# Patient Record
Sex: Male | Born: 1935 | Race: White | Hispanic: No | Marital: Married | Smoking: Never smoker
Health system: Southern US, Community
[De-identification: ages and names within clinical notes are randomized; demographics above are authoritative.]

## PROBLEM LIST (undated history)

## (undated) DIAGNOSIS — I4891 Unspecified atrial fibrillation: Secondary | ICD-10-CM

## (undated) DIAGNOSIS — I639 Cerebral infarction, unspecified: Secondary | ICD-10-CM

## (undated) DIAGNOSIS — I499 Cardiac arrhythmia, unspecified: Secondary | ICD-10-CM

## (undated) DIAGNOSIS — E119 Type 2 diabetes mellitus without complications: Secondary | ICD-10-CM

## (undated) DIAGNOSIS — R413 Other amnesia: Secondary | ICD-10-CM

## (undated) DIAGNOSIS — I472 Ventricular tachycardia, unspecified: Secondary | ICD-10-CM

## (undated) DIAGNOSIS — H9190 Unspecified hearing loss, unspecified ear: Secondary | ICD-10-CM

## (undated) DIAGNOSIS — S065X9A Traumatic subdural hemorrhage with loss of consciousness of unspecified duration, initial encounter: Secondary | ICD-10-CM

## (undated) DIAGNOSIS — E785 Hyperlipidemia, unspecified: Secondary | ICD-10-CM

## (undated) DIAGNOSIS — I509 Heart failure, unspecified: Secondary | ICD-10-CM

## (undated) DIAGNOSIS — R51 Headache: Secondary | ICD-10-CM

## (undated) DIAGNOSIS — N400 Enlarged prostate without lower urinary tract symptoms: Secondary | ICD-10-CM

## (undated) DIAGNOSIS — F0390 Unspecified dementia without behavioral disturbance: Secondary | ICD-10-CM

## (undated) DIAGNOSIS — Z95810 Presence of automatic (implantable) cardiac defibrillator: Secondary | ICD-10-CM

## (undated) DIAGNOSIS — I2510 Atherosclerotic heart disease of native coronary artery without angina pectoris: Secondary | ICD-10-CM

## (undated) DIAGNOSIS — I6529 Occlusion and stenosis of unspecified carotid artery: Secondary | ICD-10-CM

## (undated) DIAGNOSIS — I10 Essential (primary) hypertension: Secondary | ICD-10-CM

## (undated) DIAGNOSIS — M1990 Unspecified osteoarthritis, unspecified site: Secondary | ICD-10-CM

## (undated) DIAGNOSIS — R519 Headache, unspecified: Secondary | ICD-10-CM

## (undated) DIAGNOSIS — Z950 Presence of cardiac pacemaker: Secondary | ICD-10-CM

## (undated) HISTORY — DX: Heart failure, unspecified: I50.9

## (undated) HISTORY — DX: Ventricular tachycardia, unspecified: I47.20

## (undated) HISTORY — DX: Benign prostatic hyperplasia without lower urinary tract symptoms: N40.0

## (undated) HISTORY — DX: Atherosclerotic heart disease of native coronary artery without angina pectoris: I25.10

## (undated) HISTORY — DX: Occlusion and stenosis of unspecified carotid artery: I65.29

## (undated) HISTORY — DX: Cerebral infarction, unspecified: I63.9

## (undated) HISTORY — DX: Unspecified dementia, unspecified severity, without behavioral disturbance, psychotic disturbance, mood disturbance, and anxiety: F03.90

## (undated) HISTORY — DX: Traumatic subdural hemorrhage with loss of consciousness of unspecified duration, initial encounter: S06.5X9A

## (undated) HISTORY — DX: Type 2 diabetes mellitus without complications: E11.9

## (undated) HISTORY — PX: HEMORRHOID SURGERY: SHX153

## (undated) HISTORY — DX: Unspecified hearing loss, unspecified ear: H91.90

## (undated) HISTORY — DX: Other amnesia: R41.3

## (undated) HISTORY — DX: Unspecified atrial fibrillation: I48.91

## (undated) HISTORY — DX: Essential (primary) hypertension: I10

## (undated) HISTORY — DX: Hyperlipidemia, unspecified: E78.5

## (undated) HISTORY — PX: PACEMAKER INSERTION: SHX728

## (undated) HISTORY — DX: Gilbert syndrome: E80.4

## (undated) HISTORY — DX: Ventricular tachycardia: I47.2

---

## 1996-01-10 HISTORY — PX: CORONARY ARTERY BYPASS GRAFT: SHX141

## 1997-06-22 ENCOUNTER — Other Ambulatory Visit: Admission: RE | Admit: 1997-06-22 | Discharge: 1997-06-22 | Payer: Self-pay | Admitting: Cardiology

## 2003-01-10 HISTORY — PX: BRAIN SURGERY: SHX531

## 2003-07-23 ENCOUNTER — Encounter (INDEPENDENT_AMBULATORY_CARE_PROVIDER_SITE_OTHER): Payer: Self-pay | Admitting: Cardiology

## 2003-07-23 ENCOUNTER — Ambulatory Visit (HOSPITAL_COMMUNITY): Admission: RE | Admit: 2003-07-23 | Discharge: 2003-07-23 | Payer: Self-pay | Admitting: Cardiology

## 2003-11-10 DIAGNOSIS — S065XAA Traumatic subdural hemorrhage with loss of consciousness status unknown, initial encounter: Secondary | ICD-10-CM

## 2003-11-10 DIAGNOSIS — S065X9A Traumatic subdural hemorrhage with loss of consciousness of unspecified duration, initial encounter: Secondary | ICD-10-CM

## 2003-11-10 HISTORY — DX: Traumatic subdural hemorrhage with loss of consciousness of unspecified duration, initial encounter: S06.5X9A

## 2003-11-10 HISTORY — DX: Traumatic subdural hemorrhage with loss of consciousness status unknown, initial encounter: S06.5XAA

## 2003-11-13 ENCOUNTER — Inpatient Hospital Stay (HOSPITAL_COMMUNITY): Admission: EM | Admit: 2003-11-13 | Discharge: 2003-11-18 | Payer: Self-pay | Admitting: Emergency Medicine

## 2003-11-25 ENCOUNTER — Inpatient Hospital Stay (HOSPITAL_COMMUNITY): Admission: EM | Admit: 2003-11-25 | Discharge: 2003-11-27 | Payer: Self-pay | Admitting: Emergency Medicine

## 2003-11-25 ENCOUNTER — Ambulatory Visit (HOSPITAL_COMMUNITY): Admission: RE | Admit: 2003-11-25 | Discharge: 2003-11-25 | Payer: Self-pay | Admitting: Neurosurgery

## 2003-11-26 ENCOUNTER — Encounter (INDEPENDENT_AMBULATORY_CARE_PROVIDER_SITE_OTHER): Payer: Self-pay | Admitting: Cardiology

## 2003-12-04 ENCOUNTER — Inpatient Hospital Stay (HOSPITAL_COMMUNITY): Admission: EM | Admit: 2003-12-04 | Discharge: 2003-12-15 | Payer: Self-pay | Admitting: Emergency Medicine

## 2003-12-04 ENCOUNTER — Encounter (INDEPENDENT_AMBULATORY_CARE_PROVIDER_SITE_OTHER): Payer: Self-pay | Admitting: *Deleted

## 2003-12-15 ENCOUNTER — Ambulatory Visit: Payer: Self-pay | Admitting: Physical Medicine & Rehabilitation

## 2003-12-15 ENCOUNTER — Inpatient Hospital Stay (HOSPITAL_COMMUNITY)
Admission: RE | Admit: 2003-12-15 | Discharge: 2003-12-25 | Payer: Self-pay | Admitting: Physical Medicine & Rehabilitation

## 2004-01-16 ENCOUNTER — Emergency Department (HOSPITAL_COMMUNITY): Admission: EM | Admit: 2004-01-16 | Discharge: 2004-01-17 | Payer: Self-pay | Admitting: Emergency Medicine

## 2004-01-27 ENCOUNTER — Ambulatory Visit (HOSPITAL_COMMUNITY): Admission: RE | Admit: 2004-01-27 | Discharge: 2004-01-27 | Payer: Self-pay | Admitting: Neurological Surgery

## 2004-02-01 ENCOUNTER — Encounter
Admission: RE | Admit: 2004-02-01 | Discharge: 2004-04-28 | Payer: Self-pay | Admitting: Physical Medicine & Rehabilitation

## 2004-02-03 ENCOUNTER — Ambulatory Visit: Payer: Self-pay | Admitting: Physical Medicine & Rehabilitation

## 2004-02-17 ENCOUNTER — Encounter
Admission: RE | Admit: 2004-02-17 | Discharge: 2004-03-09 | Payer: Self-pay | Admitting: Physical Medicine & Rehabilitation

## 2004-04-28 ENCOUNTER — Encounter
Admission: RE | Admit: 2004-04-28 | Discharge: 2004-07-27 | Payer: Self-pay | Admitting: Physical Medicine & Rehabilitation

## 2004-05-02 ENCOUNTER — Ambulatory Visit: Payer: Self-pay | Admitting: Physical Medicine & Rehabilitation

## 2004-07-29 ENCOUNTER — Ambulatory Visit: Payer: Self-pay | Admitting: Physical Medicine & Rehabilitation

## 2004-07-29 ENCOUNTER — Encounter
Admission: RE | Admit: 2004-07-29 | Discharge: 2004-10-27 | Payer: Self-pay | Admitting: Physical Medicine & Rehabilitation

## 2006-04-23 IMAGING — CT CT HEAD W/O CM
1 of 2 series · 13 of 30 positions shown, 17 images · IV contrast (agent unspecified)
Comparison: none

CLINICAL DATA: Subdural hemorrhage; nontraumatic
 HEAD CT WITHOUT CONTRAST:
 Comparison none. 
 A right extraaxial surgical drain is noted in place.  There are bilateral extraaxial fluid collections, left more so than right having features consistent with acute on chronic subdural hematomas.  Approximately 2 mm of left to right midline shift is apparent on the current study.  Cerebellar atrophy noted.  There is no evidence for hydrocephalus.  No definite CT evidence of acute ischemia is identified.

[Series 2: brain · axial · 0.47mm/px · z∈[+145,+279]mm · 13 of 48 slices shown, 17 images]
[im 4/48  brain]
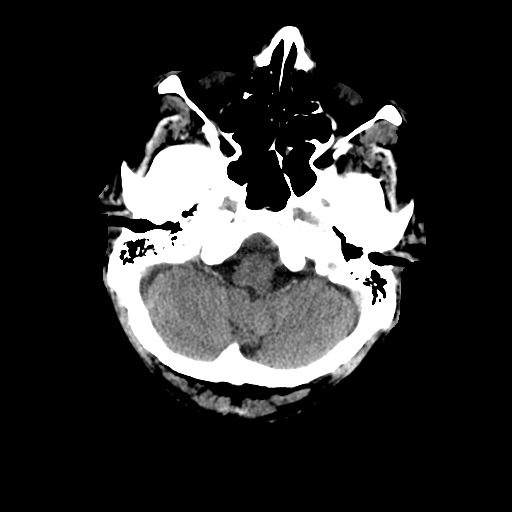
[im 4/48  bone]
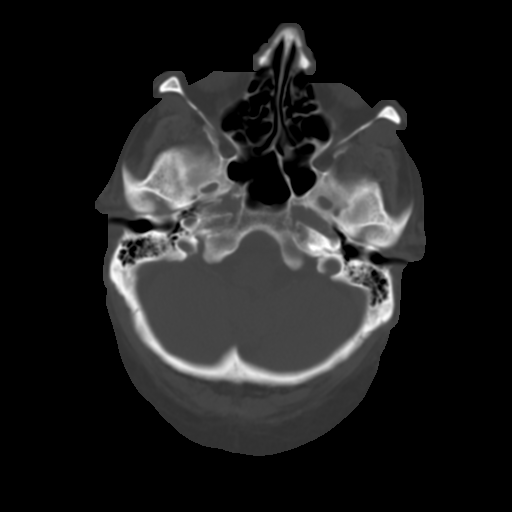
[im 7/48  brain]
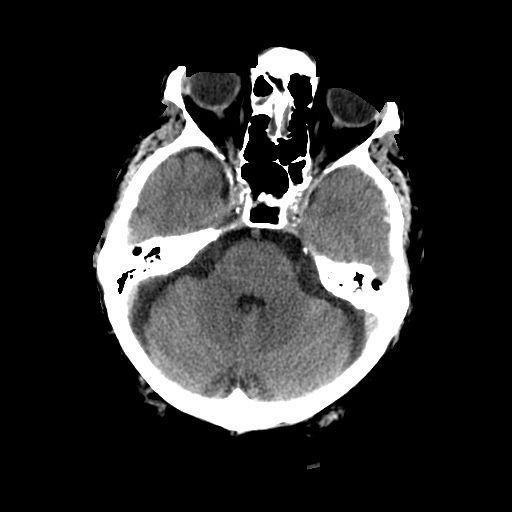
[im 11/48  brain]
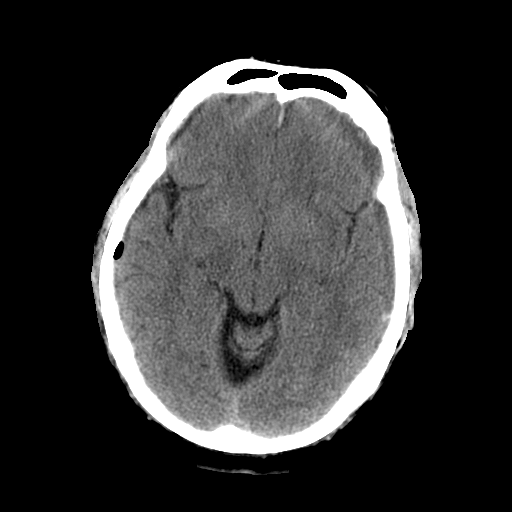
[im 14/48  brain]
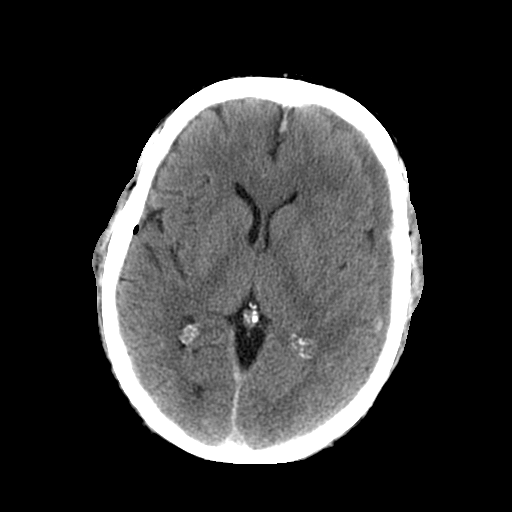
[im 17/48  brain]
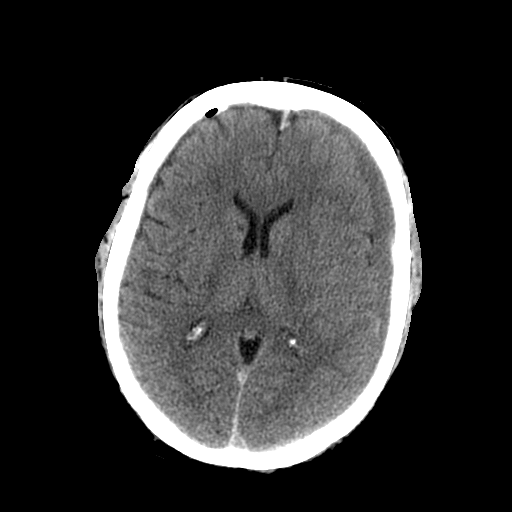
[im 17/48  bone]
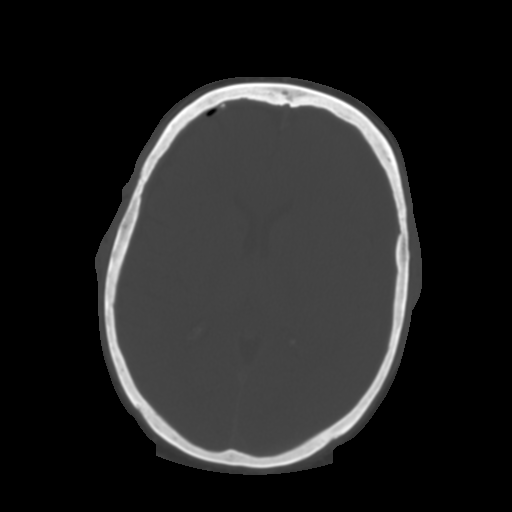
[im 21/48  brain]
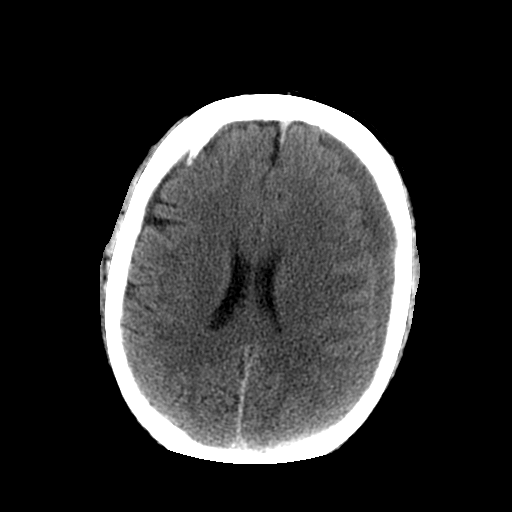
[im 24/48  brain]
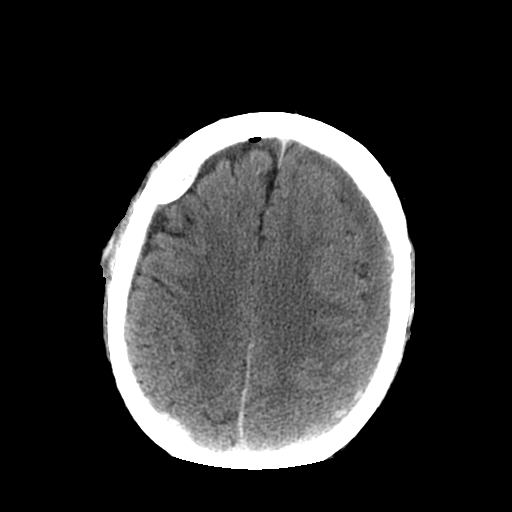
[im 27/48  brain]
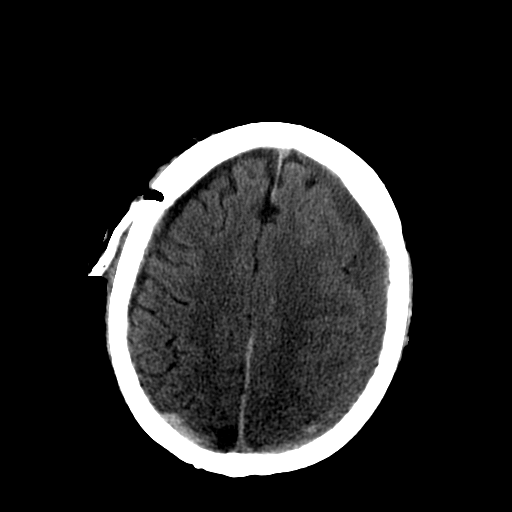
[im 31/48  brain]
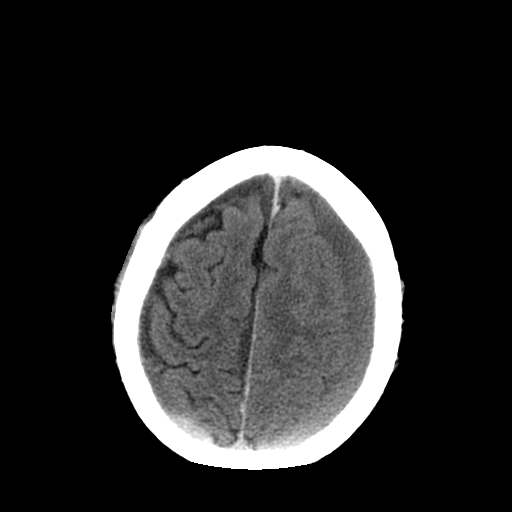
[im 31/48  bone]
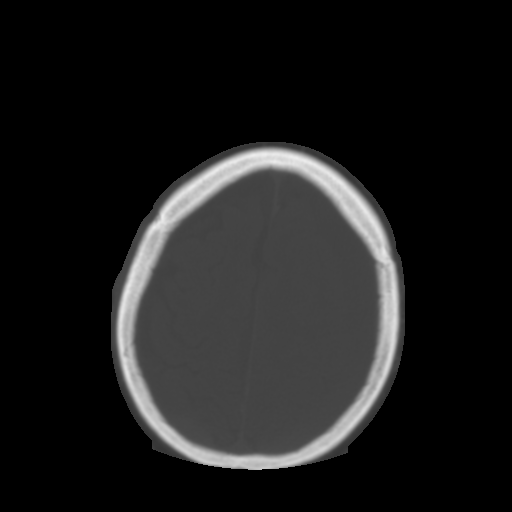
[im 34/48  brain]
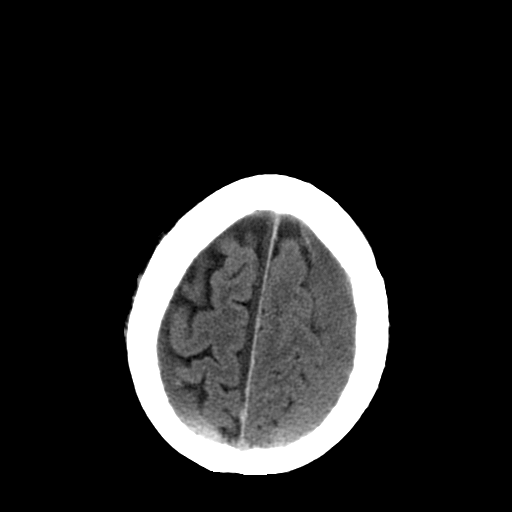
[im 37/48  brain]
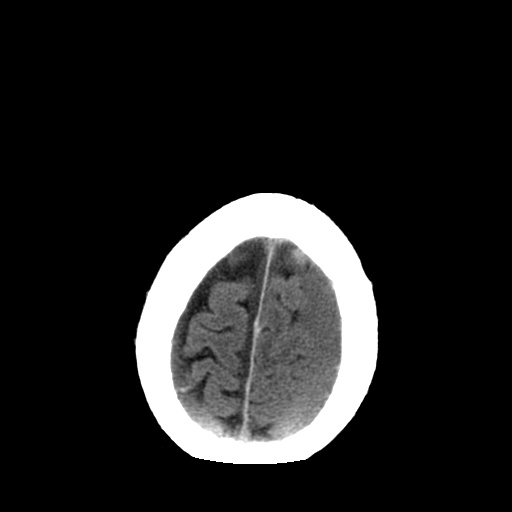
[im 41/48  brain]
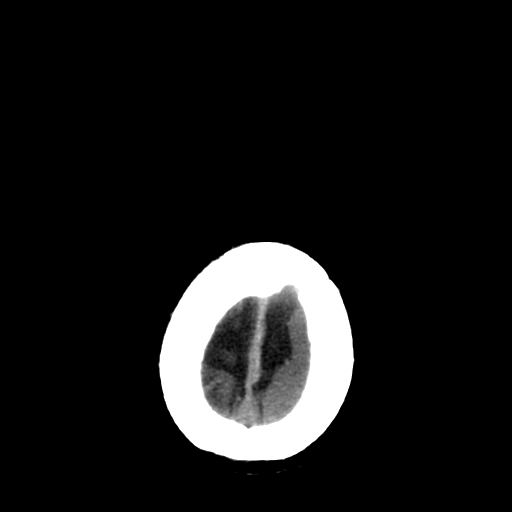
[im 44/48  brain]
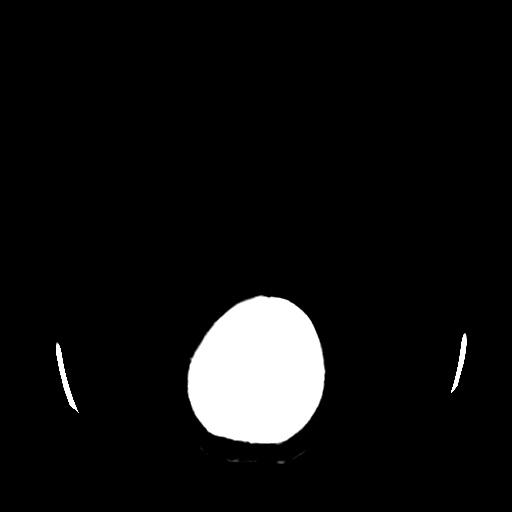
[im 44/48  bone]
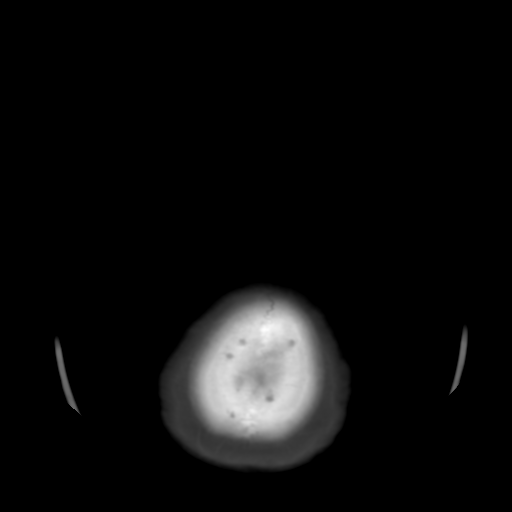

[13 of 30 positions shown; findings below may reference images not displayed]

IMPRESSION: 1.  Bilateral subdural hematomas having acute on chronic features.  There has been surgical drain placement in the right hematoma.  
 2.  Approximately 2 mm of left to right midline shift.

## 2006-04-24 IMAGING — CT CT HEAD W/O CM
1 of 2 series · 13 of 30 positions shown, 17 images · non-contrast
Comparison: 11/17/03.

CLINICAL DATA: Subdural hematoma.

[Series 2: brain · axial · 0.47mm/px · z∈[+169,+309]mm · 13 of 32 slices shown, 17 images]
[im 3/32  brain]
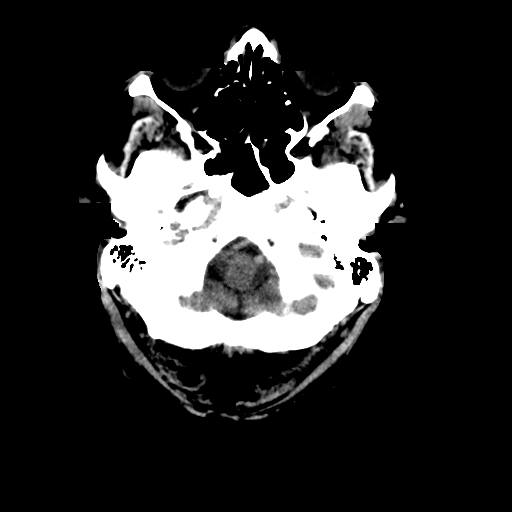
[im 3/32  bone]
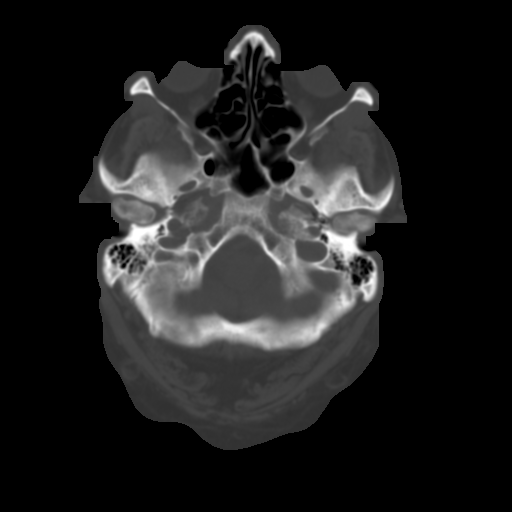
[im 5/32  brain]
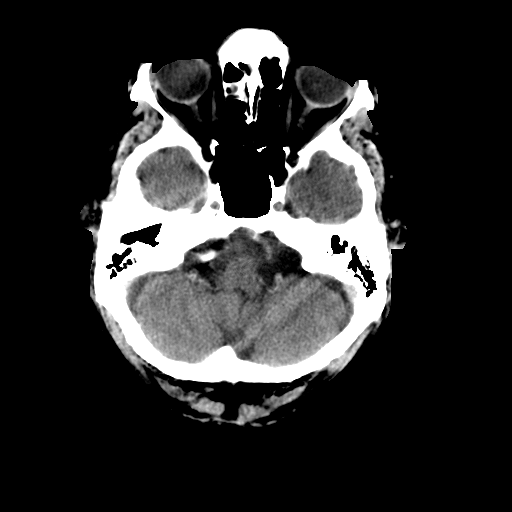
[im 7/32  brain]
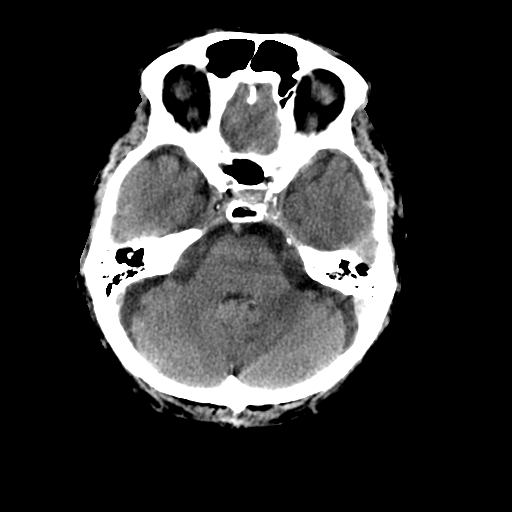
[im 9/32  brain]
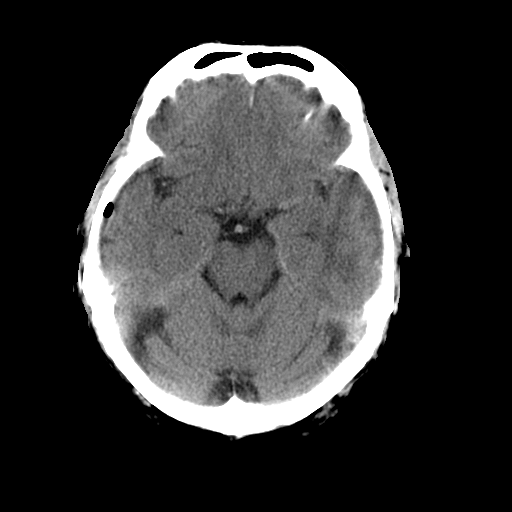
[im 12/32  brain]
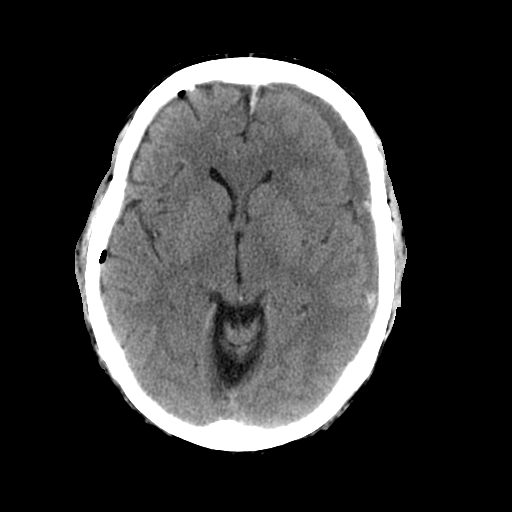
[im 12/32  bone]
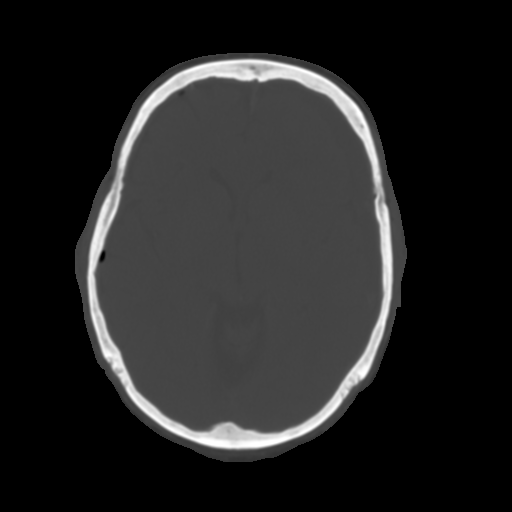
[im 14/32  brain]
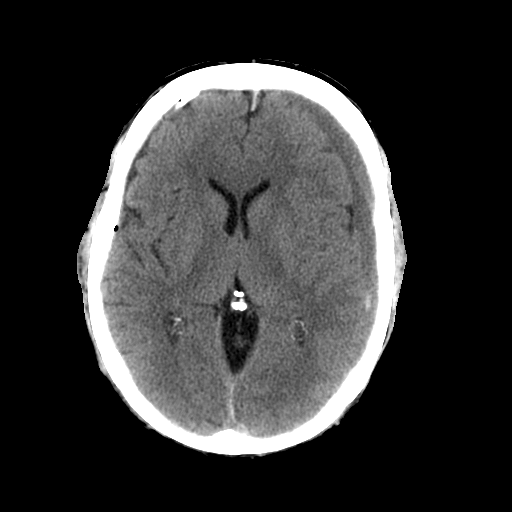
[im 16/32  brain]
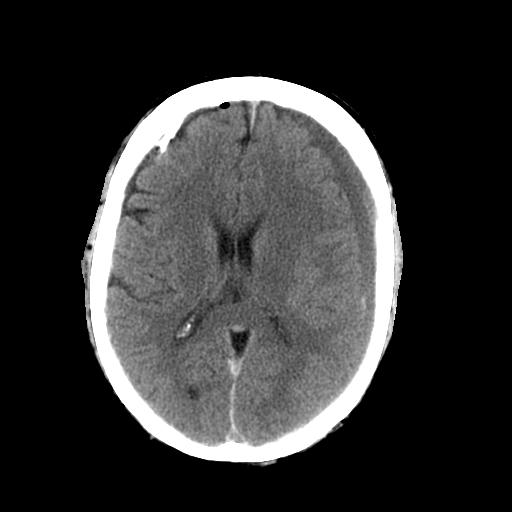
[im 18/32  brain]
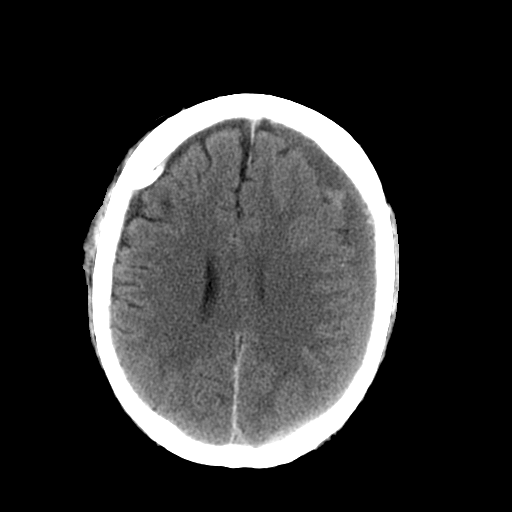
[im 20/32  brain]
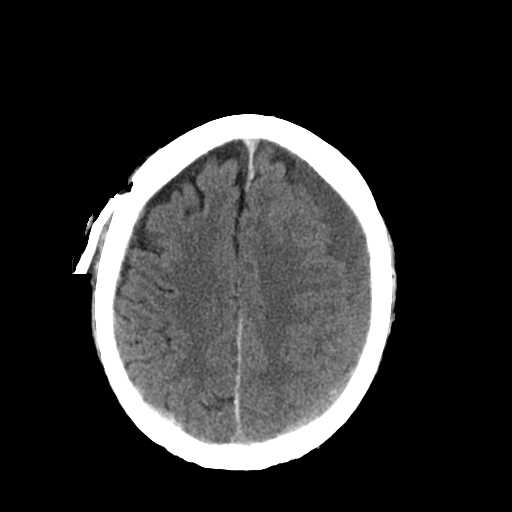
[im 20/32  bone]
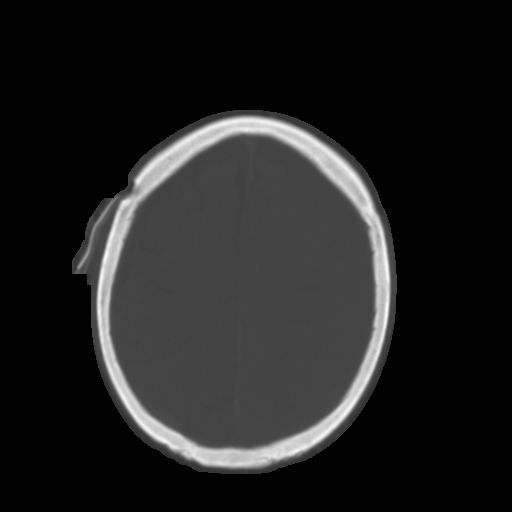
[im 23/32  brain]
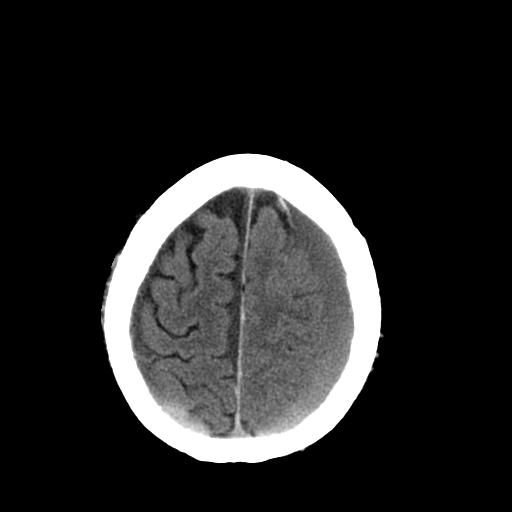
[im 25/32  brain]
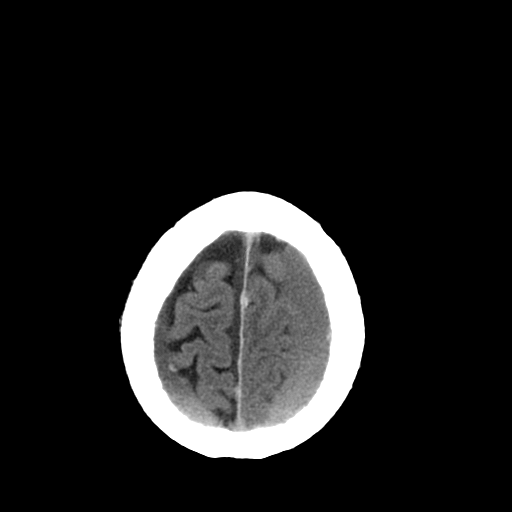
[im 27/32  brain]
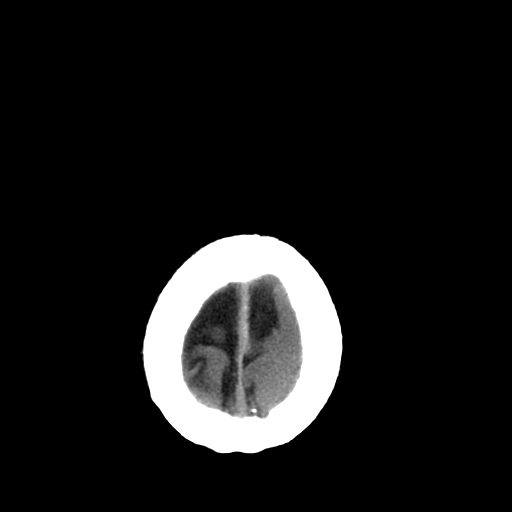
[im 29/32  brain]
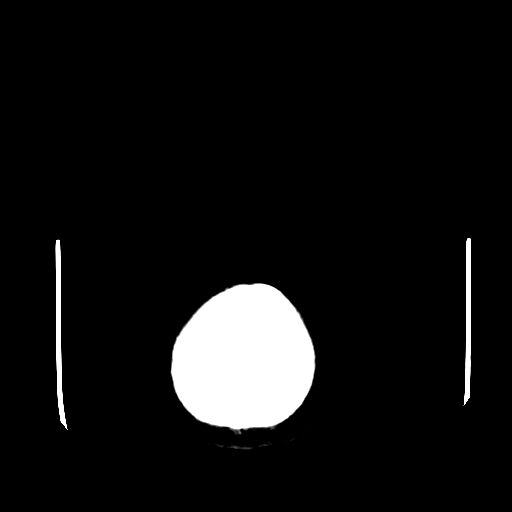
[im 29/32  bone]
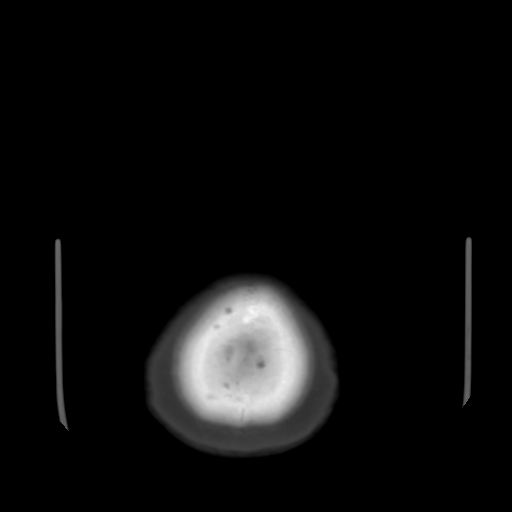

[13 of 30 positions shown; findings below may reference images not displayed]

CT HEAD W/O CONTRAST:
 Routine uninfused CT scanning of the brain shows the right subdural drain still in place.  A tiny amount of subdural fluid on the right is stable. Acute on chronic left-sided subdural hematoma extends over the convexity but demonstrates no substantial change in the interval.  The mild left to right midline shift is stable.
 No evidence for new  hemorrhage or CT evidence for acute ischemia.
IMPRESSION: No substantial change in exam.

## 2008-01-29 ENCOUNTER — Encounter: Admission: RE | Admit: 2008-01-29 | Discharge: 2008-01-29 | Payer: Self-pay | Admitting: Internal Medicine

## 2008-09-25 ENCOUNTER — Ambulatory Visit: Payer: Self-pay | Admitting: Internal Medicine

## 2008-09-25 ENCOUNTER — Inpatient Hospital Stay (HOSPITAL_COMMUNITY): Admission: EM | Admit: 2008-09-25 | Discharge: 2008-10-03 | Payer: Self-pay | Admitting: Emergency Medicine

## 2008-09-26 ENCOUNTER — Encounter (INDEPENDENT_AMBULATORY_CARE_PROVIDER_SITE_OTHER): Payer: Self-pay | Admitting: Cardiology

## 2008-10-01 ENCOUNTER — Encounter: Payer: Self-pay | Admitting: Internal Medicine

## 2008-10-08 ENCOUNTER — Encounter: Payer: Self-pay | Admitting: Internal Medicine

## 2008-10-14 ENCOUNTER — Ambulatory Visit: Payer: Self-pay

## 2008-10-14 ENCOUNTER — Encounter: Payer: Self-pay | Admitting: Internal Medicine

## 2008-10-22 ENCOUNTER — Ambulatory Visit (HOSPITAL_BASED_OUTPATIENT_CLINIC_OR_DEPARTMENT_OTHER): Admission: RE | Admit: 2008-10-22 | Discharge: 2008-10-22 | Payer: Self-pay | Admitting: Internal Medicine

## 2008-10-24 ENCOUNTER — Ambulatory Visit: Payer: Self-pay | Admitting: Internal Medicine

## 2008-11-09 ENCOUNTER — Encounter: Payer: Self-pay | Admitting: Internal Medicine

## 2008-11-14 ENCOUNTER — Emergency Department (HOSPITAL_COMMUNITY): Admission: EM | Admit: 2008-11-14 | Discharge: 2008-11-14 | Payer: Self-pay | Admitting: Emergency Medicine

## 2009-01-12 ENCOUNTER — Ambulatory Visit: Payer: Self-pay | Admitting: Internal Medicine

## 2009-01-12 DIAGNOSIS — I10 Essential (primary) hypertension: Secondary | ICD-10-CM | POA: Insufficient documentation

## 2009-01-12 DIAGNOSIS — I472 Ventricular tachycardia: Secondary | ICD-10-CM

## 2009-01-12 DIAGNOSIS — I495 Sick sinus syndrome: Secondary | ICD-10-CM | POA: Insufficient documentation

## 2009-01-12 DIAGNOSIS — I2589 Other forms of chronic ischemic heart disease: Secondary | ICD-10-CM

## 2009-01-12 DIAGNOSIS — E119 Type 2 diabetes mellitus without complications: Secondary | ICD-10-CM | POA: Insufficient documentation

## 2009-01-13 ENCOUNTER — Telehealth: Payer: Self-pay | Admitting: Internal Medicine

## 2009-01-14 ENCOUNTER — Telehealth (INDEPENDENT_AMBULATORY_CARE_PROVIDER_SITE_OTHER): Payer: Self-pay | Admitting: *Deleted

## 2009-01-14 ENCOUNTER — Telehealth: Payer: Self-pay | Admitting: Internal Medicine

## 2009-01-14 LAB — CONVERTED CEMR LAB
AST: 29 units/L (ref 0–37)
Albumin: 3.5 g/dL (ref 3.5–5.2)
TSH: 1.76 microintl units/mL (ref 0.35–5.50)
Total Bilirubin: 1.5 mg/dL — ABNORMAL HIGH (ref 0.3–1.2)
Total Protein: 7 g/dL (ref 6.0–8.3)

## 2009-02-09 ENCOUNTER — Encounter: Payer: Self-pay | Admitting: Internal Medicine

## 2009-02-21 ENCOUNTER — Inpatient Hospital Stay (HOSPITAL_COMMUNITY): Admission: EM | Admit: 2009-02-21 | Discharge: 2009-02-27 | Payer: Self-pay | Admitting: Emergency Medicine

## 2009-02-22 ENCOUNTER — Encounter (INDEPENDENT_AMBULATORY_CARE_PROVIDER_SITE_OTHER): Payer: Self-pay | Admitting: Neurology

## 2009-02-25 ENCOUNTER — Ambulatory Visit: Payer: Self-pay | Admitting: Physical Medicine & Rehabilitation

## 2009-02-27 ENCOUNTER — Inpatient Hospital Stay (HOSPITAL_COMMUNITY)
Admission: EM | Admit: 2009-02-27 | Discharge: 2009-03-06 | Payer: Self-pay | Admitting: Physical Medicine & Rehabilitation

## 2009-02-27 ENCOUNTER — Ambulatory Visit: Payer: Self-pay | Admitting: Physical Medicine & Rehabilitation

## 2009-04-07 ENCOUNTER — Telehealth: Payer: Self-pay | Admitting: Internal Medicine

## 2009-06-11 ENCOUNTER — Encounter: Admission: RE | Admit: 2009-06-11 | Discharge: 2009-06-11 | Payer: Self-pay | Admitting: Neurology

## 2009-09-14 ENCOUNTER — Ambulatory Visit: Payer: Self-pay | Admitting: Internal Medicine

## 2010-02-08 NOTE — Cardiovascular Report (Signed)
Summary: Office Visit   Office Visit   Imported By: Roderic Ovens 09/21/2009 11:16:18  _____________________________________________________________________  External Attachment:    Type:   Image     Comment:   External Document

## 2010-02-08 NOTE — Progress Notes (Signed)
Summary: returning call  Phone Note Call from Patient Call back at Home Phone [phone]   Caller: Spouse Reason for Call: Talk to Nurse Summary of Call: returning call Initial call taken by: Migdalia Dk,  January 14, 2009 3:14 PM  Follow-up for Phone Call        Pt aware of lab results

## 2010-02-08 NOTE — Cardiovascular Report (Signed)
Summary: Office Visit   Office Visit   Imported By: Roderic Ovens 01/21/2009 10:25:55  _____________________________________________________________________  External Attachment:    Type:   Image     Comment:   External Document

## 2010-02-08 NOTE — Consult Note (Signed)
Summary: Dr. Leeroy [facility]ock [facility]  Dr[facility]er[facility]   Imported By: Marylou Mccoy 06/08/2009 16:17:44  _____________________________________________________________________  External Attachment:    Type:   Image     Comment:   External Document

## 2010-02-08 NOTE — Progress Notes (Signed)
Summary: MED  Phone Note Call from Patient Call back at Home Phone [phone]   Caller: Spouse Reason for Call: Talk to Nurse Summary of Call: PT IS ON COREG 3.125 MG, SPOKE WITH NURSE YESTERDAY AND SHE NEEDED TO KNOW STRENGTH  Initial call taken by: Migdalia Dk,  January 13, 2009 10:07 AM  Follow-up for Phone Call        I called the pt's wife and let her know we had recieved the message. I will forward to [facility]- RN for Dr. Graciela Husbands in case she needed this info.  Follow-up by: Sherri Rad, RN, BSN,  January 13, 2009 10:22 AM

## 2010-02-08 NOTE — Progress Notes (Signed)
Summary: refill==dosage change  Phone Note Refill Request Call back at Home Phone [phone] Message from:  Patient  Refills Requested: Medication #1:  COREG 6.25 MG TABS two times a day   Supply Requested: 1 month [facility] [address] [place]  Patient states there was a recent change in dosage which needs to be addressed   Method Requested: Telephone to Pharmacy Initial call taken by: Burnard Leigh,  April 07, 2009 11:21 AM  Follow-up for Phone Call        per pt OV 01/12/2009 pt titrated up from 3.25mg  two times a day to 6.25 two times a day.  Change already updated.  Rx refilled Follow-up by: Judithe Modest CMA,  April 08, 2009 8:45 AM    Prescriptions: COREG 6.25 MG TABS (CARVEDILOL) two times a day  #60 x 6   Entered by:   Judithe Modest CMA   Authorized by:   Nathen May, MD, [facility]   Signed by:   Judithe Modest CMA on 04/08/2009   Method used:   Electronically to        [facility] [address] [id number] (retail)       [address] [place], [place]  [address]       Ph: [phone]       Fax: [phone]   RxID:   [id number]

## 2010-02-08 NOTE — Progress Notes (Signed)
  Phone Note Outgoing Call   Call placed by: Duncan Dull, RN, BSN,  January 14, 2009 11:14 AM Call placed to: Patient Summary of Call: Called patient and left message on machine  To discuss labs Duncan Dull, RN, BSN  January 14, 2009 11:14 AM   Follow-up for Phone Call        Pt aware of results

## 2010-02-08 NOTE — Assessment & Plan Note (Signed)
Summary: pc2 guidant/sl   CC:  pacer check.  History of Present Illness:     [name]  is seen in followup for ventricular tachycardia and bradycardia in the setting of permanent atrial fibrillation. He is status post CRT-P. implantation. He has ischemic heart disease with prior bypass grafting with patent LIMA and a patent vein graft.  he was recently hospitalized following a syncopal episode. Upon EMS arrival he was noted to have wide complex tachycardia. Require some resuscitative effort he was brought to hospital. Ultimately amiodarone and beta blockers were added following insertion of a biventricular pacemaker.  he has tolerated these medications since he had been home. He has had a couple stumbles and slipped on the ice but no recurrent syncope. He has had no nausea his breathing is stable and has been no peripheral edema or chest pain. He does not recall having seen Dr. telemetry following discharge (which I'm sure he has).  Current Medications (verified): 1)  Triamterene-Hctz 37.5-25 Mg Tabs (Triamterene-Hctz) .... One Half Tuesday and Saturday Only 2)  Metformin Hcl 500 Mg Tabs (Metformin Hcl) .... One By Mouth Daily 3)  Folic Acid 1 Mg Tabs (Folic Acid) .... One By Mouth Two Times A Day 4)  Vytorin 10-40 Mg Tabs (Ezetimibe-Simvastatin) .... One Half By Mouth Daily 5)  Amiodarone Hcl 200 Mg Tabs (Amiodarone Hcl) .... Once Daily 6)  Coreg 3.125 Mg Tabs (Carvedilol) .... One By Mouth Two Times A Day 7)  Aspir-Low 81 Mg Tbec (Aspirin) .... One By Mouth Daily 8)  Fosamax 70 Mg Tabs (Alendronate Sodium) .... One By Mouth Weekly 9)  Eye Vitamins  Caps (Multiple Vitamins-Minerals) .... One By Mouth Two Times A Day 10)  Vitamin B-12 1000 Mcg Tabs (Cyanocobalamin) .... One By Mouth Daily 11)  Exelon 4.6 Mg/24hr Pt24 (Rivastigmine) .... One Patch Every 24 Hours  Allergies (verified): No Known Drug Allergies  Past History:  Past Medical History: Last updated: 01/12/2009  1. Type 2  diabetes.   2. November 2005 he had a spontaneous subdural hematoma while on       Coumadin then a few days later he did have a small stroke and then       a few days later he had a recurrent subdural hematoma so he had two       craniotomies over the course of this time.  He has not been on       anticoagulation over the last 4 years because of this.  He has been       maintained on aspirin only.   3. He does have permanent atrial fibrillation.   4. Hypertension.   5. Coronary artery disease status post coronary artery bypass grafting       in 1998.   6. Hyperlipidemia.   7. Benign prostatic hypertrophy.   8. History of hemorrhoidectomy.   9. Decreased hearing.   10.Gilbert syndrome.   11.History of a carotid occlusion.   12.Dementia diagnosed in the last year.      Past Surgical History: Last updated: 01/12/2009  Hemorrhoidectomy   Family History: Last updated: 01/12/2009  Mother had coronary artery disease.  Father died of   cancer.   Social History: Last updated: 01/12/2009   He is married.  His wife, Cordelia Pen, and granddaughter are   at the bedside.  He is a full code status and this is confirmed today   with the patient and family members.  No tobacco, no alcohol, no drug  use history.  He is a retired principal.      Vital Signs:  Patient profile:   77 year old male Height:      70 inches Weight:      244 pounds Pulse rate:   71 / minute Pulse rhythm:   regular BP sitting:   139 / 70  (left arm) Cuff size:   large  Vitals Entered By: Judithe Modest CMA (January 12, 2009 3:04 PM)   PPM Specifications Following MD:  Sherryl Manges, MD     [facility] Vendor:  [employer]     PPM Model Number:  H120     PPM Serial Number:  [id number] PPM DOI:  09/30/2008     PPM Implanting MD:  Sherryl Manges, MD  Lead 1    Location: RV     DOI: 09/30/2008     Model #: [id number]     Serial #: [id number]     Status: active Lead 2    Location: LV     DOI: 09/30/2008     Model #: [id number]      Serial #: [id number]     Status: active  Magnet Response Rate:  BOL 100 ERI  85  Indications:  Huston Foley with VT   PPM Follow Up Remote Check?  No Battery Voltage:  good V     Battery Est. Longevity:  8.5 years     Pacer Dependent:  No     Right Ventricle  Amplitude: 9.5 mV, Impedance: 620 ohms, Threshold: 0.6 V at 0.5 msec Left Ventricle  Amplitude: 9.5 mV, Impedance: 794 ohms, Threshold: 0.6 V at 0.5 msec  Episodes Coumadin:  No Ventricular High Rate:  1     Ventricular Pacing:  98%  Parameters Mode:  VVIR     Lower Rate Limit:  65     Upper Rate Limit:  120 Next Cardiology Appt Due:  09/09/2009 Tech Comments:  Device function normal.  Rate response adequate for the patient's level of activity.  1 VHR episode  lasting 6 seconds.  ROV 9/11 Dr. Graciela Husbands. Altha Harm, LPN  January 12, 2009 3:21 PM   Impression & Recommendations:  Problem # 1:  PACEMAKER, CRT-BS (ICD-V45.01) device function is normal it was reprogrammed to maximize longevity . Heart rate excursion is reasonable  Problem # 2:  VENTRICULAR TACHYCARDIA (ICD-427.1)  No intercurrent ventricular tachycardia is identified. We will plan to decrease his amiodarone from 400-200 mg a day.  We will check his amiodarone surveillence labs today His updated medication list for this problem includes:    Amiodarone Hcl 200 Mg Tabs (Amiodarone hcl) ..... Once daily    Coreg 3.125 Mg Tabs (Carvedilol) ..... One by mouth two times a day    Aspir-low 81 Mg Tbec (Aspirin) ..... One by mouth daily  Problem # 3:  CARDIOMYOPATHY, ISCHEMIC (ICD-414.8)  continue him on his current medications. I will take the liberty of increasing his carvedilol from 3-6.25 mg twice daily. H  Orders: TLB-TSH (Thyroid Stimulating Hormone) (84443-TSH) TLB-Hepatic/Liver Function Pnl (80076-HEPATIC)  Problem # 4:  ATRIAL FIBRILLATION-PERMANENT (ICD-427.31)  He is not on Coumadin secondary to previous subdural hematoma    Patient Instructions: 1)  Your  physician has recommended you make the following change in your medication: TAKE AMIODARONE 200MG  DAILY. TAKE COREG (CARVEDILOL) 6.25MG  TWICE DAILY.  2)  Your physician recommends that you HAVE LABS TODAY: TSH, LFT

## 2010-02-08 NOTE — Assessment & Plan Note (Signed)
Summary: PC2   History of Present Illness:     [name]  is seen in followup for ventricular tachycardia for which he is treated with amiodarone and bradycardia in the setting of permanent atrial fibrillation. He is status post CRT-P. implantation   . He has ischemic heart disease with prior bypass grafting with patent LIMA and a patent vein graft.  Functional status is stable;  he has chronic edema  He has significant daytime somnolence but has not been able to tolerate sleep apnea therapies         Current Medications (verified): 1)  Triamterene-Hctz 37.5-25 Mg Tabs (Triamterene-Hctz) .... Take 1/2 Tablet Every Day 2)  Metformin Hcl 500 Mg Tabs (Metformin Hcl) .... One By Mouth Daily 3)  Folic Acid 1 Mg Tabs (Folic Acid) .... One By Mouth Two Times A Day 4)  Amiodarone Hcl 200 Mg Tabs (Amiodarone Hcl) .... Once Daily 5)  Coreg 6.25 Mg Tabs (Carvedilol) .... Two Times A Day 6)  Aspir-Low 81 Mg Tbec (Aspirin) .... One By Mouth Daily 7)  Fosamax 70 Mg Tabs (Alendronate Sodium) .... One By Mouth Weekly 8)  Eye Vitamins  Caps (Multiple Vitamins-Minerals) .... One By Mouth Two Times A Day 9)  Vitamin B-12 1000 Mcg Tabs (Cyanocobalamin) .... One By Mouth Daily 10)  Exelon 4.6 Mg/24hr Pt24 (Rivastigmine) .... One Patch Every 24 Hours 11)  Namenda 10 Mg Tabs (Memantine Hcl) .... Take One Tablet Once Daily 12)  Zetia 10 Mg Tabs (Ezetimibe) .... Take One Tablet Once Daily 13)  Pravastatin Sodium 40 Mg Tabs (Pravastatin Sodium) .... Take One Tablet Once Daily  Allergies (verified): No Known Drug Allergies  Past History:  Past Medical History: Last updated: 01/12/2009  1. Type 2 diabetes.   2. November 2005 he had a spontaneous subdural hematoma while on       Coumadin then a few days later he did have a small stroke and then       a few days later he had a recurrent subdural hematoma so he had two       craniotomies over the course of this time.  He has not been on   anticoagulation over the last 4 years because of this.  He has been       maintained on aspirin only.   3. He does have permanent atrial fibrillation.   4. Hypertension.   5. Coronary artery disease status post coronary artery bypass grafting       in 1998.   6. Hyperlipidemia.   7. Benign prostatic hypertrophy.   8. History of hemorrhoidectomy.   9. Decreased hearing.   10.Gilbert syndrome.   11.History of a carotid occlusion.   12.Dementia diagnosed in the last year.      Past Surgical History: Last updated: 01/12/2009  Hemorrhoidectomy   Family History: Last updated: 01/12/2009  Mother had coronary artery disease.  Father died of   cancer.   Social History: Last updated: 01/12/2009   He is married.  His wife, Cordelia Pen, and granddaughter are   at the bedside.  He is a full code status and this is confirmed today   with the patient and family members.  No tobacco, no alcohol, no drug   use history.  He is a retired principal.      Vital Signs:  Patient profile:   77 year old male Height:      70 inches Weight:      241 pounds BMI:  34.70 Pulse rate:   70 / minute Pulse rhythm:   regular BP sitting:   114 / 64  (left arm) Cuff size:   large  Vitals Entered By: Judithe Modest CMA (September 14, 2009 12:01 PM)  Physical Exam  General:  morbidly obese in NAD Lungs:  clear Heart:  regular rate and rhtyyhm Abdomen:  soft actrive bs Extremities:  2+ edema Neurologic:  alert aond oriented   PPM Specifications Following MD:  Sherryl Manges, MD     PPM Vendor:  [facility] [employer]     PPM Model Number:  H120     PPM Serial Number:  [id number] PPM DOI:  09/30/2008     PPM Implanting MD:  Sherryl Manges, MD  Lead 1    Location: RV     DOI: 09/30/2008     Model #: 2841     Serial #: [id number]     Status: active Lead 2    Location: LV     DOI: 09/30/2008     Model #: [id number]     Serial #: [id number]     Status: active  Magnet Response Rate:  BOL 100 ERI  85  Indications:   Huston Foley with VT   PPM Follow Up Battery Voltage:  GOOD V     Battery Est. Longevity:  8 YRS     Pacer Dependent:  No     Right Ventricle  Amplitude: 9.5 mV, Impedance: 592 ohms, Threshold: 0.4 V at 0.5 msec Left Ventricle  Amplitude: 9.5 mV, Impedance: 820 ohms, Threshold: 1.2 V at 0.5 msec  Episodes MS Episodes:  0     Coumadin:  No Ventricular High Rate:  0     Ventricular Pacing:  99%  Parameters Mode:  VVIR     Lower Rate Limit:  65     Upper Rate Limit:  120 Next Cardiology Appt Due:  03/10/2010 Tech Comments:  NORMAL DEVICE FUNCTION.  NO EPISODES SINCE LAST CHECK.  NO CHANGES MADE. ROV IN 6 MTHS W/DEVICE CLINIC. Vella Kohler  September 14, 2009 12:07 PM  Impression & Recommendations:  Problem # 1:  CARDIOMYOPATHY, ISCHEMIC (ICD-414.8)  stable His updated medication list for this problem includes:    Triamterene-hctz 37.5-25 Mg Tabs (Triamterene-hctz) .Marland Kitchen... Take 1/2 tablet every day    Amiodarone Hcl 200 Mg Tabs (Amiodarone hcl) ..... Once daily    Coreg 6.25 Mg Tabs (Carvedilol) .Marland Kitchen..Marland Kitchen Two times a day    Aspir-low 81 Mg Tbec (Aspirin) ..... One by mouth daily  His updated medication list for this problem includes:    Triamterene-hctz 37.5-25 Mg Tabs (Triamterene-hctz) .Marland Kitchen... Take 1/2 tablet every day    Amiodarone Hcl 200 Mg Tabs (Amiodarone hcl) ..... Once daily    Coreg 6.25 Mg Tabs (Carvedilol) .Marland Kitchen..Marland Kitchen Two times a day    Aspir-low 81 Mg Tbec (Aspirin) ..... One by mouth daily  Problem # 2:  VENTRICULAR TACHYCARDIA (ICD-427.1) no intercurrent ventricular tachycardia; we will decrease his amiodarone from 7-5 days per week  His updated medication list for this problem includes:    Amiodarone Hcl 200 Mg Tabs (Amiodarone hcl) ..... Once daily    Coreg 6.25 Mg Tabs (Carvedilol) .Marland Kitchen..Marland Kitchen Two times a day    Aspir-low 81 Mg Tbec (Aspirin) ..... One by mouth daily  Problem # 3:  ATRIAL FIBRILLATION-PERMANENT (ICD-427.31) atrial fibrillation is permanent ;he is not on  anticoagulation per Dr. Donnie Aho His updated medication list for this problem includes:    Amiodarone Hcl 200  Mg Tabs (Amiodarone hcl) ..... Once daily    Coreg 6.25 Mg Tabs (Carvedilol) .Marland Kitchen..Marland Kitchen Two times a day    Aspir-low 81 Mg Tbec (Aspirin) ..... One by mouth daily  Patient Instructions: 1)  Your physician has recommended you make the following change in your medication:  TAKE AMIODARONE HCL 200 MG FOR 5 DAYS A WEEK.  2)  Your physician wants you to follow-up in: 6 MONTHS   You will receive a reminder letter in the mail two months in advance. If you don't receive a letter, please call our office to schedule the follow-up appointment.

## 2010-03-22 ENCOUNTER — Encounter: Payer: Self-pay | Admitting: Internal Medicine

## 2010-03-30 LAB — GLUCOSE, CAPILLARY
Glucose-Capillary: 100 mg/dL — ABNORMAL HIGH (ref 70–99)
Glucose-Capillary: 107 mg/dL — ABNORMAL HIGH (ref 70–99)
Glucose-Capillary: 108 mg/dL — ABNORMAL HIGH (ref 70–99)
Glucose-Capillary: 110 mg/dL — ABNORMAL HIGH (ref 70–99)
Glucose-Capillary: 111 mg/dL — ABNORMAL HIGH (ref 70–99)
Glucose-Capillary: 112 mg/dL — ABNORMAL HIGH (ref 70–99)
Glucose-Capillary: 112 mg/dL — ABNORMAL HIGH (ref 70–99)
Glucose-Capillary: 114 mg/dL — ABNORMAL HIGH (ref 70–99)
Glucose-Capillary: 114 mg/dL — ABNORMAL HIGH (ref 70–99)
Glucose-Capillary: 117 mg/dL — ABNORMAL HIGH (ref 70–99)
Glucose-Capillary: 120 mg/dL — ABNORMAL HIGH (ref 70–99)
Glucose-Capillary: 130 mg/dL — ABNORMAL HIGH (ref 70–99)
Glucose-Capillary: 133 mg/dL — ABNORMAL HIGH (ref 70–99)
Glucose-Capillary: 133 mg/dL — ABNORMAL HIGH (ref 70–99)
Glucose-Capillary: 133 mg/dL — ABNORMAL HIGH (ref 70–99)
Glucose-Capillary: 134 mg/dL — ABNORMAL HIGH (ref 70–99)
Glucose-Capillary: 140 mg/dL — ABNORMAL HIGH (ref 70–99)
Glucose-Capillary: 143 mg/dL — ABNORMAL HIGH (ref 70–99)
Glucose-Capillary: 144 mg/dL — ABNORMAL HIGH (ref 70–99)
Glucose-Capillary: 151 mg/dL — ABNORMAL HIGH (ref 70–99)
Glucose-Capillary: 162 mg/dL — ABNORMAL HIGH (ref 70–99)
Glucose-Capillary: 165 mg/dL — ABNORMAL HIGH (ref 70–99)
Glucose-Capillary: 169 mg/dL — ABNORMAL HIGH (ref 70–99)
Glucose-Capillary: 193 mg/dL — ABNORMAL HIGH (ref 70–99)
Glucose-Capillary: 196 mg/dL — ABNORMAL HIGH (ref 70–99)
Glucose-Capillary: 220 mg/dL — ABNORMAL HIGH (ref 70–99)
Glucose-Capillary: 94 mg/dL (ref 70–99)
Glucose-Capillary: 95 mg/dL (ref 70–99)

## 2010-03-30 LAB — COMPREHENSIVE METABOLIC PANEL
ALT: 32 U/L (ref 0–53)
AST: 27 U/L (ref 0–37)
AST: 30 U/L (ref 0–37)
Albumin: 3.1 g/dL — ABNORMAL LOW (ref 3.5–5.2)
CO2: 27 mEq/L (ref 19–32)
Calcium: 8.5 mg/dL (ref 8.4–10.5)
Calcium: 8.6 mg/dL (ref 8.4–10.5)
Chloride: 101 mEq/L (ref 96–112)
Creatinine, Ser: 1.14 mg/dL (ref 0.4–1.5)
Creatinine, Ser: 1.15 mg/dL (ref 0.4–1.5)
GFR calc Af Amer: >60 mL/min (ref >60)
GFR calc Af Amer: >60 mL/min (ref >60)
GFR calc non Af Amer: >60 mL/min (ref >60)
Sodium: 133 mEq/L — ABNORMAL LOW (ref 135–145)
Sodium: 135 mEq/L (ref 135–145)
Total Protein: 6.7 g/dL (ref 6.0–8.3)

## 2010-03-30 LAB — POCT CARDIAC MARKERS
CKMB, poc: <1 ng/mL — ABNORMAL LOW (ref 1.0–8.0)
Myoglobin, poc: 88.1 ng/mL (ref 12–200)
Troponin i, poc: <0.05 ng/mL (ref 0.00–0.09)

## 2010-03-30 LAB — DIFFERENTIAL
Basophils Absolute: 0 10*3/uL (ref 0.0–0.1)
Basophils Relative: 1 % (ref 0–1)
Eosinophils Absolute: 0.4 10*3/uL (ref 0.0–0.7)
Eosinophils Relative: 6 % — ABNORMAL HIGH (ref 0–5)
Eosinophils Relative: 7 % — ABNORMAL HIGH (ref 0–5)
Lymphocytes Relative: 21 % (ref 12–46)
Lymphocytes Relative: 24 % (ref 12–46)
Lymphs Abs: 1 10*3/uL (ref 0.7–4.0)
Lymphs Abs: 1.1 10*3/uL (ref 0.7–4.0)
Monocytes Absolute: 0.5 10*3/uL (ref 0.1–1.0)
Monocytes Absolute: 0.5 10*3/uL (ref 0.1–1.0)
Monocytes Relative: 10 % (ref 3–12)
Monocytes Relative: 11 % (ref 3–12)
Neutro Abs: 2.5 10*3/uL (ref 1.7–7.7)

## 2010-03-30 LAB — PROTIME-INR
INR: 0.96 (ref 0.00–1.49)
Prothrombin Time: 12.7 seconds (ref 11.6–15.2)

## 2010-03-30 LAB — BASIC METABOLIC PANEL
BUN: 12 mg/dL (ref 6–23)
Calcium: 9.1 mg/dL (ref 8.4–10.5)
Chloride: 99 mEq/L (ref 96–112)
GFR calc Af Amer: >60 mL/min (ref >60)
GFR calc non Af Amer: 55 mL/min — ABNORMAL LOW (ref >60)
Potassium: 4 mEq/L (ref 3.5–5.1)
Potassium: 4.2 mEq/L (ref 3.5–5.1)
Sodium: 132 mEq/L — ABNORMAL LOW (ref 135–145)
Sodium: 133 mEq/L — ABNORMAL LOW (ref 135–145)

## 2010-03-30 LAB — LIPID PANEL
Cholesterol: 111 mg/dL (ref 0–200)
HDL: 34 mg/dL — ABNORMAL LOW (ref >39)
LDL Cholesterol: 63 mg/dL (ref 0–99)
Total CHOL/HDL Ratio: 3.3 RATIO
Triglycerides: 69 mg/dL (ref <150)

## 2010-03-30 LAB — CULTURE, BLOOD (ROUTINE X 2): Culture: NO GROWTH

## 2010-03-30 LAB — CBC
HCT: 36.3 % — ABNORMAL LOW (ref 39.0–52.0)
HCT: 38.4 % — ABNORMAL LOW (ref 39.0–52.0)
Hemoglobin: 12.6 g/dL — ABNORMAL LOW (ref 13.0–17.0)
Hemoglobin: 13.3 g/dL (ref 13.0–17.0)
MCHC: 34.5 g/dL (ref 30.0–36.0)
MCHC: 34.8 g/dL (ref 30.0–36.0)
MCV: 98.1 fL (ref 78.0–100.0)
MCV: 98.3 fL (ref 78.0–100.0)
Platelets: 156 10*3/uL (ref 150–400)
Platelets: 184 10*3/uL (ref 150–400)
RBC: 3.8 MIL/uL — ABNORMAL LOW (ref 4.22–5.81)
RDW: 13.3 % (ref 11.5–15.5)
WBC: 5.1 10*3/uL (ref 4.0–10.5)
WBC: 6.6 10*3/uL (ref 4.0–10.5)

## 2010-03-30 LAB — URINALYSIS, MICROSCOPIC ONLY
Bilirubin Urine: NEGATIVE
Glucose, UA: NEGATIVE mg/dL
Ketones, ur: NEGATIVE mg/dL
Nitrite: POSITIVE — AB
pH: 6 (ref 5.0–8.0)

## 2010-03-30 LAB — URINALYSIS, ROUTINE W REFLEX MICROSCOPIC
Bilirubin Urine: NEGATIVE
Glucose, UA: NEGATIVE mg/dL
Hgb urine dipstick: NEGATIVE
Urobilinogen, UA: 1 mg/dL (ref 0.0–1.0)

## 2010-04-05 ENCOUNTER — Ambulatory Visit (INDEPENDENT_AMBULATORY_CARE_PROVIDER_SITE_OTHER): Payer: Medicare Other | Admitting: Internal Medicine

## 2010-04-05 ENCOUNTER — Encounter: Payer: Self-pay | Admitting: Internal Medicine

## 2010-04-05 DIAGNOSIS — I2510 Atherosclerotic heart disease of native coronary artery without angina pectoris: Secondary | ICD-10-CM

## 2010-04-05 DIAGNOSIS — I472 Ventricular tachycardia: Secondary | ICD-10-CM | POA: Insufficient documentation

## 2010-04-05 DIAGNOSIS — I4891 Unspecified atrial fibrillation: Secondary | ICD-10-CM | POA: Insufficient documentation

## 2010-04-05 DIAGNOSIS — I2589 Other forms of chronic ischemic heart disease: Secondary | ICD-10-CM

## 2010-04-05 DIAGNOSIS — Z950 Presence of cardiac pacemaker: Secondary | ICD-10-CM | POA: Insufficient documentation

## 2010-04-05 DIAGNOSIS — R609 Edema, unspecified: Secondary | ICD-10-CM | POA: Insufficient documentation

## 2010-04-05 DIAGNOSIS — I495 Sick sinus syndrome: Secondary | ICD-10-CM

## 2010-04-05 NOTE — Assessment & Plan Note (Signed)
The patient's device was interrogated.  The information was reviewed. No changes were made in the programming.    

## 2010-04-05 NOTE — Assessment & Plan Note (Signed)
Stable on current medications 

## 2010-04-05 NOTE — Assessment & Plan Note (Addendum)
Atrial fibrillation is Permanent. He is not a candidate for oral anticoagulation secondary to subdural hematoma. He is on aspirin. This raises the possibility of whether he should be considered for equiis Based on theAVERROES Trial which showed that this was a safer than aspirin and much more effective.

## 2010-04-05 NOTE — Patient Instructions (Signed)
Your physician recommends that you schedule a follow-up appointment in: 6 months with kristin and paula

## 2010-04-05 NOTE — Assessment & Plan Note (Signed)
No evidence of ventricular tachycardia is noted. Amiodarone surveillance laboratories are being followed by Dr. Sherrye Payor

## 2010-04-05 NOTE — Assessment & Plan Note (Signed)
The patient is paced 100% of the time Via CRT

## 2010-04-05 NOTE — Progress Notes (Signed)
HPI  John [name] Is seen in followup for ventricular tachycardia for which he is treated with amiodarone. He also has bradycardia in the setting of permanent atrial fibrillation. He is status post CRT-P implantation. This occurred in 2010.  At that time he was also found to have significant progression of his coronary disease. Dr. Excell Seltzer attempted PCI of his LAD; this was unsuccessful. He is status post CABG.  The patient denies chest pain Or nocturnal dyspnea.There have been no palpitations, lightheadedness or syncope. He has chronic peripheral edema and shortness of breath. He has significant sleep apnea but has been intolerant of mask therapy; he does use nocturnal oxygen with some relief and has been much improved with the loss of 50 pounds.   Past Medical History  Diagnosis Date  . DM2 (diabetes mellitus, type 2)   . Subdural hematoma 11/05  . Atrial fibrillation   . HTN (hypertension)   . CAD (coronary artery disease)     s/p CABG  . HLD (hyperlipidemia)   . BPH (benign prostatic hyperplasia)   . Decreased hearing   . Gilbert syndrome   . Carotid artery occlusion   . Dementia     Past Surgical History  Procedure Date  . Coronary artery bypass graft 1998  . Hemorrhoid surgery   . Pacemaker insertion     [employer]    Current Outpatient Prescriptions  Medication Sig Dispense Refill  . alendronate (FOSAMAX) 70 MG tablet Take 70 mg by mouth every 7 (seven) days. Take with a full glass of water on an empty stomach.      Marland Kitchen amiodarone (PACERONE) 200 MG tablet Take 200 mg by mouth daily. Take 5 days a week      . aspirin 81 MG tablet Take 81 mg by mouth daily.        . carvedilol (COREG) 6.25 MG tablet Take 6.25 mg by mouth 2 (two) times daily with a meal.        . Dietary Management Product (AXONA) packet Take 40 g by mouth daily.        Marland Kitchen docusate sodium (COLACE) 100 MG capsule Take 100 mg by mouth 3 (three) times daily.        . ergocalciferol (VITAMIN D2) 50000 UNITS  capsule Take 50,000 Units by mouth once a week.        . ezetimibe (ZETIA) 10 MG tablet Take 10 mg by mouth daily.        . folic acid (FOLVITE) 1 MG tablet Take 1 mg by mouth 2 (two) times daily.        . memantine (NAMENDA) 10 MG tablet Take 10 mg by mouth 2 (two) times daily.       . metFORMIN (GLUCOPHAGE) 500 MG tablet Take 500 mg by mouth daily with breakfast.        . Multiple Vitamins-Minerals (EYE VITAMINS) CAPS Take by mouth 2 (two) times daily.        . pravastatin (PRAVACHOL) 40 MG tablet Take 40 mg by mouth daily.        . rivastigmine (EXELON) 4.6 mg/24hr Place 1 patch onto the skin daily.        Marland Kitchen triamterene-hydrochlorothiazide (MAXZIDE-25) 37.5-25 MG per tablet Take 0.5 tablets by mouth daily.        . vitamin B-12 (CYANOCOBALAMIN) 1000 MCG tablet Take 1,000 mcg by mouth daily.          No Known Allergies  Review of Systems negative except from HPI  and PMH  Physical Exam Well developed and well nourished Morbidly obese in no acute distress HENT normal E scleral and icterus clear Neck Supple JVP flat; carotids brisk and full Clear to ausculation Regular rate and rhythm, no murmurs gallops or rub Soft with active bowel sounds No clubbing cyanosis; 2 peripheral edema Alert and oriented, grossly normal motor and sensory function Skin Warm and Dry  ECG  Assessment and  Plan

## 2010-04-05 NOTE — Assessment & Plan Note (Signed)
The patient has bilateral peripheral edema. I have been in contact with Dr. Sherrye Payor to ask whether a change in his diuretic is appropriate

## 2010-04-15 LAB — COMPREHENSIVE METABOLIC PANEL
ALT: 18 U/L (ref 0–53)
ALT: 18 U/L (ref 0–53)
ALT: 36 U/L (ref 0–53)
ALT: 78 U/L — ABNORMAL HIGH (ref 0–53)
AST: 16 U/L (ref 0–37)
AST: 19 U/L (ref 0–37)
AST: 19 U/L (ref 0–37)
AST: 20 U/L (ref 0–37)
Albumin: 2.9 g/dL — ABNORMAL LOW (ref 3.5–5.2)
Albumin: 3.4 g/dL — ABNORMAL LOW (ref 3.5–5.2)
Alkaline Phosphatase: 38 U/L — ABNORMAL LOW (ref 39–117)
Alkaline Phosphatase: 43 U/L (ref 39–117)
Alkaline Phosphatase: 43 U/L (ref 39–117)
Alkaline Phosphatase: 46 U/L (ref 39–117)
BUN: 11 mg/dL (ref 6–23)
BUN: 12 mg/dL (ref 6–23)
BUN: 12 mg/dL (ref 6–23)
BUN: 14 mg/dL (ref 6–23)
BUN: 14 mg/dL (ref 6–23)
CO2: 26 mEq/L (ref 19–32)
CO2: 26 mEq/L (ref 19–32)
CO2: 27 mEq/L (ref 19–32)
CO2: 28 mEq/L (ref 19–32)
CO2: 33 mEq/L — ABNORMAL HIGH (ref 19–32)
Calcium: 8.4 mg/dL (ref 8.4–10.5)
Calcium: 8.6 mg/dL (ref 8.4–10.5)
Calcium: 8.7 mg/dL (ref 8.4–10.5)
Calcium: 8.8 mg/dL (ref 8.4–10.5)
Calcium: 9 mg/dL (ref 8.4–10.5)
Chloride: 101 mEq/L (ref 96–112)
Chloride: 102 mEq/L (ref 96–112)
Chloride: 103 mEq/L (ref 96–112)
Chloride: 104 mEq/L (ref 96–112)
Creatinine, Ser: 0.83 mg/dL (ref 0.4–1.5)
Creatinine, Ser: 0.87 mg/dL (ref 0.4–1.5)
Creatinine, Ser: 0.95 mg/dL (ref 0.4–1.5)
Creatinine, Ser: 0.98 mg/dL (ref 0.4–1.5)
Creatinine, Ser: 1.01 mg/dL (ref 0.4–1.5)
Creatinine, Ser: 1.02 mg/dL (ref 0.4–1.5)
GFR calc Af Amer: >60 mL/min (ref >60)
GFR calc Af Amer: >60 mL/min (ref >60)
GFR calc Af Amer: >60 mL/min (ref >60)
GFR calc Af Amer: >60 mL/min (ref >60)
GFR calc non Af Amer: >60 mL/min (ref >60)
GFR calc non Af Amer: >60 mL/min (ref >60)
GFR calc non Af Amer: >60 mL/min (ref >60)
GFR calc non Af Amer: >60 mL/min (ref >60)
GFR calc non Af Amer: >60 mL/min (ref >60)
Glucose, Bld: 113 mg/dL — ABNORMAL HIGH (ref 70–99)
Glucose, Bld: 122 mg/dL — ABNORMAL HIGH (ref 70–99)
Glucose, Bld: 138 mg/dL — ABNORMAL HIGH (ref 70–99)
Glucose, Bld: 138 mg/dL — ABNORMAL HIGH (ref 70–99)
Glucose, Bld: 140 mg/dL — ABNORMAL HIGH (ref 70–99)
Glucose, Bld: 142 mg/dL — ABNORMAL HIGH (ref 70–99)
Potassium: 3.7 mEq/L (ref 3.5–5.1)
Potassium: 3.9 mEq/L (ref 3.5–5.1)
Potassium: 4 mEq/L (ref 3.5–5.1)
Potassium: 4.1 mEq/L (ref 3.5–5.1)
Sodium: 135 mEq/L (ref 135–145)
Sodium: 140 mEq/L (ref 135–145)
Total Bilirubin: 1.1 mg/dL (ref 0.3–1.2)
Total Bilirubin: 1.2 mg/dL (ref 0.3–1.2)
Total Bilirubin: 1.2 mg/dL (ref 0.3–1.2)
Total Bilirubin: 1.3 mg/dL — ABNORMAL HIGH (ref 0.3–1.2)
Total Protein: 5.7 g/dL — ABNORMAL LOW (ref 6.0–8.3)
Total Protein: 6.3 g/dL (ref 6.0–8.3)
Total Protein: 6.3 g/dL (ref 6.0–8.3)
Total Protein: 6.4 g/dL (ref 6.0–8.3)

## 2010-04-15 LAB — GLUCOSE, CAPILLARY
Glucose-Capillary: 111 mg/dL — ABNORMAL HIGH (ref 70–99)
Glucose-Capillary: 113 mg/dL — ABNORMAL HIGH (ref 70–99)
Glucose-Capillary: 115 mg/dL — ABNORMAL HIGH (ref 70–99)
Glucose-Capillary: 121 mg/dL — ABNORMAL HIGH (ref 70–99)
Glucose-Capillary: 128 mg/dL — ABNORMAL HIGH (ref 70–99)
Glucose-Capillary: 138 mg/dL — ABNORMAL HIGH (ref 70–99)
Glucose-Capillary: 139 mg/dL — ABNORMAL HIGH (ref 70–99)
Glucose-Capillary: 147 mg/dL — ABNORMAL HIGH (ref 70–99)
Glucose-Capillary: 159 mg/dL — ABNORMAL HIGH (ref 70–99)
Glucose-Capillary: 161 mg/dL — ABNORMAL HIGH (ref 70–99)
Glucose-Capillary: 161 mg/dL — ABNORMAL HIGH (ref 70–99)
Glucose-Capillary: 166 mg/dL — ABNORMAL HIGH (ref 70–99)
Glucose-Capillary: 174 mg/dL — ABNORMAL HIGH (ref 70–99)
Glucose-Capillary: 187 mg/dL — ABNORMAL HIGH (ref 70–99)
Glucose-Capillary: 79 mg/dL (ref 70–99)
Glucose-Capillary: 95 mg/dL (ref 70–99)

## 2010-04-15 LAB — DIFFERENTIAL
Basophils Absolute: 0 10*3/uL (ref 0.0–0.1)
Basophils Absolute: 0 10*3/uL (ref 0.0–0.1)
Basophils Absolute: 0 10*3/uL (ref 0.0–0.1)
Basophils Absolute: 0 10*3/uL (ref 0.0–0.1)
Basophils Relative: 0 % (ref 0–1)
Basophils Relative: 0 % (ref 0–1)
Basophils Relative: 1 % (ref 0–1)
Basophils Relative: 1 % (ref 0–1)
Eosinophils Absolute: 0.1 10*3/uL (ref 0.0–0.7)
Eosinophils Absolute: 0.2 10*3/uL (ref 0.0–0.7)
Eosinophils Absolute: 0.2 10*3/uL (ref 0.0–0.7)
Eosinophils Relative: 3 % (ref 0–5)
Eosinophils Relative: 4 % (ref 0–5)
Eosinophils Relative: 4 % (ref 0–5)
Eosinophils Relative: 4 % (ref 0–5)
Lymphocytes Relative: 16 % (ref 12–46)
Lymphocytes Relative: 17 % (ref 12–46)
Lymphocytes Relative: 19 % (ref 12–46)
Lymphocytes Relative: 20 % (ref 12–46)
Lymphocytes Relative: 21 % (ref 12–46)
Lymphocytes Relative: 24 % (ref 12–46)
Lymphs Abs: 0.9 10*3/uL (ref 0.7–4.0)
Lymphs Abs: 1 10*3/uL (ref 0.7–4.0)
Lymphs Abs: 1.5 10*3/uL (ref 0.7–4.0)
Monocytes Absolute: 0.5 10*3/uL (ref 0.1–1.0)
Monocytes Absolute: 0.6 10*3/uL (ref 0.1–1.0)
Monocytes Relative: 10 % (ref 3–12)
Monocytes Relative: 11 % (ref 3–12)
Monocytes Relative: 7 % (ref 3–12)
Monocytes Relative: 9 % (ref 3–12)
Neutro Abs: 3.1 10*3/uL (ref 1.7–7.7)
Neutro Abs: 3.6 10*3/uL (ref 1.7–7.7)
Neutro Abs: 3.8 10*3/uL (ref 1.7–7.7)
Neutro Abs: 4.6 10*3/uL (ref 1.7–7.7)
Neutro Abs: 5 10*3/uL (ref 1.7–7.7)
Neutrophils Relative %: 66 % (ref 43–77)
Neutrophils Relative %: 67 % (ref 43–77)
Neutrophils Relative %: 68 % (ref 43–77)
Neutrophils Relative %: 69 % (ref 43–77)
Neutrophils Relative %: 70 % (ref 43–77)
Neutrophils Relative %: 71 % (ref 43–77)

## 2010-04-15 LAB — URINALYSIS, ROUTINE W REFLEX MICROSCOPIC
Bilirubin Urine: NEGATIVE
Glucose, UA: NEGATIVE mg/dL
Ketones, ur: NEGATIVE mg/dL
Leukocytes, UA: NEGATIVE
pH: 5.5 (ref 5.0–8.0)

## 2010-04-15 LAB — CARDIAC PANEL(CRET KIN+CKTOT+MB+TROPI)
CK, MB: 4.8 ng/mL — ABNORMAL HIGH (ref 0.3–4.0)
Total CK: 95 U/L (ref 7–232)

## 2010-04-15 LAB — CBC
HCT: 35.8 % — ABNORMAL LOW (ref 39.0–52.0)
HCT: 38.2 % — ABNORMAL LOW (ref 39.0–52.0)
HCT: 38.9 % — ABNORMAL LOW (ref 39.0–52.0)
HCT: 39.9 % (ref 39.0–52.0)
HCT: 39.9 % (ref 39.0–52.0)
HCT: 41.4 % (ref 39.0–52.0)
Hemoglobin: 11.9 g/dL — ABNORMAL LOW (ref 13.0–17.0)
Hemoglobin: 13.1 g/dL (ref 13.0–17.0)
Hemoglobin: 13.3 g/dL (ref 13.0–17.0)
Hemoglobin: 13.5 g/dL (ref 13.0–17.0)
Hemoglobin: 13.5 g/dL (ref 13.0–17.0)
Hemoglobin: 13.6 g/dL (ref 13.0–17.0)
MCHC: 33.8 g/dL (ref 30.0–36.0)
MCHC: 33.9 g/dL (ref 30.0–36.0)
MCHC: 33.9 g/dL (ref 30.0–36.0)
MCHC: 34.2 g/dL (ref 30.0–36.0)
MCHC: 34.2 g/dL (ref 30.0–36.0)
MCHC: 34.4 g/dL (ref 30.0–36.0)
MCHC: 34.5 g/dL (ref 30.0–36.0)
MCV: 98.3 fL (ref 78.0–100.0)
MCV: 98.5 fL (ref 78.0–100.0)
MCV: 99.2 fL (ref 78.0–100.0)
MCV: 99.3 fL (ref 78.0–100.0)
MCV: 99.3 fL (ref 78.0–100.0)
Platelets: 119 10*3/uL — ABNORMAL LOW (ref 150–400)
Platelets: 140 10*3/uL — ABNORMAL LOW (ref 150–400)
RBC: 3.57 MIL/uL — ABNORMAL LOW (ref 4.22–5.81)
RBC: 3.64 MIL/uL — ABNORMAL LOW (ref 4.22–5.81)
RBC: 3.86 MIL/uL — ABNORMAL LOW (ref 4.22–5.81)
RBC: 4.17 MIL/uL — ABNORMAL LOW (ref 4.22–5.81)
RDW: 13 % (ref 11.5–15.5)
RDW: 13.1 % (ref 11.5–15.5)
RDW: 13.1 % (ref 11.5–15.5)
RDW: 13.5 % (ref 11.5–15.5)
WBC: 5.2 10*3/uL (ref 4.0–10.5)
WBC: 5.5 10*3/uL (ref 4.0–10.5)
WBC: 5.8 10*3/uL (ref 4.0–10.5)
WBC: 5.9 10*3/uL (ref 4.0–10.5)

## 2010-04-15 LAB — CK TOTAL AND CKMB (NOT AT ARMC)
CK, MB: 1.8 ng/mL (ref 0.3–4.0)
Relative Index: INVALID (ref 0.0–2.5)
Relative Index: INVALID (ref 0.0–2.5)
Total CK: 99 U/L (ref 7–232)

## 2010-04-15 LAB — URINE CULTURE: Colony Count: NO GROWTH

## 2010-04-15 LAB — BASIC METABOLIC PANEL
Chloride: 106 mEq/L (ref 96–112)
GFR calc Af Amer: >60 mL/min (ref >60)
Potassium: 3.4 mEq/L — ABNORMAL LOW (ref 3.5–5.1)

## 2010-04-15 LAB — APTT: aPTT: 25 seconds (ref 24–37)

## 2010-04-15 LAB — POCT I-STAT 3, ART BLOOD GAS (G3+)
Acid-base deficit: 5 mmol/L — ABNORMAL HIGH (ref 0.0–2.0)
Bicarbonate: 19.6 mEq/L — ABNORMAL LOW (ref 20.0–24.0)
O2 Saturation: 96 %
Patient temperature: 98.6
pO2, Arterial: 86 mmHg (ref 80.0–100.0)

## 2010-04-15 LAB — URINE MICROSCOPIC-ADD ON

## 2010-04-15 LAB — BRAIN NATRIURETIC PEPTIDE: Pro B Natriuretic peptide (BNP): 84 pg/mL (ref 0.0–100.0)

## 2010-04-15 LAB — TROPONIN I: Troponin I: 0.19 ng/mL — ABNORMAL HIGH (ref 0.00–0.06)

## 2010-04-15 LAB — HEMOGLOBIN A1C: Mean Plasma Glucose: 134 mg/dL

## 2010-05-27 NOTE — H&P (Signed)
[name], [name]                ACCOUNT NO.:  [id number]   MEDICAL RECORD NO.:  [id number]          PATIENT TYPE:  INP   LOCATION:  3106                         FACILITY:  [facility]   PHYSICIAN:  Danae Orleans. Venetia Maxon, M.D.  DATE OF BIRTH:  [birth year]   DATE OF ADMISSION:  11/13/2003  DATE OF DISCHARGE:                                HISTORY & PHYSICAL   John [name] is a 77 year old man who presented to the emergency room today,  after he complained of a severe headache which was unrelenting for four  days.  He had a head CT which was obtained at [facility] which  demonstrates a right subacute on chronic subdural hematoma with significant  mass effect.  He is otherwise without significant complaints.  He denies any  weakness, numbness, other than numbness in his feet related to his diabetes.  He has not had trouble with his balance, nausea or vomiting.  He says the  headache has been unresolved with Tylenol.   PAST MEDICAL HISTORY:  1.  Status post CABG x 4 vessels seven years ago.  2.  Hypertension.  3.  Diabetes.   No known drug allergies.   MEDICATIONS:  1.  Actos 15 mg daily.  2.  Altace 10 mg daily.  3.  Maxzide 25 mg daily.  4.  Vytorin 10/40, one half tablet daily.  5.  Aspirin 81 mg daily.  6.  Multivitamin.  7.  Coumadin 7.5 mg daily.  His most recent INR was greater than 3.  He has      cut back on his Coumadin, and his INR today was 2.6 at Dr. Laurey Morale      office.   PHYSICAL EXAMINATION:  VITAL SIGNS:  His pulse is 68, blood pressure 140/98,  respiratory rate of 20.  GENERAL:  He is a morbidly obese white male.  On examination he is awake,  alert, and oriented.  He is laying in bed.  HEENT:  Pupils are equal, round and reactive to light.  Extraocular  movements are intact.  NEUROLOGIC:  His cranial nerves are intact on detailed testing.  He moves  all extremities without apparent drift.  He has full strength in bilateral  upper and lower extremities.   He has decreased sensation to light touch in  both of his feet consistent with diabetic peripheral neuropathy.  His  cerebellar examination is normal.  Deep tendon reflexes are 2 in the biceps,  triceps, and brachial radialis, 2 at the knees, 1 at the ankles.  Toes are  downgoing to plantar stimulation.  CHEST:  Clear to auscultation.  HEART:  Regular rate and rhythm without murmur.  ABDOMEN:  Soft, nontender.  Active bowel sounds.  No hepatosplenomegaly  appreciated.  EXTREMITIES:  No edema, clubbing, or cyanosis in the lower extremities.   IMPRESSION:  John [name] is a 77 year old man with a history of coronary  artery disease with atrial fibrillation on Coumadin with diabetes and  subdural hematoma.   He will be admitted.  He needs anticoagulation reversed.  He will need bur  hole drainage of subdural  hematoma.  He will be admitted to the neurologic  intensive care unit.  He will be given fresh frozen plasma, and he is no  longer going to be able to remain on Coumadin.      [name]   JDS/MEDQ  D:  11/13/2003  T:  11/13/2003  Job:  [id number]   cc:   Loraine Leriche A. Waynard Edwards, M.D.  [address]  [place]  [place] [address]  Fax: [phone]

## 2010-05-27 NOTE — Op Note (Signed)
NAMELUKE, FALERO                ACCOUNT NO.:  [id number]   MEDICAL RECORD NO.:  [id number]          PATIENT TYPE:  INP   LOCATION:  3108                         FACILITY:  [facility]   PHYSICIAN:  Stefani Dama, M.D.  DATE OF BIRTH:  08-10-1922   DATE OF PROCEDURE:  12/04/2003  DATE OF DISCHARGE:                                 OPERATIVE REPORT   PREOPERATIVE DIAGNOSIS:  Left frontotemporal chronic subdural hematoma.   POSTOPERATIVE DIAGNOSIS:  Left frontotemporal chronic subdural hematoma.   PROCEDURES:  Left frontotemporal craniotomy and evacuation of subdural  hematoma.   SURGEON:  Stefani Dama, M.D.   FIRST ASSISTANT:  Hilda Lias, M.D.   ANESTHESIA:  General endotracheal.   INDICATIONS:  Mr. Messer is a 77 year old individual who has had a  significant subdural hematoma with a chronic subdural being drained a few  weeks ago.  He had been on Coumadin anticoagulation for severe  atherosclerotic coronary disease and there was concern of possibility of a  stroke.  The patient did in fact have an embolic stroke, but he had the  presence of a large right-sided subdural hematoma that was drained via bur  hole.  The patient had some small left-sided subdural hematomas which after  drainage of the right-sided hematoma seemed to enlarge.  Nevertheless, he  was clinically stable and it was decided to watch him clinically.  The  patient has been doing poorly for the past several days.  His wife contacted  [place] today and a CT scan demonstrated that there has been substantial increase  in the left-sided subdural hematoma with both chronic and subacute  components.  He was advised regarding surgery.   DESCRIPTION OF PROCEDURE:  The patient was brought to the operating room  supine on the stretcher.  After smooth induction of general endotracheal  anesthesia, the left side of his head was shaved and prepped with Betadine  scrub and then Duraprep and then draped in a sterile fashion.   A vertical  incision was made from the anterior portion of the ear through the vertex of  the scalp.  This was carried down to the cranium and a self-retaining  retractor was placed in the wound.  The temporalis muscle and fascia were  opened on either side towards the lateral aspects and after placing self-  retaining retractors in the wound a craniotomy flap was raised based on the  frontotemporal region.  This was a circular flap approximately 2 inches in  diameter.  The bone was elevated and underlying dura was noted to be tense  and deep blue in discoloration.  It was opened in a cruciate fashion.  Underneath was noted to be several membranes.  The outer membrane allowed  for some drainage of chronic old blood fluid and then a second membrane was  opened with some less chronic fluid and subacute fluid and blood clot was  noted.  A third membrane was opened and this yielded a small amount of some  chronic fluid.  Ultimately the parietal membrane overlying the brain was  opened.  This was a very  thin membrane.  The membranes were cut back to the  extent that the opening would allow and irrigation in between the membrane  layers was undertaken to release any chronic bloody accumulation.  Once this  was accomplished and hemostasis was achieved, the dura was closed in the  center only.  Then a subdural drain was left and brought through a separate  small incision.  This was a small Jackson-Pratt drain.  The bone flap was  then replaced and secured with a single central tack up suture and three  medium sized  __________ plates to secure the bone in place with Osteomed set up.  The  galea was then closed with 2-0 Vicryl in interrupted fashion and surgical  staples were used on the skin.  The head was then dressed in the usual  fashion with a wrap and the patient was returned to the recovery room in  stable condition.      Henr   HJE/MEDQ  D:  12/04/2003  T:  12/04/2003  Job:  [id number]

## 2010-05-27 NOTE — Op Note (Signed)
NAMEJAYEN, BROMWELL                ACCOUNT NO.:  [id number]   MEDICAL RECORD NO.:  [id number]          PATIENT TYPE:  INP   LOCATION:  3114                         FACILITY:  [facility]   PHYSICIAN:  Danae Orleans. Venetia Maxon, M.D.  DATE OF BIRTH:  [birth year]   DATE OF PROCEDURE:  11/14/2003  DATE OF DISCHARGE:                                 OPERATIVE REPORT   PREOPERATIVE DIAGNOSIS:  Right subdural hematoma.   POSTOPERATIVE DIAGNOSIS:  Right subdural hematoma.   PROCEDURE:  Bur hole drainage of right-sided subdural hematoma.   SURGEON:  Danae Orleans. Venetia Maxon, M.D.   ANESTHESIA:  General endotracheal.   ESTIMATED BLOOD LOSS:  Minimal.   COMPLICATIONS:  None.   DISPOSITION:  Recovery.   INDICATIONS FOR PROCEDURE:  John [name] is a 77 year old male with atrial  fibrillation on chronic Coumadin therapy, who developed a pan-hemispheric,  right subdural hematoma, subacute on chronic with right to left shift.  It  was elected take him to surgery for bur hole drainage of subdural hematoma.   DESCRIPTION OF PROCEDURE:  John [name] was brought to the operating room.  Following a satisfactory and uncomplicated general endotracheal anesthesia,  and placement of intravenous lines and Foley catheter, he was placed in the  supine position on the operating table.  His head was placed on a doughnut  head holder, right side of his scalp was shaved, then prepped and draped in  the usual sterile fashion.  The area of planned incision was infiltrated  with 0.25% Marcaine and 1% lidocaine with 1:200,000 epinephrine.   Incision was made in the posterior frontal region, just above the  superotemporal line, carried to the calvarium and a bur hole was created  using an acorn bit.  The dura was then incised with a #15 blade in a  cruciate fashion, revealing subdural membrane. Upon incision of subdural  membrane, there was brisk return of chronic subdural fluid under pressure.  This was then irrigated extensively with  saline, after washout of the  subdural hematoma. Subsequently, a #7 JP-drain was inserted through a  separate stab incision and hooked to grenade suction on thumb suction.  A  nylon stitch was placed at the drain exit site and Vicryl sutures were used  to reapproximate the galea and a running 3-0  nylon stitch was used to reapproximate the skin edges. The wound was dressed  with sterile, occlusive dressing.  The patient was taken to the recovery  room in stable and satisfactory condition, having tolerated his operation  well.  Counts were correct at the end of the case.      [name]   JDS/MEDQ  D:  11/14/2003  T:  11/15/2003  Job:  [id number]

## 2010-05-27 NOTE — Op Note (Signed)
NAMEMEREL, SANTOLI                ACCOUNT NO.:  [id number]   MEDICAL RECORD NO.:  [id number]          PATIENT TYPE:  OUT   LOCATION:  CATS                         FACILITY:  [facility]   PHYSICIAN:  Stefani Dama, M.D.  DATE OF BIRTH:  [birth year]   DATE OF PROCEDURE:  DATE OF DISCHARGE:  11/25/2003                                 OPERATIVE REPORT   PRE-OPERATIVE DIAGNOSIS:  Difficult venous access.   POST-OPERATIVE DIAGNOSIS:  Difficult venous access.   PROCEDURE:  Placement of left subclavian central venous catheter.   SURGEON:  Stefani Dama, M.D.   ANESTHETIC:  Lidocaine 1% without epinephrine 5 cc.   INDICATIONS:  Mr. Eryck Negron is a 77 year old individual who underwent  evaluation of subdural hematoma.  He has been modestly hypotensive.  He has  been drinking plenty but venous access has been a problem.  A central  catheter is now being placed.   PROCEDURE:  The patient was placed in a Trendelenburg position, and the left  subclavian region was prepped with Betadine solution.  Then after  infiltrating the skin and the deep tissues to the region, under the medial  portion of the clavicle with 5 cc of lidocaine without epinephrine, an 18-  gauge open trocar needle was placed into the subclavian vein.  A guide wire  was placed, and then using a Seldinger technique, a #7 French triple-lumen  catheter was placed into the left subclavian region without difficulty.  The  catheter position was checked by imaging.  The catheter was placed to the 25  cm region from the surface of the skin.  The catheter was sewn into place.      Henr   HJE/MEDQ  D:  12/06/2003  T:  12/06/2003  Job:  [id number]

## 2010-05-27 NOTE — Assessment & Plan Note (Signed)
DATE OF VISIT:  May 02, 2004.   MEDICAL RECORD NUMBER:  [id number].   John [name] is back regarding his left frontotemporal craniotomy and  evacuation.  He has continued to improve.  He completed outpatient speech  therapy.  He is independent at the household level.  He has increasing  balance.  He has only had one or two episodes where he has lost his balance.  He is able to pick himself up off of the ground at this point.  Mood has  been stable.  He is sleeping well.  He denies any seizure activity.  The  patient denies pain.  The patient can walk about 15-20 minutes at a time,  although he still seeks better endurance.   REVIEW OF SYSTEMS:  The patient some reports occasional numbness and limb  swelling.  Otherwise pertinent positives are listed above and the full  review of systems is in the health and history portion of the chart.   SOCIAL HISTORY:  The patient would like to get back to driving.  His wife  remains supportive and transports him as needed.   CURRENT MEDICATIONS:  1.  Tegretol 200 mg t.i.d.  2.  Glucophage 500 mg b.i.d.  3.  Actos 15 mg daily.  4.  Folic acid daily.  5.  Senokot as needed at bedtime.   PHYSICAL EXAMINATION:  VITAL SIGNS:  The blood pressure is 154/73, the pulse  is 65 and respiratory rate 20.  He is saturating 97% on room air.  GENERAL APPEARANCE:  The patient is pleasant and well kept.  His weight  looks stable.  HEART:  Regular rate and rhythm.  LUNGS:  Clear.  ABDOMEN:  Soft and nontender.  NEUROLOGIC:  The patient is alert and oriented x 3.  He has appropriate  affect.  Gait is good with normal stride.  When we attempted heel-to-toe  walking, he tended to fall a bit to the right side.  He also had difficulty  standing on his right leg.  Motor exam really was 4+-5/5 on the right side.  The patient is 5/5 on the left.  Sensory exam is grossly intact.  Fine motor  coordination is slightly decreased on the right side, but minimally so.  As  far as the cranial nerve exam, II-XII are grossly intact.  The patient did  have some problems with acuity, but this is due to his glasses, which are  not the right prescription.  In language, the patient had some mild word  finding problems, but easily was able to convey his needs and actually was  much more fluent that last visit.   ASSESSMENT:  1.  Status post bilateral subdural hematomas with evacuation.  2.  Gait disorder.  3.  Improving language deficits.  4.  Seizure prophylaxis with Tegretol.   PLAN:  1.  The patient no longer needs Tegretol at this point, so he may now stop      this medication.  2.  Recommend OT driving evaluation within a month to assess the patient's      ability to drive.  I think he only needs to perform the field portion of      the exam.  My concern is his reaction time, as well as language issues      that may interfere, but I am optimistic that he will do well.  3.  Continue exercise and endurance training at home.  The patient has made  good strides in this area.  4.  I will see the patient back in about three months' time.      ZTS/MedQ  D:  05/02/2004 13:24:04  T:  05/02/2004 15:20:21  Job #:  [id number]   cc:   Loraine Leriche A. Waynard Edwards, M.D.  [address]  [place]  [place] [address]  Fax: [phone]

## 2010-05-27 NOTE — H&P (Signed)
John [name], John [name]                ACCOUNT NO.:  [id number]   MEDICAL RECORD NO.:  [id number]          PATIENT TYPE:  INP   LOCATION:  3108                         FACILITY:  [facility]   PHYSICIAN:  Stefani Dama, M.D.  DATE OF BIRTH:  [birth year]   DATE OF ADMISSION:  12/04/2003  DATE OF DISCHARGE:                                HISTORY & PHYSICAL   ADMITTING DIAGNOSIS:  Chronic left frontotemporal subdural hematoma with  left-to right shift.   HISTORY OF THE PRESENT ILLNESS:  John [name] is a 77 year old right-  handed individual who was treated by Dr. Maeola Harman as recently as  November 06th where he underwent bur hole drainage of a right-sided subdural  hematoma.  He was rehospitalized as recent as November 25, 2003 for a two-  day period of time because of some left-sided weakness and numbness with  waxing and waning symptoms, and it was noted that he had an acute anterior  temporal lobe cerebrovascular accident.  The patient was restarted on  aspirin.  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   PAST MEDICAL HISTORY:  The patient has a significant history of coronary  artery disease.  In addition his past medical history includes:  1.  Hypertension.  2.  Hyperlipidemia.  3.  Hemorrhoidectomy.  4.  Diabetes type 2  5.  The patient s hard of hearing.  6.  The patient has benign prostatic hypertrophy.  7.  Microalbuminuria.  8.  Gilbert's syndrome.  9.  Atrial fibrillation.  10. History of cerebrovascular accident.  11. Hyperlipidemia.   ALLERGIES:  The patient notes ALLERGIES TO LIPITOR, WJICH GIVES HIM A RASH  AND ATENOLOL, WHICH GIVES HIM BRADYCARDIA.   MEDICATIONS:  The patient's current medications at the time of this  admission include:  1.  Actos 15 mg a day.  2.  Altace 10 mg daily.  3.  Maxzide 25 mg a day.  4.  Multivitamin daily.  5.  Vytorin 1040 1/1 tablet daily.  6.  The patient is on Dilantin 100 mg q.8  hours.   During the last hospitalization he underwent a 2-D echocardiogram on  November 17th, which showed that he was in the upper limits of normal for  left ventricular size.  He had reduced left ventricular ejection fraction in  the range of 40-50%, mildly increased ventricular wall thickening, and  mildly calcified aortic valve with aortic regurg and left atrial  enlargement.  No clear cardiac source of the embolization was found for his  most recent CVA.  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  No atherosclerotic disease  was seen in the carotid bifurcation on the left hand side.   PHYSICAL EXAMINATION:  VITAL SIGNS:  On physical exam  today it was noted  that his blood pressure in the emergency department was 130/70.  His heart  rate was 85 and regular, respirations 18 and unlabored.  NEUROLOGIC EXAMINATION:  The patient's  level of consciousness is good. His  thought content and responses are appropriate.  On physical examination his  pupils are 3 mm and briskly react to light an accommodation.  Extraocular  movements are full.  The face is symmetric to grimace.  Tongue and uvula are  in the midline.  Sclerae and conjunctivae are intact and normal.  The head  reveals that it is normocephalic.  There is no evidence of trauma save for  the healing right frontotemporal bur hole.  NECK:  The neck is supple.  No bruits are heard.  The patient has evidence  of a mild right-sided drift.  In testing his upper extremity strength his  deep tendon reflexes are depressed in the biceps and triceps, 2+  in the  patella, trace in the Achilles.  Babinski's are equivocal on the right and  downgoing on the left.  HEENT:  General physical exam reveals the HEENT exam to be within the limits  of normal.  NECK:  The neck reveals no bruits.  HEART:  The heart has a  regular rate and rhythm.  No murmurs are heard.  ABDOMEN:  The abdomen is soft and protuberant.  Bowel sounds are positive.  No masses are palpable.  CHEST:  Well-healed median sternotomy incision.  EXTREMITIES:  The extremities reveal no cyanosis, clubbing or edema.   IMPRESSION:  The patient has evidence of worsening computerized tomographic  scan.  He is now to be admitted to undergo surgical evacuation via  craniotomy.   I discussed the ramifications of surgery with the patient and his wife.  He  is now to be taken to the operating room directly.      Henr   HJE/MEDQ  D:  12/05/2003  T:  12/05/2003  Job:  [id number]

## 2010-05-27 NOTE — Consult Note (Signed)
[name], [name]                ACCOUNT NO.:  [id number]   MEDICAL RECORD NO.:  [id number]          PATIENT TYPE:  INP   LOCATION:  3108                         FACILITY:  [facility]   PHYSICIAN:  Stefani Dama, M.D.  DATE OF BIRTH:  August 16, 1922   DATE OF CONSULTATION:  12/04/2003  DATE OF DISCHARGE:                                   CONSULTATION   REASON FOR CONSULTATION:  Increasing headache.   HISTORY OF PRESENT ILLNESS:  [name] is a 77 year old individual  who was admitted as recently as November 25, 2003. It seems that on November 15, 2003, he was admitted with bilateral subdural hematomas. He underwent  placement of a right burr hole to drain a subacute subdural hematoma. During  this time his left-sided hematoma was noted to increase in size. He was  watched carefully in the hospital. The patient developed some worsening  neurologic status and was re-admitted on November 25, 2003, where it was  noted that he had developed a right anterior temporal infarct. His subdural  hematoma appeared to be remaining stable on the left side. He was discharged  after a two=day stay and he was started on aspirin. The patient is brought  in by his wife who complains that he has been feeling weak, complaining of  headache, and has not been himself. A CT scan on admission to the ER was  performed and this reveals the fact that the patient has increased the size  of the right-sided subdural hematoma and now has right to left shift. He is  now to be admitted to undergo surgical evacuation of this hemorrhage. The  hemorrhage looks largely to be chronic in nature.   He will be admitted to my service for other details of his examination.  Please refer to the history and physical dictated under separate cover.      Henr   HJE/MEDQ  D:  12/05/2003  T:  12/05/2003  Job:  [id number]

## 2010-05-27 NOTE — Discharge Summary (Signed)
[name], [name]                ACCOUNT NO.:  [id number]   MEDICAL RECORD NO.:  [id number]          PATIENT TYPE:  INP   LOCATION:  4735                         FACILITY:  [facility]   PHYSICIAN:  Danae Orleans. Venetia Maxon, M.D.  DATE OF BIRTH:  04/08/1922   DATE OF ADMISSION:  11/25/2003  DATE OF DISCHARGE:  11/27/2003                                 DISCHARGE SUMMARY   REASON FOR ADMISSION:  1.  Subdural hematoma.  2.  Atrial fibrillation.  3.  Long-term use of anticoagulants.  4.  Hypertension.  5.  Type 2 diabetes.   HISTORY OF ILLNESS AND HOSPITAL COURSE:  John [name] is a 77 year old man  with chronic atrial fibrillation on Coumadin with an INR of 2.6 who  presented to the emergency room with a 3-4-day history of progressively  worsening headache, resolved with Tylenol.  Head CT showed a right chronic  to subacute subdural hematoma and a small amount of left-sided subdural  hematoma which appeared to be chronic.  He denied any weakness or numbness  but had significant persistent right-sided headache.  It was elected to  admit the patient, and his anticoagulation was reversed.  He was then taken  to the operating room on __________ 2005 for bur hole drainage of right-  sided subdural.  He did well after the surgery, and a JP drain was  maintained in position.  This gradually decreased in drainage.  He had a  heat CT afterwards which showed that the right-sided subdural had virtually  resolved but that there was 8-mm-thick left subdural.  He was observed, and  a repeat CT was obtained which showed no interval change, and, because of  that, it was elected to remove the drain.  He was then immobilized, did well  and was discharged home with instructions to follow up in my office in one  week with a repeat CT scan of the head.  He was instructed to be off  anticoagulation indefinitely.   DISCHARGE MEDICATIONS:  1.  Vicodin 5/500 mg 1-2 tablets every 4 hours as needed for pain.  2.   Dilantin 100 mg 3 times daily by mouth.  3.  Hold aspirin until next week.  4.  Hold Coumadin.      [name]   JDS/MEDQ  D:  01/08/2004  T:  01/08/2004  Job:  [id number]

## 2010-05-27 NOTE — Op Note (Signed)
John [name], John [name]                ACCOUNT NO.:  [id number]   MEDICAL RECORD NO.:  [id number]          PATIENT TYPE:  INP   LOCATION:  3114                         FACILITY:  [facility]   PHYSICIAN:  Stefani Dama, M.D.  DATE OF BIRTH:  12/30/1922   DATE OF PROCEDURE:  DATE OF DISCHARGE:                                 OPERATIVE REPORT   PREOPERATIVE DIAGNOSIS:  Re-accumulation of left subdural hematoma.   POSTOPERATIVE DIAGNOSIS:  Re-accumulation of left subdural hematoma.   OPERATION:  Re-evacuation of left subdural hemorrhage and effusion.   SURGEON:  Stefani Dama, M.D.   FIRST ASSISTANT:  Coletta Memos, M.D.   ANESTHESIA:  General endotracheal.   INDICATIONS:  John [name] is a 77 year old individual who has had a  left sided subdural hematoma drained on December 04, 2003.  He did well for  the first few days, but however he has developed problems with dysphagia.  This has become progressively worse. An MRI today in follow-up demonstrates  that the patient has re-accumulated his subdural hemorrhage with some acute  blood and blood that appears to be consistent with subdural effusion with  significant shift and mass effect. He is taken back to the operating room to  re-evacuate this collection.   DESCRIPTION OF PROCEDURE:  The patient was brought to the operating room and  placed on the table in the supine position. After the smooth induction of  general endotracheal anesthesia, a bump was placed under the left shoulder  and the patient's head was turned to the right.  The left sided scalp  incision from his previous craniotomy was reopened while prepping with  Betadine and removing the surgical staples.  Metzenbaum scissors were used  to reopen this incision and the galea sutures were released along with  sutures in the temporalis muscle flap.  The bone flap was identified and  this was released by releasing the tack-up suture in the center and then  removing the  outbound screws.  The bone flap was laid aside.  The dura  underneath was noted to have a small amount of clotted blood over the  surface.  This was easily evacuated away and then the dural sutures were  opened in the same cruciate fashion.  Blood was encountered here that was  mostly chronic.  There was some acute blood, but the fluid that drained  appeared to be light brown fluid consistent with mixed chronic blood and  what I suspect was subdural effusion.  The fluid was evacuated and some  clots were also encountered and these were evacuated also.  The edges of the  membrane were noted to be suffused with some old blood and new blood and  these were trimmed back even further.  The subdural space was irrigated away  of the fluid and some clotted material in their surroundings and ultimately  all areas of the subdural space were explored both anteriorly, inferiorly,  in the superior aspect and posteriorly.  It was felt that much of the re-  accumulation was primarily diffusion fluid. A 7 French subdural drain was  then placed in the frontal region primarily and this was brought out through  a separate stab incision.  The dura was reapproximated with 4-0 Nurolon  sutures. The tack-up suture was replaced and the bone flap was replaced and  secured with the screws.  The galea was then closed with 2-0 Vicryl in  interrupted fashion as was the temporalis fascia.  Surgical staples were  used in the skin.  Blood loss for the total procedure was estimated at 100  mL.  The patient's head was wrapped and he was returned to the recovery room  in stable condition.      Henr   HJE/MEDQ  D:  12/10/2003  T:  12/11/2003  Job:  [id number]

## 2010-05-27 NOTE — Discharge Summary (Signed)
NAMEJERYL, John [name]                ACCOUNT NO.:  [id number]   MEDICAL RECORD NO.:  [id number]          [name] TYPE:  INP   LOCATION:  3114                         FACILITY:  [facility]   PHYSICIAN:  Stefani Dama, M.D.  DATE OF BIRTH:  [birth year]   DATE OF ADMISSION:  12/04/2003  DATE OF DISCHARGE:  12/15/2003                                 DISCHARGE SUMMARY   ADMISSION DIAGNOSES:  Left frontotemporal subdural hematoma, chronic and  acute.   DISCHARGE DIAGNOSES:  Left frontotemporal subdural hematoma, chronic and  acute.   OPERATION:  Left frontotemporal craniotomy and evacuation of subdural  hematoma on December 04, 2003 and then re-operation of left frontotemporal  subdural hematoma on December 10, 2003.   CONDITION ON DISCHARGE:  Improving.   HOSPITAL COURSE:  John [name] is a 77 year old individual who has had a  history of chronic subdural hematomas that was initially treated by Dr.  Maeola Harman.  He had a right-sided collection that was drained with a bur  hole several weeks before.  John [name] has significant history of coronary  artery disease and because of this, requires Coumadin anticoagulation.  John  [name] had been started on aspirin after his bur hole drainage.  He was  being watched clinically as it was known that he had a left-sided subdural  hematoma however, on John day of admission, John [name] was brought to John  emergency room by his wife because he had had increasing confusion.  John  [name] had John week before, had evidence of a right-sided ischemic stroke  and it was therefore felt that he needed to be on some form of  anticoagulation therapy.  On December 04, 2003, he was taken to John  operating room where he underwent John left frontotemporal craniotomy for  drainage of a large clot and subdural hematoma.  Postoperatively, John  [name] seemed to do well for John first 48 hours.  A small subdural drain  was left in place and because John output was  minimal, John drain was removed.  Subsequently, however John [name] appeared to develop increasing problems  with dysphagia and he ultimately became aphasic.  Follow-up studies  demonstrated that he had a large pneumocephalus initially but then this  began to fill in with blood and fluid.  On December 10, 2003 he underwent an  MRI which demonstrated that he had increasing mass effect and shift.  There  was no evidence of an acute stroke but it was felt that because of John  reaccumulation of blood in John subdural space, he would require re-  evaluation and this was performed on December 10, 2003.  Postoperatively,  John [name] had a slow improvement.  He did have a subdural drain that this  time was left in place despite minimal drainage at first and then began to  drain spinal fluid that was initially blood-tinged.  This became straw-  colored however, it allowed for drainage of any air and gradually his brain  returned to John undersurface of his skull with a minimal accumulation of  spinal fluid.  John [name]'s  neurologic status continued to improved and by  December 14, 2003, he was talkative fully, feeling strong and his  mobilization which had been impaired up until this point, began to improve.  It was felt that he was an appropriate candidate for inpatient  rehabilitation and he was transferred to John rehabilitative services unit on  December 15, 2003.  John [name] will still require some level of  anticoagulation with aspirin and Plavix, however at John current time, he has  been advised that this should be avoided due to John recency of John surgery.  His incision was clean and dry.  Sutures were removed prior to his transfer  off John unit.      Henr   HJE/MEDQ  D:  01/14/2004  T:  01/14/2004  Job:  [id number]

## 2010-05-27 NOTE — Discharge Summary (Signed)
NAMEDUGLAS, John [name]                ACCOUNT NO.:  [id number]   MEDICAL RECORD NO.:  [id number]          PATIENT TYPE:  INP   LOCATION:  4735                         FACILITY:  [facility]   PHYSICIAN:  Mark A. Perini, M.D.   DATE OF BIRTH:  1922-09-12   DATE OF ADMISSION:  11/25/2003  DATE OF DISCHARGE:  11/27/2003                                 DISCHARGE SUMMARY   DISCHARGE DIAGNOSES:  1.  Acute right temporal lobe infarct most likely due to embolization of      plaque from occluded right internal carotid artery with subsequent left      upper extremity weakness and mild dysarthria.  2.  Recent bilateral subdural hematomas clinically and radiographically      stable at this time.  3.  Atherosclerotic coronary artery disease stable.  4.  Type 2 diabetes stable.  5.  Mildly reduced ejection fraction of 40-50% by echocardiogram done this      admission.  6.  Mild hyponatremia.  7.  Hypokalemia corrected.  8.  Hyperlipidemia.  9.  Hypertension.  10. Decreased hearing.  11. Chronic atrial fibrillation.   PROCEDURE:  1.  A 2-D echocardiogram done on November 26, 2003:  This showed upper      limits of normal left ventricular size, reduced left ventricular with an      ejection fraction in the range of 40-50%.  Mildly increased left      ventricular wall thickness.  Mildly calcified aortic valve with mild      aortic regurgitation and left atrial enlargement.  No clear cardiac      source of embolization was identified, although this was just a      transthoracic study.  2.  MRI/MRA of the brain done on November 25, 2003 which showed an acute      infarct in the anterior temporal lobe on the right.  It also showed      bilateral mixed age subdural hematomas, larger on the left than the      right with loculations and some chronic dural thickening enhancement.      There was some mass-effect on the left such as there was a left to right      shift of 2 mm.  Also chronic small vessel  disease was noted.  3.  MRA showed complete likely chronic occlusion of the right internal      carotid artery and reconstitution of the distal right internal carotid      artery probably from external to internal collaterals.  No      atherosclerotic disease was seen at the carotid bifurcation on the left.  4.  Intracranial MRA showed right internal carotid artery occlusion with      some distal reconstitution.  The right anterior circulation appeared      dimunitive and the vessels appeared irregular.  5.  Cranial CT scan on November 25, 2003 which showed removal of the right      subdural drain without evidence of acute or superimposed abnormality on      the right and acute  on chronic component of left subdural hematoma with      some increase in mass-effect since November 18, 2003.  6.  An electroencephalogram performed on November 25, 2003 which was      abnormal on the right side.  7.  Carotid dopplers showing occluded right ICA on the left.  No significant      ICA stenosis was seen and bilateral vertebral arterial flow was      antegrade.  8.  Speech therapy consult which showed no significant signs or symptoms      aspiration with a bedside swallowing exam.   DISCHARGE MEDICATIONS:  1.  Actos 15 mg q.d.  2.  Altace 10 mg q.d.  3.  Maxzide 37.5/25 mg 1/2 tablet q.d.  4.  Multivitamin q.d.  5.  Vytorin 10/40 with 1 tablet each evening.  6.  No Coumadin.  7.  Aspirin 325 mg q.d.  8.  Dilantin 100 mg 2 tablets each morning and 2 tablets each evening.  9.  Tylenol 650 mg q.6h. p.r.n. pain.   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 Danae Orleans. Venetia Maxon on November 14, 2003.  He was discharged on Dilantin and then presented on November 25, 2003 with  weakness and numbness in his left upper extremity.  Stat MRI and MRA showed  an acute right  temporal lobe cerebrovascular accident.  In discussions with  neurology and neurosurgery, it was elected to treat the patient with aspirin  but not to resume Coumadin.  The patient was admitted for further care.   HOSPITAL COURSE:  John [name] was admitted to a telemetry bed.  He had some  improvement in his symptoms.  He did have some on and off feelings of  numbness in his left hand but they were not there constantly.  He was able  to walk without difficulty.  He had some slight dysarthria which seemed to  improve during his hospital stay.  The above-studies were performed with the  results as noted above.   On November 27, 2003, he was deemed stable for discharge home for further  outpatient follow-up.   DISCHARGE PHYSICAL EXAMINATION:  VITAL SIGNS:  Temperature 98.1, afebrile,  pulse 87, respiratory rate 20, blood pressure 140/73.  Blood sugars range  from 106 to 197.  Saturation 95% on room air.  GENERAL:  He was alert, in no acute distress.  LUNGS:  Clear to auscultation bilaterally with no wheezes, rales, or  rhonchi.  HEART:  Irregularly irregular with no murmur, rub, or gallop.  ABDOMEN:  Soft and nontender with no masses or hepatosplenomegaly.  EXTREMITIES:  The left hand grip strength was somewhat reduced.   LABORATORY DATA:  White count 6.2 with a normal differential.  Hemoglobin  12.9, platelet count 223,000.  Sodium 128, potassium 3.5, chloride 96, CO2  23, BUN 14, creatinine 1.2, glucose 132, calcium 8.5.  Dilantin level was  9.5.  Fasting lipid profile showed a total cholesterol of 148, triglycerides  of 137.  HDL of 39 and LDL of 82.  Homocystine level was 13.32.  Liver  function tests on November 26, 2003 were normal with the exception of a low  albumin of 3.4 and coagulation studies on admission were within normal  limits.   DISCHARGE INSTRUCTIONS:  John [name] is to be up as tolerated.  He is to call  if he has any recurrent problems.  He is to follow a low salt, low  fat,  diabetic diet.  He is to follow up with our office next Tuesday, December 01, 2003 and at that time will have a Dilantin level and a sodium rechecked.  He  will be evaluated as for possible need of outpatient occupational therapy.  He is to follow up with Dr. Janalyn Shy P. Sethi at [facility] in three to four weeks as well.  He is also to follow up as  previously instructed by Dr. Danae Orleans. Venetia Maxon.       MAP/MEDQ  D:  11/27/2003  T:  11/28/2003  Job:  [id number]   cc:   Danae Orleans. Venetia Maxon, M.D.  [address]  [place]  [place] [address]  Fax: [phone]   Pramod P. Pearlean Brownie, MD  Fax: [phone]   W. Ashley Royalty., M.D.  [address]  [place]  [place] [address]  Fax: [phone]

## 2010-05-27 NOTE — H&P (Signed)
[name], [name]:  [id number]   MEDICAL RECORD NO.:  [id number]          PATIENT TYPE:  INP   LOCATION:  1826                         FACILITY:  [facility]   PHYSICIAN:  Gwen Pounds, MD       DATE OF BIRTH:  [birth year]   DATE OF ADMISSION:  11/25/2003  DATE OF DISCHARGE:                                HISTORY & PHYSICAL   CHIEF COMPLAINT:  Left sided weakness, numbness, waxing and waning symptoms,  acute right anterior temporal lobe CVA.   HISTORY OF PRESENT ILLNESS:  77 year old male with multiple medical problems  was placed on Coumadin in July per Dr. Donnie Aho for atrial fib, admitted  November 13, 2003, to November 15, 2003, secondary to headache and was found  to have right sided subacute and chronic subdural hematoma and underwent  right bur hole per Dr. Venetia Maxon on November 5.  He was discharged on Dilantin  and presents today with weakness and numbness of the left side.  Cranial CT  was worrisome for increase of the subdural hematoma with increasing mass  effect on the left and status post drain on the right.  Dr. Sandria Manly was brought  in and he had STAT MRI and EEG.  The EEG was negative for seizure but MRI  confirmed acute right anterior temporal lobe CVA.  Dr. Venetia Maxon and Dr. Sandria Manly  discussed this and aspirin will and has been used for the treatment.  Currently, the symptoms are waned and he is able to move all four  extremities appropriately and his numbness is gone.  He is being admitted  for evaluation and treatment.  Of note, the right ICA is 100% occluded.  Because his Dilantin level was low, it was bolused IV.   PAST MEDICAL HISTORY:  1.  Hypertension.  2.  Hyperlipidemia.  3.  Coronary artery disease status post four vessel bypass surgery in 1998.  4.  Hemorrhoidectomy and diabetes mellitus type 2.  5.  Hard of hearing.  6.  Benign prostatic hypertrophy.  7.  Microalbuminuria.  8.  Gilbert's syndrome.  9.  Atrial fibrillation.  10. Subdural  hematoma.  11. CVA.  12. Hyperlipidemia.   ALLERGIES:  Lipitor gives him a rash, Atenolol gives him bradycardia.   MEDICATIONS:  1.  Actos 15 daily.  2.  Altace 10 daily.  3.  Maxzide 25 daily.  4.  Multi-vitamin daily.  5.  Vytorin 10/40 1/2 daily.  6.  He is off Coumadin and aspirin currently.  7.  Dilantin 100 q.8h.   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  He does  not smoke and does not drink.   FAMILY HISTORY:  Colon cancer, abdominal aortic aneurysm, prostate cancer,  melanoma, and coronary artery disease.   REVIEW OF SYMPTOMS:  Denies chest pain or shortness of breath.  Denies eyes,  ears, nose, and throat symptoms currently.  He denies any abdominal  complaints.  Denies any bowel or bladder issues at this time.  The stroke  like symptoms are currently resolving.  All other organs system review  negative.   PHYSICAL EXAMINATION:  VITAL SIGNS:  Afebrile, all vital signs are stable, 99% saturation.  GENERAL:  Alert and oriented x  person, place, and time.  PULMONARY:  Clear to auscultation bilaterally.  CARDIAC:  Irregular midline sternotomy.  NOSE AND THROAT:  Tongue protrudes midline.  No bruit.  No lymphadenopathy.  ABDOMEN:  Obese, soft, nontender, nondistended.  EXTREMITIES:  No clubbing or cyanosis, 1+ edema bilaterally.  Left leg  strength 5/5, toes downgoing.  NEUROLOGIC:  Currently intact.  Right skull status post bur hole placement.   ANCILLARY DATA:  EKG shows A-fib, left bundle branch block.  Dilantin level  8.1.  Sodium 132, potassium 3.8, chloride 97, bicarb 26, BUN 15, creatinine  1.2, glucose 158.  LFTs are fine.  White blood cell count 5.5, hemoglobin  13.6, platelet count 210.  PTT 26, PT 12.8, INR 1.  MRI shows acute right  anterior temporal lobe CVA, bilateral mixed age subdural, left greater than  right, chronic small small vessel disease.  MRA showed right ICA occlusion.   ASSESSMENT:  Patient with multiple medical  problems being admitted with  acute CVA.   PLAN:  1.  Admit to step down.  2.  Aspirin treatment only, too dangerous for increased anticoagulation.  3.  A-fib is currently rate controlled.  4.  2D echo to rule out clot.  5.  Risk factor reduction.  6.  Oxygen.  7.  Sliding scale on Actos.  8.  Dilantin 200 IV b.i.d. per Dr. Sandria Manly to prevent seizures.  9.  Source of this clot is probably from the ICA occlusion, less likely to      be from a thrombus leading to an emboli      from the heart due to the fact that it would have no place to go through      the right internal carotid artery, but will do workup appropriately      anyway.  10. Will probably be on ASA for 6 weeks followed by an angiogram.      John   JMR/MEDQ  D:  11/25/2003  T:  11/25/2003  Job:  [id number]   cc:   Loraine Leriche A. Waynard Edwards, M.D.  [address]  [place]  [place] [address]  Fax: [phone]   Danae Orleans. Venetia Maxon, M.D.  [address]  [place]  [place] [address]  Fax: [phone]   W. Ashley Royalty., M.D.  [address]  [place]  [place] [address]  Fax: [phone]   Genene Churn. Love, M.D.  [address]  [address]  [place]  [place] [address]  Fax: [phone]

## 2010-05-27 NOTE — Assessment & Plan Note (Signed)
MEDICAL RECORD NUMBER:  [id number].   John [name] is back regarding his left frontotemporal craniotomy and evacuation.   John [name] continues to improve.  He has been driving.  He has some decrease in  his endurance overall but otherwise has been doing very nicely.  Denies any  problems with cognition, sleep.  He is eating well.  He is back to his  normal activities.  He has really no concerns at this point.  He can walk  about 20 to 30 minutes at a time without having to stop.   REVIEW OF SYSTEMS:  This patient has some occasional numbness in the right  leg which is old.  Otherwise, he denies any neurological, psychiatric,  constitutional, GU, GI or cardiorespiratory problems today.   CURRENT MEDICATIONS:  Vytorin, Glucophage, Actos, folic acid, Altace,  Maxzide and Senokot p.r.n.   PHYSICAL EXAMINATION:  GENERAL APPEARANCE:  Patient is pleasant and in no  acute distress.  He is alert and oriented x3.  VITAL SIGNS:  Patient's blood pressure is 131/61, pulse 64, respiratory rate  16, he is sating 98% on room air.  LUNGS:  Clear.  CARDIOVASCULAR:  Regular rate and rhythm.  ABDOMEN:  Soft and nontender.  He is still obese. He had good balance with  gait today.  NEUROLOGIC:  Strength is generally 5/5 throughout.  Sensory exam was grossly  intact.  Cognition was normal.  Cranial nerves II-XII intact.  Visual acuity  was fair to stable.  He had a left forearm wound that was open and was  related to a skin (actinic keratoses) procedure that he had at a  dermatologist's last week.  Area had a lot of fibrinous material over top of  healing tissue.  There was some black eschar around the periphery.  Once  cleaned off, the area was bleeding from underneath.   ASSESSMENT:  1.  Status post bilateral subdural hematomas.  2.  Gait disorder.  3.  Improved language deficits.   PLAN:  1.  Will continue with home exercise program as tolerated.  2.  Gave the patient some recommendations regarding his  wound.  We did clean      the area off and apply Neosporin and cover with a Band-aid.  He should      clean the area with normal saline and 2x2 daily to clean off excess      fibrinous tissue.  If his area continues to drain, he needs to contact      his dermatologist.  3.  Otherwise, I have nothing further to offer this patient.  He has done      extremely well.  He will continue to follow-up with Dr. Loraine Leriche A. Perini      for his medical issues.       ZTS/MedQ  D:  08/02/2004 15:08:42  T:  08/02/2004 16:04:26  Job #:  [id number]   cc:   Loraine Leriche A. Waynard Edwards, M.D.  [address]  [place]  [place] [address]  Fax: [phone]

## 2010-05-27 NOTE — Procedures (Signed)
CLINICAL INFORMATION:  This patient has a history of bilateral subdural  hematomas, is status post right burr hole with removal of subdural, still  has some evidence of continued bilateral subdurals, and today developed left-  sided weakness.  Some was associated with left hand and arm jerking and  question of seizure versus stroke was raised.   TECHNICAL DESCRIPTION:  This EEG was recorded during the awake state.  The  background activity shows 9 and 10 Hz rhythms with higher amplitudes seen in  the posterior head regions bilaterally.  There is intermittent theta  activity on the right side associated with at times some potentiated sharp  waves, but no definite epileptiform activity present.  There is no definite  evidence of an electrographic seizure noted.  Photic stimulation did not  produce a driving response.  Hyperventilation testing was not performed.   IMPRESSION:  This is an abnormal EEG showing right-sided slowing in the  temporal region that can be seen with multiple etiologies and most  compatible with a destructive process.  No definite epileptiform activity  was seen.      UUV:OZDG  D:  11/25/2003 15:54:45  T:  11/26/2003 11:24:27  Job #:  [id number]

## 2010-05-27 NOTE — Discharge Summary (Signed)
John [name], John [name]                ACCOUNT NO.:  [id number]   MEDICAL RECORD NO.:  [id number]          PATIENT TYPE:  IPS   LOCATION:  4007                         FACILITY:  [facility]   PHYSICIAN:  Ranelle Oyster, M.D.DATE OF BIRTH:  November 08, 1922   DATE OF ADMISSION:  12/15/2003  DATE OF DISCHARGE:  12/25/2003                                 DISCHARGE SUMMARY   DISCHARGE DIAGNOSES:  1.  Recurrent subdural hemorrhage requiring craniotomy x2.  2.  Chronic atrial fibrillation.  3.  Diabetes mellitus, type 2.  4.  Left carotid occlusion.  5.  Pseudomonas urinary tract infection, treated.   HISTORY OF PRESENT ILLNESS:  John [name] is a 77 year old male with a history  of chronic atrial fibrillation, bur hole evacuation of a subdural on  November 14, 2003, with recent CVA November 15, 2003, discharged to home  November 27, 2003.  The patient with problems at home and followup CT of the  head done on 12/04/03 showed increase in left subdural hemorrhage with  chronic as well as subacute component.  The patient was taken to surgery Dr.  Danielle Dess on December 04, 2003, for evacuation.  Postoperatively, the patient  continued with fevers.  No signs of infection noted.  Followup CCT of  December 08, 2003, showed some reaccumulation of subdural fluid over the  left frontal lobe.  MRI on December 10, 2003 showed midline shift, left and  right with transfalcine herniation and mass effect.  The patient taken back  to the OR on December 10, 2003, for reevacuation of subdural hemorrhage by  Dr. Danielle Dess.  Postoperatively doing well.  Fevers continue and urine culture  is done which grew out greater than 100,000 colonies of Pseudomonas, treated  with IV antibiotics until today.  Followup CCT on December 14, 2003, shows  decrease in his residual left lateral subdural hemorrhage, decrease in  residual left frontal subdural fluid with left postoperative intracranial  air.  Therapies initiated and patient noted to be  a total assist for  transfers plus total assist for pivoting steps to chair, minimal to moderate  assist for upper body care, total assist for low body care.   PAST MEDICAL HISTORY:  1.  Chronic atrial fibrillation.  2.  Left ICA stenosis occlusion, right CVA November 2005.  3.  Bilateral subdural hematomas with bur hole evacuation.  4.  Diabetes mellitus, type 2.  5.  Hypertension.  6.  Benign prostatic hypertrophy.  7.  Hemorrhoidectomy.  8.  Microalbuminuria.  9.  Coronary artery bypass graft in 1998.  10. Dyslipidemia.  11. Hard of hearing.  12. Gilbert's syndrome.   ALLERGIES:  LIPITOR and ATENOLOL.   FAMILY HISTORY:  Noncontributory.   SOCIAL HISTORY:  The patient is married, lives in one level home with two  steps at entry.  He does not use any tobacco or alcohol.   HOSPITAL COURSE:  John [name] was admitted to rehabilitation on  December 15, 2003, for inpatient therapies to consist of PT, OT daily.  Post  admission, the patient was started on Cipro for a pseudomonas UTI and  treated with seven total days of antibiotic therapy.  Voiding was monitored  with PVR checks and no signs of retention.  The patient's cranium incision  was noted to be healing without any signs or symptoms of infection.  No  erythema noted.  The patient was maintained on Dilantin for seizure  prophylaxis recently.  Check on labs showed abnormal LFTs with AST of 125,  ALT at 111.  Dilantin levels were low therapeutic at 7.9.  Secondary to  sedation as well as abnormal LFTs, the patient was taken off Dilantin and  placed on Tegretol 100 mg t.i.d. initially. Tegretol level of December 24, 2003, was noted to be low therapeutic at 2.8.  Tegretol was increased to 200  mg p.o. t.i.d. with next Tegretol level and CBC to be checked in the next  few weeks.  Check of electrolytes last on December 24, 2003, and shows  abnormal LFTs to have resolved but SGOT 19, SGPT 31, total bilirubin 0.8 .  Sodium 138,  potassium 2.7, chloride 106, CO2 27.  BUN 8, creatinine 0.9.  The CBC done at admission reveals a hemoglobin of 10.5, hematocrit 29.5,  white count 4.5, platelets 269,000.   The patient's mentation improved.  Speech was noted to be more fluent with  expressive problems resolving.  The patient's basic comprehension and higher  level comprehension was intact.  The patient was able to follow multiple  step commands without difficulty.  Able to understand reading and written  material within functional limits.  He was advanced to a regular diet, thin  liquids without difficulty.  No dysphagia noted.  Basic expression was  intact.  He was noted to have some delay in responding to communication.  His responses revealed circumlocution with pauses in conversation.  Mild  difficulty with thought organization noted.  The patient's speech is clear  and sentence conversation level without signs of apraxia or dysarthria.  Recall showed some evidence of mild memory loss, otherwise with noted good  full function of information.  In terms of mobility, the patient progressed  to being at supervision level for transfers, close supervision for  ambulating 150 feet without assistive device with a rolling walker.  The  patient was at set-up assist for upper body care, minimal assist for low  body care.  Further followup therapy to include Home Health PT and OT by  [facility].  Of note, the patient was tapered off Lantus during his  stay, started on Glucophage 500 mg b.i.d. but this is increased to t.i.d.  basis for adequate blood sugar control. The patient is to continue  monitoring blood sugars b.i.d. to t.i.d. basis with followup with LMD for  further adjustments of medicines as needed.  On December 25, 2003, the  patient is discharged to home.   DISCHARGE MEDICATIONS:  1.  Tegretol 200 mg p.o. t.i.d.  2.  Glucophage 500 mg t.i.d.  3.  Ziac mg a day. 4.  Folic acid 2 mg a day.  5.  Senokot two  p.o. q.h.s.   ACTIVITY:  Supervision level.   DIET:  Diabetic diet.   ACTIVITY:  Check blood sugars b.i.d.   SPECIAL INSTRUCTIONS:  No alcohol, no driving,   FOLLOWUP:  1.  The patient is to follow up with Dr. Riley Kill on January 25, 205, at 11      a.m.  2.  Follow up with Dr. Waynard Edwards and Donnie Aho  in the next 2-3 weeks.  Follow up  with Dr. Danielle Dess in one month.  We will repeat at CT of the head.      Pame   PP/MEDQ  D:  12/25/2003  T:  12/28/2003  Job:  [id number]   cc:   Loraine Leriche A. Waynard Edwards, M.D.  [address]  [place]  [place] [address]  Fax: [phone]   W. Ashley Royalty., M.D.  [address]  [place]  [place] [address]  Fax: [phone]

## 2010-05-27 NOTE — Assessment & Plan Note (Signed)
DATE OF VISIT:  February 03, 2004.   MEDICAL RECORD NUMBER:  [id number].   DATE OF BIRTH:  12-08-1922.   [name] is back regarding his left frontotemporal craniotomy with  evacuation on December 04, 2003, and eventual reoperation on December 10, 2003.  The patient initially had a bur hole drainage of the right-sided  subdural in earlier November.  The patient presented to rehabilitation on  December 15, 2003, and was discharged on December 25, 2003, after making nice  improvements.  He struggled with some balance issues, as well as left-sided  weakness, but went home with his family.  The patient has finished up with  home health therapies at this point.  He finished up a week or so ago.  He  still complains of some problems with balance.  He uses his walker when  alone.  He often walks without device when family is nearby.  He walks  regularly two blocks nearby the house.  He still reports some dysarthria and  word finding deficits along with paraphasias.  He also notes some problems  scanning while reading.  Sugars have been under better control since being  home.  The patient denies any pain.  He is generally pleased with his  progress at this point.  He had a clean bill of health from Dr. Danielle Dess last  week.   SOCIAL HISTORY:  He lives with his wife who remains supportive and tends to  his needs as they may be.   REVIEW OF SYSTEMS:  The patient reports a history of heart troubles, high  sugars and frequent urination.  Other pertinent positives are listed above.  Full review of systems is in the health and history section of the chart.   CURRENT MEDICATIONS:  1.  Tegretol 200 mg t.i.d. for seizure prophylaxis.  2.  Glucophage 500 mg t.i.d.  3.  Actos 15 mg daily.  4.  Folic acid two tablets a day.  5.  Senokot two tablets at bedtime.  6.  He also appears to be taking Vytorin daily.   PHYSICAL EXAMINATION:  On physical examination today, the patient is  pleasant and in  no acute distress.  The blood pressure is 143/73, pulse 78,  respiratory rate 16 and saturating 97% on room air.  He ambulated with his  walker.  His affect is bright and alert.  Appearance is well kept.  He moves  all fours.  Today he is a bit weaker in the left leg, but no significantly.  There was some mild fine motor weakness in both upper and lower extremities,  but to a minimal extent.  The patient ambulated nicely with some initial  left foot, but accommodated for this well.  He walked without a device.  He  was able to change direction without device.  He transferred without  problems.  His sensory function appeared grossly intact.  Cranial nerve  function may be a bit diminished with some mild left facial droop and  dysarthria.  Visual fields were intact.  The patient had fair acuity.  Reflexes were 2+.  Cognition was within normal range.  He still had some  word finding deficits and aphasia with his speech.  The heart was regular  rate and rhythm.  The lungs were clear.  Abdomen soft and nontender.  The  extremities showed no cyanosis, clubbing or edema.   ASSESSMENT:  1.  Status post bilateral subdural hematomas with evacuation subsequently.  2.  Mild  balance deficits.  3.  Ongoing mild speech deficits in areas of word finding, paraphasias and      possible scanning difficulties with reading.  4.  Seizure prophylaxis with Tegretol.   PLAN:  1.  I would continue Tegretol for the next two to three months, after which      time he could come off of this medication.  2.  Recommend a trial of outpatient speech therapy to work on word finding      and reading problems.  I asked him to try some activities which require      focusing upon small objects at home to see if he still notes some of the      scanning problems.  If this is the case, it may be more of an      ophthalmological issue.  If not, this may be more of a reading/language      deficit which speech could help.  3.   Recommend moving on to a straight cane or continuing with his small-base      quad cane.  I do not believe that he needs a rolling walker at this      point.  I do not believe that he needs outpatient therapy at this point      either.  4.  I will see the patient back in three months' time.      ZTS/MedQ  D:  02/03/2004 12:50:50  T:  02/03/2004 13:27:51  Job #:  [id number]   cc:   Loraine Leriche A. Waynard Edwards, M.D.  [address]  [place]  [place] [address]  Fax: [phone]

## 2010-05-27 NOTE — Consult Note (Signed)
NAMEAADIT, HAGOOD                ACCOUNT NO.:  [id number]   MEDICAL RECORD NO.:  [id number]          PATIENT TYPE:  INP   LOCATION:                               FACILITY:  [facility]   PHYSICIAN:  Genene Churn. Love, M.D.    DATE OF BIRTH:  September 09, 1922   DATE OF CONSULTATION:  11/25/2003  DATE OF DISCHARGE:                                   CONSULTATION   PATIENT ADDRESS:  [address], [place], [place],  [address].   This 77 year old right-handed married white male is seen in the emergency  room and admitted by Dr. Waynard Edwards for reevaluation of history of subdural  hematomas and new onset left-sided numbness.   HISTORY OF PRESENT ILLNESS:  Mr. Mika has a known history of risk factors  for stroke including hyperlipidemia, diabetes mellitus for 16 years,  hypertension for 16 years, and coronary artery disease status post four  vessel coronary artery bypass surgery  in 1989.  He has had atrial  fibrillation treated with aspirin and Coumadin therapy and developed new  onset headaches in early November of 2005, for which he was admitted to  [facility] on November 13, 2003, with bilateral subdural  hematomas.  These were fairly small, probably less than 1 cm in diameter.  He underwent a right bur hole following correction of his coagulopathy on  November 14, 2003, by Dr. Venetia Maxon.  He did very well postoperatively and was  placed on Phenytoin medication.  Coumadin and aspirin were not restarted and  as an outpatient he noted the onset of episodes of numbness occurring three  to four times per day lasting 15 seconds involving his left hand without  associated jerking, headache, chest pain or palpitation.  The morning of  admission, he awoke about 7 a.m. and was noted not to be quite right  according to his wife but developed numbness on the left side involving his  arm and leg with some jerking of his left hand.  This was associated with  weakness and he was brought to  the [facility] Emergency  Room initially against  his wishes.  He noted no headache.  He states that  he has been on Dilantin 100 mg t.i.d., Actos 15 mg daily, Altace 10 mg  daily, Maxzide 25 mg daily, Vytorin 10/40 one half daily, multivitamins  daily.  He has not been on aspirin or Coumadin.  In the emergency room, he  has undergone a CT scan of the brain which shows evidence of the old chronic  and subacute hematoma on the left side with an 8.6 mm left frontal diameter.  There is also some blood in the right parietal occipital region and he is  status post right bur hole.   LABORATORY DATA:  Sodium 132, potassium 3.8, chloride 97, CO2 content 26,  glucose 158 which was high, BUN 15, creatinine 1.2, calcium 9.4, total  protein 6.8, albumin 3.7, SGOT 27, SGPT 18, alkaline phosphatase 64, total  bilirubin 0.6.  His white blood cell count was 5500, hemoglobin 13.6,  hematocrit  38.5, platelet count 210,000.  His PTT was 26, his prothrombin  time was 12.8 with INR of 1.0.   PHYSICAL EXAMINATION:  GENERAL APPEARANCE:  A well-developed white male in  no acute distress.  VITAL SIGNS:  Blood pressure in the right and left arm lying were 120/80 and  130/80.  Heart rate was 60 and irregular.  There were no bruits.  He was  afebrile.  NEUROLOGIC:  Mental status:  He was alert and oriented x3.  There was no  denial of illness.  His cranial nerve examination revealed visual fields  full.  He did not have any cataract surgery.  The discs were flat.  There  was decrease in left nasal labial fold.  Hearing was decreased bilaterally.  Tongue was midline.  Uvula was midline. Gags were present.  There was no  dysarthria.  Sternocleidomastoid and trapezius testing revealed some  decreased ability to lift his left arm at the shoulders.  Motor examination  revealed good strength without definite drift.  Sensory examination revealed  decreased two-point discrimination but pinprick was  apparently equal.  He  had decreased pinprick subjectively in the feet and legs bilaterally and  decreased vibration sense.  He had 2+ reflexes with absent ankle jerks and  downgoing plantar responses.   IMPRESSION:  1.  Left-sided numbness, code 782.0.  2.  Suspect right brain seizures by history, code 345.50.  Rule out stroke,      code 434.01.  3.  Bilateral subdural  hematomas, code 852.30.  Status post right bur hole      on November 14, 2003, with residual left greater than right subdural      hematoma both acute and subacute.  4.  Diabetes mellitus, code 350.6.  5.  Atrial fibrillation, code 427.31.  6.  Hypertension, code 796.2.  7.  Obesity, code 278.01.  8.  Coronary artery disease, code 429.2.   PLAN:  Check a phenytoin level.  Obtain MRI study and EEG.        ___________________________________________  Genene Churn. Sandria Manly, M.D.    JML/MEDQ  D:  11/25/2003  T:  11/25/2003  Job:  [id number]   cc:   Darden Palmer., M.D.  [address]  [place]  [place] [address]  Fax: [phone]   Redge Gainer. Waynard Edwards, M.D.  [address]  [place]  [place] [address]  Fax: [phone]

## 2010-07-18 ENCOUNTER — Other Ambulatory Visit: Payer: Self-pay | Admitting: Dermatology

## 2010-10-24 ENCOUNTER — Encounter: Payer: Self-pay | Admitting: Internal Medicine

## 2010-11-16 ENCOUNTER — Other Ambulatory Visit: Payer: Self-pay | Admitting: Internal Medicine

## 2010-11-30 ENCOUNTER — Other Ambulatory Visit: Payer: Self-pay | Admitting: Gastroenterology

## 2011-02-18 ENCOUNTER — Other Ambulatory Visit: Payer: Self-pay | Admitting: Cardiology

## 2011-03-03 ENCOUNTER — Encounter: Payer: Self-pay | Admitting: Internal Medicine

## 2011-06-19 ENCOUNTER — Encounter: Payer: Self-pay | Admitting: Cardiology

## 2011-06-19 ENCOUNTER — Other Ambulatory Visit: Payer: Self-pay

## 2011-06-19 ENCOUNTER — Ambulatory Visit (INDEPENDENT_AMBULATORY_CARE_PROVIDER_SITE_OTHER): Payer: Medicare Other | Admitting: Cardiology

## 2011-06-19 VITALS — BP 126/79 | HR 69 | Resp 18 | Ht 70.0 in | Wt 257.0 lb

## 2011-06-19 DIAGNOSIS — Z950 Presence of cardiac pacemaker: Secondary | ICD-10-CM

## 2011-06-19 DIAGNOSIS — I472 Ventricular tachycardia: Secondary | ICD-10-CM

## 2011-06-19 DIAGNOSIS — I4891 Unspecified atrial fibrillation: Secondary | ICD-10-CM

## 2011-06-19 LAB — PACEMAKER DEVICE OBSERVATION
RV LEAD AMPLITUDE: 9.5 mv
VENTRICULAR PACING PM: 97

## 2011-06-19 NOTE — Patient Instructions (Addendum)
Your physician wants you to follow-up in: 12 months with Dr. Klein. You will receive a reminder letter in the mail two months in advance. If you don't receive a letter, please call our office to schedule the follow-up appointment.  

## 2011-06-19 NOTE — Progress Notes (Signed)
ELECTROPHYSIOLOGY OFFICE NOTE  Patient ID: John [name] MRN: [id number], DOB/AGE: [birth year]   Date of Visit:  06/19/2011  Primary Physician: Rodrigo Ran, MD Primary Cardiologist: Viann Fish, MD Reason for Visit: Yearly device follow-up  History of Present Illness John [name] is a pleasant 78 year old gentleman with ICM, chronic systolic CHF, permanent AF and paroxysmal VT who presents today for device follow-up. He is accompanied by his wife. He is doing well and has no complaints. He denies chest pain, shortness of breath, palpitations, dizziness, near syncope or syncope. He denies pedal edema or abdominal swelling. He denies orthopnea or PND. He denies ICD shocks. He reports compliance with his medications and is routinely followed by Dr. Donnie Aho.  Past Medical History  Diagnosis Date  . Ventricular tachycardia     Syncope-2010 treated with amiodarone  . 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 Atrial fibrillation     Permanent  . HTN (hypertension)   . CAD (coronary artery disease)     s/p CABG; Failed LAD PCI 2010  . HLD (hyperlipidemia)   . BPH (benign prostatic hyperplasia)   . Decreased hearing   . Gilbert syndrome   . Carotid artery occlusion   . Dementia   . DM2 (diabetes mellitus, type 2)     Past Surgical History  Procedure Date  . Coronary artery bypass graft 1998  . Hemorrhoid surgery   . Pacemaker insertion     [employer]    Allergies/Intolerances No Known Allergies  Current Home Medications Current Outpatient Prescriptions  Medication Sig Dispense Refill  . alendronate (FOSAMAX) 70 MG tablet Take 70 mg by mouth every 7 (seven) days. Take with a full glass of water on an empty stomach.      Marland Kitchen amiodarone (PACERONE) 200 MG tablet Take 200 mg by mouth daily. Take 5 days a week      . aspirin 81 MG tablet Take 81 mg by mouth daily.        . carvedilol (COREG) 6.25 MG tablet TAKE 1 TABLET TWICE A DAY  180 tablet  2  . docusate sodium  (COLACE) 100 MG capsule Take 100 mg by mouth 3 (three) times daily.        . ergocalciferol (VITAMIN D2) 50000 UNITS capsule Take 50,000 Units by mouth once a week.        . ezetimibe (ZETIA) 10 MG tablet Take 10 mg by mouth daily.        . folic acid (FOLVITE) 1 MG tablet Take 1 mg by mouth 2 (two) times daily.        . memantine (NAMENDA) 10 MG tablet Take 10 mg by mouth 2 (two) times daily.       . metFORMIN (GLUCOPHAGE) 500 MG tablet Take 500 mg by mouth daily with breakfast.        . Multiple Vitamins-Minerals (EYE VITAMINS) CAPS Take by mouth 2 (two) times daily.        . pravastatin (PRAVACHOL) 40 MG tablet Take 40 mg by mouth daily.        . rivastigmine (EXELON) 4.6 mg/24hr Place 1 patch onto the skin daily.        Marland Kitchen triamterene-hydrochlorothiazide (MAXZIDE-25) 37.5-25 MG per tablet Take 0.5 tablets by mouth daily.        . vitamin B-12 (CYANOCOBALAMIN) 1000 MCG tablet Take 1,000 mcg by mouth daily.         Social History Social History  . Marital Status:  Married    Spouse Name: N/A    Number of Children: N/A  . Years of Education: N/A   Occupational History  . retired principal    Social History Main Topics  . Smoking status: Never Smoker   . Smokeless tobacco: Never Used  . Alcohol Use: No  . Drug Use: No  . Sexually Active: Not on file   Review of Systems General:  No chills, fever, night sweats or weight changes Cardiovascular:  No chest pain, dyspnea on exertion, edema, orthopnea, palpitations, paroxysmal nocturnal dyspnea Dermatological: No rash, lesions or masses Respiratory: No cough, dyspnea Urologic: No hematuria, dysuria Abdominal:   No nausea, vomiting, diarrhea, bright red blood per rectum, melena, or hematemesis Neurologic:  No visual changes, weakness, changes in mental status All other systems reviewed and are otherwise negative except as noted above.  Physical Exam Blood pressure 126/79, pulse 69, resp. rate 18, height 5\' 10"  (1.778 m), weight 257 lb  (116.574 kg). General: Well developed, well appearing 78 year old male in no acute distress. HEENT: Normocephalic, atraumatic. EOMs intact. Sclera nonicteric. Oropharynx clear.  Neck: Supple without bruits. No JVD. Lungs: Respirations regular and unlabored, CTA bilaterally. No wheezes, rales or rhonchi. Heart: RRR. S1, S2 present. No murmurs, rub, S3 or S4. Abdomen: Soft, non-distended. Extremities: No clubbing, cyanosis or edema. DP/PT/Radials 2+ and equal bilaterally. Psych: Normal affect. Neuro: Alert and oriented X 3. Moves all extremities spontaneously.   Diagnostics 12-lead ECG V paced at 70 bpm Device interrogation performed today shows normal biV PPM function with good battery status and stable lead measurements; see PaceArt report  Assessment and Plan 1. Ventricular tachycardia - stable; device function normal; no episodes on device check today; continue amiodarone per Dr. Donnie Aho 2. Atrial fibrillation - permanent; asymptomatic; not on chronic anticoagulation secondary to subdural hematoma 3. Chronic systolic CHF - euvolemic by exam today; patient denies any worsening CHF symptoms; continue medical therapy John [name] will return to clinic for device follow-up with Dr. Graciela Husbands in one year unless needed sooner.   Signed, Rick Duff, PA-C 06/19/2011, 12:49 PM

## 2011-07-04 ENCOUNTER — Other Ambulatory Visit: Payer: Self-pay | Admitting: Cardiology

## 2011-07-14 ENCOUNTER — Other Ambulatory Visit: Payer: Self-pay | Admitting: Cardiology

## 2011-07-21 ENCOUNTER — Other Ambulatory Visit: Payer: Self-pay | Admitting: Internal Medicine

## 2011-12-18 ENCOUNTER — Encounter: Payer: Self-pay | Admitting: Internal Medicine

## 2011-12-18 ENCOUNTER — Ambulatory Visit (INDEPENDENT_AMBULATORY_CARE_PROVIDER_SITE_OTHER): Payer: Medicare Other | Admitting: *Deleted

## 2011-12-18 DIAGNOSIS — Z950 Presence of cardiac pacemaker: Secondary | ICD-10-CM

## 2011-12-18 DIAGNOSIS — I495 Sick sinus syndrome: Secondary | ICD-10-CM

## 2011-12-18 LAB — PACEMAKER DEVICE OBSERVATION
DEVICE MODEL PM: 111352
LV LEAD AMPLITUDE: 9.5 mv
RV LEAD IMPEDENCE PM: 490 Ohm
VENTRICULAR PACING PM: 100

## 2011-12-18 NOTE — Patient Instructions (Signed)
Your physician wants you to follow-up in: 6 months with Dr. Klein. You will receive a reminder letter in the mail two months in advance. If you don't receive a letter, please call our office to schedule the follow-up appointment.  

## 2011-12-18 NOTE — Progress Notes (Signed)
Patient presents for device clinic pacemaker check.  No problems with shortness of breath, chest pain, palpitations, or syncope.  Device interrogated and found to be functioning normally.  No changes made today.  See PaceArt report for full details.  Plan ROV with Dr. Graciela Husbands in 6 months.  Gypsy Balsam, RN, BSN 12/18/2011 12:23 PM

## 2012-01-08 ENCOUNTER — Other Ambulatory Visit: Payer: Self-pay | Admitting: Internal Medicine

## 2012-06-18 ENCOUNTER — Ambulatory Visit (INDEPENDENT_AMBULATORY_CARE_PROVIDER_SITE_OTHER): Payer: Medicare Other | Admitting: Cardiology

## 2012-06-18 ENCOUNTER — Encounter: Payer: Self-pay | Admitting: Cardiology

## 2012-06-18 VITALS — BP 120/60 | HR 64 | Ht 68.0 in | Wt 255.8 lb

## 2012-06-18 DIAGNOSIS — Z5181 Encounter for therapeutic drug level monitoring: Secondary | ICD-10-CM

## 2012-06-18 DIAGNOSIS — Z95810 Presence of automatic (implantable) cardiac defibrillator: Secondary | ICD-10-CM

## 2012-06-18 DIAGNOSIS — Z79899 Other long term (current) drug therapy: Secondary | ICD-10-CM

## 2012-06-18 DIAGNOSIS — I472 Ventricular tachycardia, unspecified: Secondary | ICD-10-CM

## 2012-06-18 DIAGNOSIS — I5022 Chronic systolic (congestive) heart failure: Secondary | ICD-10-CM

## 2012-06-18 DIAGNOSIS — I4891 Unspecified atrial fibrillation: Secondary | ICD-10-CM

## 2012-06-18 LAB — HEPATIC FUNCTION PANEL
ALT: 14 U/L (ref 0–53)
Alkaline Phosphatase: 43 U/L (ref 39–117)
Bilirubin, Direct: 0.3 mg/dL (ref 0.0–0.3)
Total Protein: 7.4 g/dL (ref 6.0–8.3)

## 2012-06-18 LAB — PACEMAKER DEVICE OBSERVATION
LV LEAD THRESHOLD: 1.2 V
RV LEAD IMPEDENCE PM: 470 Ohm
RV LEAD THRESHOLD: 0.8 V
VENTRICULAR PACING PM: 99

## 2012-06-18 NOTE — Patient Instructions (Addendum)
Your physician recommends that you keep your follow-up appointment with Rick Duff, PA-C  Your physician recommends that you continue on your current medications as directed. Please refer to the Current Medication list given to you today.  Your physician recommends that you have  lab work today: tsh, lft

## 2012-06-18 NOTE — Progress Notes (Signed)
ELECTROPHYSIOLOGY OFFICE NOTE  Patient ID: John [name] MRN: [id number], DOB/AGE: October 21, 1922   Date of Visit: 06/18/2012  Primary Physician: Rodrigo Ran, MD Primary Cardiologist: Viann Fish, MD Reason for Visit: Yearly EP/device follow-up  History of Present Illness John [name] is a pleasant 79 year old gentleman with ICM, chronic systolic CHF, permanent AF and paroxysmal VT who presents today for device follow-up. He is accompanied by his wife. He is doing well and has no complaints. He denies chest pain, shortness of breath, palpitations, dizziness, near syncope or syncope. He denies pedal edema or abdominal swelling. He denies orthopnea or PND. He denies ICD shocks. He reports compliance with his medications and is routinely followed by Dr. Donnie Aho.  Past Medical History Past Medical History  Diagnosis Date  . Ventricular tachycardia     Syncope-2010 treated with amiodarone  . 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 Atrial fibrillation     Permanent  . HTN (hypertension)   . CAD (coronary artery disease)     s/p CABG; Failed LAD PCI 2010  . HLD (hyperlipidemia)   . BPH (benign prostatic hyperplasia)   . Decreased hearing   . Gilbert syndrome   . Carotid artery occlusion   . Dementia   . DM2 (diabetes mellitus, type 2)     Past Surgical History Past Surgical History  Procedure Laterality Date  . Coronary artery bypass graft  1998  . Hemorrhoid surgery    . Pacemaker insertion      [facility]    Allergies/Intolerances No Known Allergies  Current Home Medications Current Outpatient Prescriptions  Medication Sig Dispense Refill  . amiodarone (PACERONE) 200 MG tablet Take 200 mg by mouth daily. Take 5 days a week      . aspirin 81 MG tablet Take 81 mg by mouth daily.        . carvedilol (COREG) 6.25 MG tablet TAKE 1 TABLET TWICE A DAY  180 tablet  0  . ergocalciferol (VITAMIN D2) 50000 UNITS capsule Take 50,000 Units by mouth once a week.        .  folic acid (FOLVITE) 1 MG tablet Take 1 mg by mouth 2 (two) times daily.        . memantine (NAMENDA) 10 MG tablet Take 10 mg by mouth 2 (two) times daily.       . metFORMIN (GLUCOPHAGE) 500 MG tablet Take 500 mg by mouth daily with breakfast.        . Multiple Vitamins-Minerals (EYE VITAMINS) CAPS Take by mouth 2 (two) times daily.        . pravastatin (PRAVACHOL) 40 MG tablet Take 40 mg by mouth daily.        . rivastigmine (EXELON) 4.6 mg/24hr Place 1 patch onto the skin daily.        . sennosides-docusate sodium (SENOKOT-S) 8.6-50 MG tablet Take 1 tablet by mouth at bedtime.      . triamterene-hydrochlorothiazide (MAXZIDE-25) 37.5-25 MG per tablet Take 0.5 tablets by mouth daily.        . vitamin B-12 (CYANOCOBALAMIN) 1000 MCG tablet Take 1,000 mcg by mouth daily.         No current facility-administered medications for this visit.   Social History Social History  . Marital Status: Married   Occupational History  . retired principal    Social History Main Topics  . Smoking status: Never Smoker   . Smokeless tobacco: Never Used  . Alcohol Use: No  .  Drug Use: No   Review of Systems General: No chills, fever, night sweats or weight changes Cardiovascular: No chest pain, dyspnea on exertion, edema, orthopnea, palpitations, paroxysmal nocturnal dyspnea Dermatological: No rash, lesions or masses Respiratory: No cough, dyspnea Urologic: No hematuria, dysuria Abdominal: No nausea, vomiting, diarrhea, bright red blood per rectum, melena, or hematemesis Neurologic: No visual changes, weakness, changes in mental status All other systems reviewed and are otherwise negative except as noted above.  Physical Exam Blood pressure 120/60, pulse 64, height 5\' 8"  (1.727 m), weight 255 lb 12.8 oz (116.03 kg).  General: Well developed, well appearing 79 year old male in no acute distress. HEENT: Normocephalic, atraumatic. EOMs intact. Sclera nonicteric. Oropharynx clear.  Neck: Supple without  bruits. No JVD. Lungs: Respirations regular and unlabored, CTA bilaterally. No wheezes, rales or rhonchi. Heart: RRR. S1, S2 present. No murmurs, rub, S3 or S4. Abdomen: Soft, non-distended.  Extremities: No clubbing, cyanosis or edema. PT/Radials 2+ and equal bilaterally. Psych: Normal affect. Neuro: Alert and oriented X 3. Moves all extremities spontaneously.   Diagnostics Labs from [facility] Dec 2013 - LFTs, TSH within normal range Device interrogation today - Normal device function. Thresholds, sensing, impedance consistent with previous measurements. Histograms appropriate for patient and level of activity. No ventricular high rate episodes. Patient bi-ventricularly pacing 99% of the time. Device programmed with appropriate safety margins. Device heart failure diagnostics are within normal limits and stable over time. Estimated longevity 5 years. ROV in 6 months.  Assessment and Plan 1. Ischemic CM s/p ICD implant Normal device function No programming changes made Return to clinic for device follow-up in 6 months 2. Ventricular tachycardia  Stable No episodes on device interrogation today Continue amiodarone and will order routine surveillence labs today - LFTs, TSH   3. Permanent atrial fibrillation Asymptomatic Continue current regimen Not on chronic anticoagulation secondary to subdural hematoma  4. Chronic systolic CHF Euvolemic by exam today and denies any worsening CHF symptoms Continue medical therapy   Signed, Rick Duff, PA-C 06/18/2012, 1:41 PM

## 2012-06-19 ENCOUNTER — Encounter: Payer: Self-pay | Admitting: Cardiology

## 2012-06-20 ENCOUNTER — Other Ambulatory Visit: Payer: Self-pay

## 2012-06-20 MED ORDER — CARVEDILOL 6.25 MG PO TABS: 6.2500 mg | ORAL_TABLET | Freq: Two times a day (BID) | ORAL | Status: DC

## 2012-06-20 NOTE — Telephone Encounter (Signed)
carvedilol (COREG) 6.25 MG tablet  TAKE 1 TABLET TWICE A DAY  180 tablet

## 2012-07-03 ENCOUNTER — Encounter: Payer: Self-pay | Admitting: Internal Medicine

## 2012-07-04 ENCOUNTER — Other Ambulatory Visit: Payer: Self-pay

## 2012-07-04 MED ORDER — MEMANTINE HCL ER 28 MG PO CP24: 28.0000 mg | ORAL_CAPSULE | Freq: Every day | ORAL | Status: DC

## 2012-08-07 ENCOUNTER — Encounter: Payer: Self-pay | Admitting: Internal Medicine

## 2012-09-18 ENCOUNTER — Encounter: Payer: Self-pay | Admitting: Nurse Practitioner

## 2012-09-18 ENCOUNTER — Ambulatory Visit (INDEPENDENT_AMBULATORY_CARE_PROVIDER_SITE_OTHER): Payer: Medicare Other | Admitting: Nurse Practitioner

## 2012-09-18 VITALS — BP 143/77 | HR 70 | Ht 66.5 in | Wt 255.0 lb

## 2012-09-18 DIAGNOSIS — F0150 Vascular dementia without behavioral disturbance: Secondary | ICD-10-CM

## 2012-09-18 DIAGNOSIS — I6990 Unspecified sequelae of unspecified cerebrovascular disease: Secondary | ICD-10-CM | POA: Insufficient documentation

## 2012-09-18 NOTE — Patient Instructions (Addendum)
Continue Exelon patch  please verify dose, 9.5mg  when last seen now 4.6mg  in system and I do not see where a change was made Continue Namenda XR at 28mg  daily Cane at all times for ambulation for safety Followup in 6 months

## 2012-09-18 NOTE — Progress Notes (Signed)
[facility]  PATIENT: John [name] DOB: [birth year]   REASON FOR VISIT: Followup for dementia    HISTORY OF PRESENT ILLNESS:79 year old male with a history of Right thalamic  hemmorage with intravascular extension without hydrocephalous in Feb 2011 of hypertensive origin. Mild vascular dementia which is worsening also need to consider may be age related cognitive impairment or early Alzheimer's, difficult to determine with history.  History of bilateral subdural hematomas in the past. He still has significant short-term memory difficulties. He needs close supervision but can be left alone for a few hours. He can eat, dress himself and attend to his toilet needs but needs his clothes and medicines laid out for him. He walks with a cane and stumbles a lot but usually can prevent himself from falling. He has had no safety issues. He does not have any agitation or behavioral problems. He   is tolerating Namenda 10 mg twice a day and Exelon patch 4,6  mg/24-hour, however there is a discrepancy in his toes as he was on 9.5 last visit 03/12/2012. Son who accompanies him today Will check and give [place] a call back so that we can update his record. There has not been hallucinations or wandering behavior. Sleeping well, appetite good. Tolerating Namenda and Exelon without difficulty.     REVIEW OF SYSTEMS: Full 14 system review of systems performed and notable only for:  Constitutional: N/A  Cardiovascular: N/A  Ear/Nose/Throat: Hearing loss Skin: N/A  Eyes: N/A  Respiratory: Snoring Gastroitestinal: N/A  Hematology/Lymphatic: N/A  Endocrine: N/A Musculoskeletal:N/A  Allergy/Immunology: N/A  Neurological: Memory loss Psychiatric: Disinterest in activities, decreased energy   ALLERGIES: No Known Allergies  HOME MEDICATIONS: Outpatient Prescriptions Prior to Visit  Medication Sig Dispense Refill  . amiodarone (PACERONE) 200 MG tablet Take 200 mg by mouth daily. Take 5 days  a week      . aspirin 81 MG tablet Take 81 mg by mouth daily.        . carvedilol (COREG) 6.25 MG tablet Take 1 tablet (6.25 mg total) by mouth 2 (two) times daily with a meal.  60 tablet  3  . ergocalciferol (VITAMIN D2) 50000 UNITS capsule Take 50,000 Units by mouth once a week.        . folic acid (FOLVITE) 1 MG tablet Take 1 mg by mouth 2 (two) times daily.        . Memantine HCl ER (NAMENDA XR) 28 MG CP24 Take 28 mg by mouth daily.  90 capsule  1  . metFORMIN (GLUCOPHAGE) 500 MG tablet Take 500 mg by mouth daily with breakfast.        . Multiple Vitamins-Minerals (EYE VITAMINS) CAPS Take by mouth 2 (two) times daily.        . pravastatin (PRAVACHOL) 40 MG tablet Take 40 mg by mouth daily.        . rivastigmine (EXELON) 4.6 mg/24hr Place 1 patch onto the skin daily.        . sennosides-docusate sodium (SENOKOT-S) 8.6-50 MG tablet Take 1 tablet by mouth at bedtime.      . triamterene-hydrochlorothiazide (MAXZIDE-25) 37.5-25 MG per tablet Take 0.5 tablets by mouth daily.        . vitamin B-12 (CYANOCOBALAMIN) 1000 MCG tablet Take 1,000 mcg by mouth daily.         No facility-administered medications prior to visit.    PAST MEDICAL HISTORY: Past Medical History  Diagnosis Date  . Ventricular tachycardia  Syncope-2010 treated with amiodarone  . 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 Atrial fibrillation     Permanent  . HTN (hypertension)   . CAD (coronary artery disease)     s/p CABG; Failed LAD PCI 2010  . HLD (hyperlipidemia)   . BPH (benign prostatic hyperplasia)   . Decreased hearing   . Gilbert syndrome   . Carotid artery occlusion   . Dementia   . DM2 (diabetes mellitus, type 2)   . Memory loss     PAST SURGICAL HISTORY: Past Surgical History  Procedure Laterality Date  . Coronary artery bypass graft  1998  . Hemorrhoid surgery    . Pacemaker insertion      [facility]    FAMILY HISTORY: Family History  Problem Relation Age of Onset  .  Coronary artery disease Mother   . Cancer Father     SOCIAL HISTORY: History   Social History  . Marital Status: Married    Spouse Name: N/A    Number of Children: N/A  . Years of Education: N/A   Occupational History  . retired principal    Social History Main Topics  . Smoking status: Never Smoker   . Smokeless tobacco: Never Used  . Alcohol Use: No  . Drug Use: No  . Sexual Activity: Not on file   Other Topics Concern  . Not on file   Social History Narrative  . No narrative on file     PHYSICAL EXAM  Filed Vitals:   09/18/12 1421  BP: 143/77  Pulse: 70  Height: 5' 6.5" (1.689 m)  Weight: 255 lb (115.667 kg)   Body mass index is 40.55 kg/(m^2).  Generalized: Well developed, obese male in no acute distress  Head: normocephalic and atraumatic,. Oropharynx benign  Neck: Supple, no carotid bruits  Cardiac: Regular rate rhythm, no murmur  Musculoskeletal: No deformity   Neurological examination   Mentation: Alert, MMSE 22/30 missing  all recall items. AFT 8.  Follows all commands speech and language fluent  Cranial nerve II-XII: Pupils were equal round reactive to light extraocular movements were full, visual field were full on confrontational test. Facial sensation and strength were normal. hearing decreased bil.  Uvula tongue midline. head turning and shoulder shrug and were normal and symmetric.Tongue protrusion into cheek strength was normal. Motor: normal bulk and tone, full strength in the BUE, BLE, mild diminished fine finger movements  no pronator drift. No focal weakness Sensory: normal and symmetric to light touch, pinprick, and  vibration  Coordination: finger-nose-finger, heel-to-shin bilaterally, no dysmetria Reflexes: Brachioradialis 2/2, biceps 2/2, triceps 2/2, patellar 2/2, Achilles 2/2, plantar responses were flexor bilaterally. Gait and Station: Rising up from seated position without assistance, wide based gait, uses cane to ambulate no  difficulty with turns DIAGNOSTIC DATA (LABS, IMAGING, TESTING) - I reviewed patient records, labs, notes, testing and imaging myself where available.   ASSESSMENT AND PLAN  79 y.o. year old male  has a past medical history of Subdural hematoma (11/05); Atrial fibrillation; HTN (hypertension); CAD (coronary artery disease); HLD (hyperlipidemia); Decreased hearing; Sullivan Lone syndrome; Carotid artery occlusion;  DM2 (diabetes mellitus, type 2); and Memory loss.   Continue Exelon patch  please verify dose, 9.5mg  when last seen now 4.6mg  in system and I do not see where a change was made Continue Namenda XR at 28mg  daily Cane at all times for ambulation for safety Followup in 6 months  Nilda Riggs, [facility], [facility], APRN  [facility] [address] [place],  [address] [phone]

## 2012-09-23 ENCOUNTER — Other Ambulatory Visit: Payer: Self-pay | Admitting: Internal Medicine

## 2012-11-27 ENCOUNTER — Other Ambulatory Visit: Payer: Self-pay | Admitting: Internal Medicine

## 2012-12-19 ENCOUNTER — Encounter: Payer: Medicare Other | Admitting: Cardiology

## 2012-12-20 ENCOUNTER — Other Ambulatory Visit: Payer: Self-pay | Admitting: Internal Medicine

## 2012-12-24 ENCOUNTER — Ambulatory Visit (INDEPENDENT_AMBULATORY_CARE_PROVIDER_SITE_OTHER): Payer: Medicare Other | Admitting: Cardiology

## 2012-12-24 ENCOUNTER — Encounter: Payer: Self-pay | Admitting: Cardiology

## 2012-12-24 VITALS — BP 110/70 | HR 71 | Ht 66.5 in | Wt 260.0 lb

## 2012-12-24 DIAGNOSIS — I2589 Other forms of chronic ischemic heart disease: Secondary | ICD-10-CM

## 2012-12-24 DIAGNOSIS — Z950 Presence of cardiac pacemaker: Secondary | ICD-10-CM

## 2012-12-24 DIAGNOSIS — I5022 Chronic systolic (congestive) heart failure: Secondary | ICD-10-CM

## 2012-12-24 DIAGNOSIS — I4891 Unspecified atrial fibrillation: Secondary | ICD-10-CM

## 2012-12-24 DIAGNOSIS — I255 Ischemic cardiomyopathy: Secondary | ICD-10-CM

## 2012-12-24 DIAGNOSIS — I472 Ventricular tachycardia: Secondary | ICD-10-CM

## 2012-12-24 LAB — MDC_IDC_ENUM_SESS_TYPE_INCLINIC
Brady Statistic RA Percent Paced: 0 %
Lead Channel Impedance Value: 2500 Ohm
Lead Channel Impedance Value: 435 Ohm
Lead Channel Impedance Value: 820 Ohm
Lead Channel Pacing Threshold Amplitude: 1.2 V
Lead Channel Pacing Threshold Pulse Width: 0.4 ms
Lead Channel Pacing Threshold Pulse Width: 0.5 ms
Lead Channel Sensing Intrinsic Amplitude: 9.5 mV
Lead Channel Setting Pacing Amplitude: 2.4 V
Lead Channel Setting Pacing Pulse Width: 0.4 ms
Lead Channel Setting Pacing Pulse Width: 0.5 ms
Lead Channel Setting Sensing Sensitivity: 2.5 mV

## 2012-12-24 NOTE — Progress Notes (Signed)
Patient ID: ABELINO TIPPIN MRN: [id number], DOB/AGE: 08/07/1922   Date of Visit: 12/24/2012  Primary Physician: Rodrigo Ran, MD Primary Cardiologist: Viann Fish, MD Primary EP: Berton Mount, MD Reason for Visit: EP/device follow-up  History of Present Illness Mr. Sawin is a pleasant 79 year old gentleman with ICM s/p CRT-P implant, chronic systolic CHF, permanent AF and paroxysmal VT who presents today for device follow-up. He is accompanied by his wife. He reports he is doing well and has no complaints. He denies chest pain, shortness of breath, palpitations, dizziness, near syncope or syncope. He denies pedal edema or abdominal swelling. He denies orthopnea or PND. He reports compliance with his medications and is routinely followed by Dr. Donnie Aho.  Past Medical History Past Medical History  Diagnosis Date  . Ventricular tachycardia     Syncope-2010 treated with amiodarone  . 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 Atrial fibrillation     Permanent  . HTN (hypertension)   . CAD (coronary artery disease)     s/p CABG; Failed LAD PCI 2010  . HLD (hyperlipidemia)   . BPH (benign prostatic hyperplasia)   . Decreased hearing   . Gilbert syndrome   . Carotid artery occlusion   . Dementia   . DM2 (diabetes mellitus, type 2)   . Memory loss     Past Surgical History Past Surgical History  Procedure Laterality Date  . Coronary artery bypass graft  1998  . Hemorrhoid surgery    . Pacemaker insertion      [facility]    Allergies/Intolerances No Known Allergies  Current Home Medications Current Outpatient Prescriptions  Medication Sig Dispense Refill  . amiodarone (PACERONE) 200 MG tablet Take 200 mg by mouth daily. Take 5 days a week      . aspirin 81 MG tablet Take 81 mg by mouth daily.        . carvedilol (COREG) 6.25 MG tablet TAKE 1 TABLET BY MOUTH TWICE DAILY WITH A MEAL  60 tablet  0  . ergocalciferol (VITAMIN D2) 50000 UNITS capsule Take 50,000  Units by mouth once a week.        . folic acid (FOLVITE) 1 MG tablet Take 1 mg by mouth 2 (two) times daily.        . Memantine HCl ER (NAMENDA XR) 28 MG CP24 Take 28 mg by mouth daily.  90 capsule  1  . metFORMIN (GLUCOPHAGE) 500 MG tablet Take 500 mg by mouth daily with breakfast.        . Multiple Vitamins-Minerals (EYE VITAMINS) CAPS Take by mouth 2 (two) times daily.        . pravastatin (PRAVACHOL) 40 MG tablet Take 40 mg by mouth daily.        . rivastigmine (EXELON) 4.6 mg/24hr Place 1 patch onto the skin daily.        . sennosides-docusate sodium (SENOKOT-S) 8.6-50 MG tablet Take 1 tablet by mouth at bedtime.      . triamterene-hydrochlorothiazide (MAXZIDE-25) 37.5-25 MG per tablet Take 0.5 tablets by mouth daily.        . vitamin B-12 (CYANOCOBALAMIN) 1000 MCG tablet Take 1,000 mcg by mouth daily.         No current facility-administered medications for this visit.    Social History History   Social History  . Marital Status: Married    Spouse Name: N/A    Number of Children: N/A  . Years of Education: N/A  Occupational History  . retired principal    Social History Main Topics  . Smoking status: Never Smoker   . Smokeless tobacco: Never Used  . Alcohol Use: No  . Drug Use: No  . Sexual Activity: Not on file   Other Topics Concern  . Not on file   Social History Narrative  . No narrative on file     Review of Systems General: No chills, fever, night sweats or weight changes Cardiovascular: No chest pain, dyspnea on exertion, edema, orthopnea, palpitations, paroxysmal nocturnal dyspnea Dermatological: No rash, lesions or masses Respiratory: No cough, dyspnea Urologic: No hematuria, dysuria Abdominal: No nausea, vomiting, diarrhea, bright red blood per rectum, melena, or hematemesis Neurologic: No visual changes, weakness, changes in mental status All other systems reviewed and are otherwise negative except as noted above.  Physical Exam Vitals: Blood  pressure 110/70, pulse 71, height 5' 6.5" (1.689 m), weight 260 lb (117.935 kg), SpO2 99.00%.  General: Well developed, well appearing 79 y.o. male in no acute distress. HEENT: Normocephalic, atraumatic. EOMs intact. Sclera nonicteric. Oropharynx clear.  Neck: Supple. No JVD. Lungs: Respirations regular and unlabored, CTA bilaterally. No wheezes, rales or rhonchi. Heart: RRR. S1, S2 present. No murmurs, rub, S3 or S4. Abdomen: Soft, non-distended.  Extremities: No clubbing, cyanosis or edema. PT/Radials 2+ and equal bilaterally. Psych: Normal affect. Neuro: Alert and oriented X 3. Moves all extremities spontaneously. Skin: Left upper chest / implant site intact and well healed.   Diagnostics Device interrogation today - Normal device function. Thresholds, sensing, impedance consistent with previous measurements. Histograms appropriate for patient and level of activity. No high ventricular high rate episodes. Patient bi-ventricularly pacing 98% of the time. Device programmed with appropriate safety margins. Device heart failure diagnostics are within normal limits and stable over time. Estimated longevity 4.5 years.   Assessment and Plan 1. Ischemic CM s/p CRT-P implant in 2010 Normal device function No programming changes made  Return to clinic for device follow-up in 6 months  2. Ventricular tachycardia  Stable  No episodes on device interrogation today  Continue amiodarone and will order routine surveillence labs today - LFTs, TSH  3. Permanent atrial fibrillation  Asymptomatic  Continue current regimen  Not on chronic anticoagulation secondary to subdural hematoma  4. Chronic systolic CHF  Euvolemic by exam today and denies any worsening CHF symptoms  Continue medical therapy   Signed, Rick Duff, PA-C 12/24/2012, 2:56 PM

## 2012-12-24 NOTE — Patient Instructions (Signed)
Your physician wants you to follow-up in: 1 YEAR WITH DR. Logan Bores will receive a reminder letter in the mail two months in advance. If you don't receive a letter, please call our office to schedule the follow-up appointment.  Your physician wants you to follow-up in: 6 MONTHS WITH DEVICE CLINIC You will receive a reminder letter in the mail two months in advance. If you don't receive a letter, please call our office to schedule the follow-up appointment.  Your physician recommends that you continue on your current medications as directed. Please refer to the Current Medication list given to you today.

## 2012-12-31 ENCOUNTER — Other Ambulatory Visit: Payer: Self-pay | Admitting: Internal Medicine

## 2013-01-06 ENCOUNTER — Encounter: Payer: Self-pay | Admitting: Internal Medicine

## 2013-02-07 ENCOUNTER — Other Ambulatory Visit: Payer: Self-pay | Admitting: Internal Medicine

## 2013-02-07 ENCOUNTER — Other Ambulatory Visit: Payer: Self-pay | Admitting: Neurology

## 2013-03-19 ENCOUNTER — Ambulatory Visit: Payer: Medicare Other | Admitting: Nurse Practitioner

## 2013-04-30 ENCOUNTER — Emergency Department (HOSPITAL_COMMUNITY)
Admission: EM | Admit: 2013-04-30 | Discharge: 2013-04-30 | Disposition: A | Discharge status: Home / Self Care | Payer: Medicare Other | Attending: Emergency Medicine | Admitting: Emergency Medicine

## 2013-04-30 ENCOUNTER — Encounter (HOSPITAL_COMMUNITY): Payer: Self-pay | Admitting: Emergency Medicine

## 2013-04-30 ENCOUNTER — Emergency Department (HOSPITAL_COMMUNITY): Payer: Medicare Other

## 2013-04-30 DIAGNOSIS — E119 Type 2 diabetes mellitus without complications: Secondary | ICD-10-CM | POA: Insufficient documentation

## 2013-04-30 DIAGNOSIS — Y9389 Activity, other specified: Secondary | ICD-10-CM | POA: Insufficient documentation

## 2013-04-30 DIAGNOSIS — R296 Repeated falls: Secondary | ICD-10-CM | POA: Insufficient documentation

## 2013-04-30 DIAGNOSIS — I10 Essential (primary) hypertension: Secondary | ICD-10-CM | POA: Insufficient documentation

## 2013-04-30 DIAGNOSIS — Z7982 Long term (current) use of aspirin: Secondary | ICD-10-CM | POA: Insufficient documentation

## 2013-04-30 DIAGNOSIS — Z87448 Personal history of other diseases of urinary system: Secondary | ICD-10-CM | POA: Insufficient documentation

## 2013-04-30 DIAGNOSIS — I4891 Unspecified atrial fibrillation: Secondary | ICD-10-CM | POA: Insufficient documentation

## 2013-04-30 DIAGNOSIS — I2510 Atherosclerotic heart disease of native coronary artery without angina pectoris: Secondary | ICD-10-CM | POA: Insufficient documentation

## 2013-04-30 DIAGNOSIS — Z8669 Personal history of other diseases of the nervous system and sense organs: Secondary | ICD-10-CM | POA: Insufficient documentation

## 2013-04-30 DIAGNOSIS — Z951 Presence of aortocoronary bypass graft: Secondary | ICD-10-CM | POA: Insufficient documentation

## 2013-04-30 DIAGNOSIS — S20219A Contusion of unspecified front wall of thorax, initial encounter: Secondary | ICD-10-CM | POA: Insufficient documentation

## 2013-04-30 DIAGNOSIS — F0390 Unspecified dementia without behavioral disturbance: Secondary | ICD-10-CM | POA: Insufficient documentation

## 2013-04-30 DIAGNOSIS — Z79899 Other long term (current) drug therapy: Secondary | ICD-10-CM | POA: Insufficient documentation

## 2013-04-30 DIAGNOSIS — E785 Hyperlipidemia, unspecified: Secondary | ICD-10-CM | POA: Insufficient documentation

## 2013-04-30 DIAGNOSIS — Y929 Unspecified place or not applicable: Secondary | ICD-10-CM | POA: Insufficient documentation

## 2013-04-30 MED ORDER — DOCUSATE SODIUM 100 MG PO CAPS: 100.0000 mg | ORAL_CAPSULE | Freq: Two times a day (BID) | ORAL | Status: DC

## 2013-04-30 MED ORDER — OXYCODONE-ACETAMINOPHEN 5-325 MG PO TABS
2.0000 | ORAL_TABLET | Freq: Once | ORAL | Status: AC
  Administered 2013-04-30: 2 via ORAL
  Filled 2013-04-30: qty 2

## 2013-04-30 MED ORDER — OXYCODONE-ACETAMINOPHEN 5-325 MG PO TABS: 1.0000 | ORAL_TABLET | ORAL | Status: DC | PRN

## 2013-04-30 NOTE — ED Notes (Signed)
Pt reports that he fell getting up out of bed x1 week ago. Pt was evaluated by EMS at that time, as he was unable to get up by himself. Pt refused to go to the ED at that time. Pt has continued to have R sided flank pain near his ribs, especially with deep inspiration, since that time. His PCP felt he needed to be evaluated for possible fractured ribs. Pt a&o x4, ambulatory with a cane at this time.

## 2013-04-30 NOTE — Discharge Instructions (Signed)
Chest Contusion °A chest contusion is a deep bruise on your chest area. Contusions are the result of an injury that caused bleeding under the skin. A chest contusion may involve bruising of the skin, muscles, or ribs. The contusion may turn blue, purple, or yellow. Minor injuries will give you a painless contusion, but more severe contusions may stay painful and swollen for a few weeks. °CAUSES  °A contusion is usually caused by a blow, trauma, or direct force to an area of the body. °SYMPTOMS  °· Swelling and redness of the injured area. °· Discoloration of the injured area. °· Tenderness and soreness of the injured area. °· Pain. °DIAGNOSIS  °The diagnosis can be made by taking a history and performing a physical exam. An X-ray, CT scan, or MRI may be needed to determine if there were any associated injuries, such as broken bones (fractures) or internal injuries. °TREATMENT  °Often, the best treatment for a chest contusion is resting, icing, and applying cold compresses to the injured area. Deep breathing exercises may be recommended to reduce the risk of pneumonia. Over-the-counter medicines may also be recommended for pain control. °HOME CARE INSTRUCTIONS  °· Put ice on the injured area. °· Put ice in a plastic bag. °· Place a towel between your skin and the bag. °· Leave the ice on for 15-20 minutes, 03-04 times a day. °· Only take over-the-counter or prescription medicines as directed by your caregiver. Your caregiver may recommend avoiding anti-inflammatory medicines (aspirin, ibuprofen, and naproxen) for 48 hours because these medicines may increase bruising. °· Rest the injured area. °· Perform deep-breathing exercises as directed by your caregiver. °· Stop smoking if you smoke. °· Do not lift objects over 5 pounds (2.3 kg) for 3 days or longer if recommended by your caregiver. °SEEK IMMEDIATE MEDICAL CARE IF:  °· You have increased bruising or swelling. °· You have pain that is getting worse. °· You have  difficulty breathing. °· You have dizziness, weakness, or fainting. °· You have blood in your urine or stool. °· You cough up or vomit blood. °· Your swelling or pain is not relieved with medicines. °MAKE SURE YOU:  °· Understand these instructions. °· Will watch your condition. °· Will get help right away if you are not doing well or get worse. °Document Released: 09/20/2000 Document Revised: 09/20/2011 Document Reviewed: 06/19/2011 °[employer]® Patient Information ©2014 [employer]. ° °

## 2013-05-06 NOTE — ED Provider Notes (Signed)
CSN: [id number]     Arrival date & time 04/30/13  1919 History   First MD Initiated Contact with Patient 04/30/13 2042     Chief Complaint  Patient presents with  . Fall     (Consider location/radiation/quality/duration/timing/severity/associated sxs/prior Treatment) HPI  80-year-old male with chest pain. Onset after a fall with getting up from bed approximately one week ago. Initially refused treatment. Patient states his symptoms have not significantly improved. Pain is worse with movement, palpation and deep inspiration. He feels that his breathing is fine otherwise. Denies any rales. No nausea or vomiting. No dizziness or lightheadedness.  Past Medical History  Diagnosis Date  . Ventricular tachycardia     Syncope-2010 treated with amiodarone  . 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 Atrial fibrillation     Permanent  . HTN (hypertension)   . CAD (coronary artery disease)     s/p CABG; Failed LAD PCI 2010  . HLD (hyperlipidemia)   . BPH (benign prostatic hyperplasia)   . Decreased hearing   . Gilbert syndrome   . Carotid artery occlusion   . Dementia   . DM2 (diabetes mellitus, type 2)   . Memory loss    Past Surgical History  Procedure Laterality Date  . Coronary artery bypass graft  1998  . Hemorrhoid surgery    . Pacemaker insertion      [facility]   Family History  Problem Relation Age of Onset  . Coronary artery disease Mother   . Cancer Father    History  Substance Use Topics  . Smoking status: Never Smoker   . Smokeless tobacco: Never Used  . Alcohol Use: No    Review of Systems  All systems reviewed and negative, other than as noted in HPI.   Allergies  Review of patient's allergies indicates no known allergies.  Home Medications   Prior to Admission medications   Medication Sig Start Date End Date Taking? Authorizing Provider  amiodarone (PACERONE) 200 MG tablet Take 200 mg by mouth daily. Take 5 days a week    Historical  Provider, MD  aspirin 81 MG tablet Take 81 mg by mouth daily.      Historical Provider, MD  carvedilol (COREG) 6.25 MG tablet TAKE 1 TABLET BY MOUTH TWICE DAILY WITH A MEAL 02/07/13   Marinus Maw, MD  docusate sodium (COLACE) 100 MG capsule Take 1 capsule (100 mg total) by mouth 2 (two) times daily. 04/30/13   Raeford Razor, MD  ergocalciferol (VITAMIN D2) 50000 UNITS capsule Take 50,000 Units by mouth once a week.      Historical Provider, MD  folic acid (FOLVITE) 1 MG tablet Take 1 mg by mouth 2 (two) times daily.      Historical Provider, MD  metFORMIN (GLUCOPHAGE) 500 MG tablet Take 500 mg by mouth daily with breakfast.      Historical Provider, MD  Multiple Vitamins-Minerals (EYE VITAMINS) CAPS Take by mouth 2 (two) times daily.      Historical Provider, MD  NAMENDA XR 28 MG CP24 TAKE ONE CAPSULE BY MOUTH EVERY DAY 02/07/13   Micki Riley, MD  oxyCODONE-acetaminophen (PERCOCET/ROXICET) 5-325 MG per tablet Take 1-2 tablets by mouth every 4 (four) hours as needed for severe pain. 04/30/13   Raeford Razor, MD  pravastatin (PRAVACHOL) 40 MG tablet Take 40 mg by mouth daily.      Historical Provider, MD  rivastigmine (EXELON) 4.6 mg/24hr Place 1 patch onto the skin daily.  Historical Provider, MD  sennosides-docusate sodium (SENOKOT-S) 8.6-50 MG tablet Take 1 tablet by mouth at bedtime.    Historical Provider, MD  triamterene-hydrochlorothiazide (MAXZIDE-25) 37.5-25 MG per tablet Take 0.5 tablets by mouth daily.      Historical Provider, MD  vitamin B-12 (CYANOCOBALAMIN) 1000 MCG tablet Take 1,000 mcg by mouth daily.      Historical Provider, MD   BP 129/66  Pulse 69  Temp(Src) 98.8 F (37.1 C) (Oral)  Resp 20  Ht 5' 8.5" (1.74 m)  Wt 290 lb (131.543 kg)  BMI 43.45 kg/m2  SpO2 96% Physical Exam  Nursing note and vitals reviewed. Constitutional: He appears well-developed and well-nourished. No distress.  HENT:  Head: Normocephalic and atraumatic.  Eyes: Conjunctivae are normal.  Right eye exhibits no discharge. Left eye exhibits no discharge.  Neck: Neck supple.  Cardiovascular: Normal rate, regular rhythm and normal heart sounds.  Exam reveals no gallop and no friction rub.   No murmur heard. Pulmonary/Chest: Effort normal and breath sounds normal. No respiratory distress. He exhibits tenderness.  Tenderness to palpation over the sternum and the right chest wall. No crepitus. Overlying skin changes noted. Lungs are clear bilaterally with good air movement. Chest rise appear symmetrical. No midline spinal tenderness.  Abdominal: Soft. He exhibits no distension. There is no tenderness.  Abdomen is normal to inspection. No tenderness. No ecchymosis or other concerning skin changes.  Musculoskeletal: He exhibits no edema and no tenderness.  Neurological: He is alert.  Skin: Skin is warm and dry.  Psychiatric: He has a normal mood and affect. His behavior is normal. Thought content normal.    ED Course  Procedures (including critical care time) Labs Review Labs Reviewed - No data to display  Imaging Review No results found.  Dg Ribs Unilateral W/chest Right  04/30/2013   CLINICAL DATA:  Right-sided rib pain  EXAM: RIGHT RIBS AND CHEST - 3+ VIEW  COMPARISON:  02/23/2009  FINDINGS: A pacing device is again seen. The cardiac shadow is stable. Postsurgical changes are again noted. The lungs are clear bilaterally. No acute rib fracture is identified.  IMPRESSION: No acute abnormality noted.   Electronically Signed   By: Alcide Clever M.D.   On: 04/30/2013 20:28    EKG Interpretation None      MDM   Final diagnoses:  Chest wall contusion    Pleasant 80 year old male with chest pain. Very consistent with a musculoskeletal etiology. Tenderness with palpation. History of fall. X-ray does not show any acute abnormality. Pain shoots symptomatically. Respiratory precautions were discussed. Outpatient followup as needed otherwise.   Raeford Razor, MD 05/06/13 1431

## 2013-05-09 ENCOUNTER — Other Ambulatory Visit: Payer: Self-pay | Admitting: Neurology

## 2013-06-24 ENCOUNTER — Ambulatory Visit (INDEPENDENT_AMBULATORY_CARE_PROVIDER_SITE_OTHER): Payer: Medicare Other | Admitting: *Deleted

## 2013-06-24 DIAGNOSIS — I472 Ventricular tachycardia, unspecified: Secondary | ICD-10-CM

## 2013-06-24 DIAGNOSIS — I495 Sick sinus syndrome: Secondary | ICD-10-CM

## 2013-06-24 DIAGNOSIS — I4729 Other ventricular tachycardia: Secondary | ICD-10-CM

## 2013-06-24 LAB — MDC_IDC_ENUM_SESS_TYPE_INCLINIC
Brady Statistic RV Percent Paced: 95 %
Lead Channel Impedance Value: 794 Ohm
Lead Channel Pacing Threshold Amplitude: 0.2 V — CL
Lead Channel Pacing Threshold Amplitude: 0.2 V — CL
Lead Channel Pacing Threshold Amplitude: 0.6 V
Lead Channel Pacing Threshold Amplitude: 0.6 V
Lead Channel Pacing Threshold Amplitude: 1 V
Lead Channel Pacing Threshold Amplitude: 1.2 V
Lead Channel Pacing Threshold Amplitude: 1.2 V
Lead Channel Pacing Threshold Amplitude: 1.2 V
Lead Channel Pacing Threshold Amplitude: 1.4 V
Lead Channel Pacing Threshold Pulse Width: 0.4 ms
Lead Channel Pacing Threshold Pulse Width: 0.4 ms
Lead Channel Pacing Threshold Pulse Width: 0.4 ms
Lead Channel Pacing Threshold Pulse Width: 0.5 ms
Lead Channel Pacing Threshold Pulse Width: 0.5 ms
Lead Channel Pacing Threshold Pulse Width: 0.5 ms
Lead Channel Pacing Threshold Pulse Width: 0.5 ms
Lead Channel Sensing Intrinsic Amplitude: 9.5 mV
Lead Channel Setting Pacing Amplitude: 2.4 V
Lead Channel Setting Pacing Pulse Width: 0.4 ms
Lead Channel Setting Sensing Sensitivity: 2.5 mV
MDC IDC MSMT LEADCHNL LV PACING THRESHOLD AMPLITUDE: 1.2 V
MDC IDC MSMT LEADCHNL LV PACING THRESHOLD AMPLITUDE: 1.2 V
MDC IDC MSMT LEADCHNL LV PACING THRESHOLD PULSEWIDTH: 0.5 ms
MDC IDC MSMT LEADCHNL LV PACING THRESHOLD PULSEWIDTH: 0.5 ms
MDC IDC MSMT LEADCHNL RA IMPEDANCE VALUE: 2500 Ohm — AB
MDC IDC MSMT LEADCHNL RA PACING THRESHOLD PULSEWIDTH: 0.5 ms
MDC IDC MSMT LEADCHNL RA PACING THRESHOLD PULSEWIDTH: 0.5 ms
MDC IDC MSMT LEADCHNL RV IMPEDANCE VALUE: 435 Ohm
MDC IDC MSMT LEADCHNL RV PACING THRESHOLD AMPLITUDE: 0.8 V
MDC IDC MSMT LEADCHNL RV PACING THRESHOLD PULSEWIDTH: 0.4 ms
MDC IDC MSMT LEADCHNL RV SENSING INTR AMPL: 9.5 mV — AB
MDC IDC PG SERIAL: 111352
MDC IDC SESS DTM: 20150616040000
MDC IDC SET LEADCHNL LV PACING AMPLITUDE: 2.4 V
MDC IDC SET LEADCHNL LV PACING PULSEWIDTH: 0.5 ms
MDC IDC SET LEADCHNL RV SENSING SENSITIVITY: 2.5 mV
MDC IDC STAT BRADY RA PERCENT PACED: 0 %

## 2013-06-24 NOTE — Progress Notes (Signed)
CRT-P device check in clinic. Normal device function. Thresholds, sensing, impedance consistent with previous measurements. Histograms appropriate for patient and level of activity. No ventricular high rate episodes. Patient bi-ventricularly pacing 94% of the time. Device programmed with appropriate safety margins. Estimated longevity 4.49yrs. ROV w/ Dr. Graciela Husbands 12/30/13.

## 2013-07-03 ENCOUNTER — Ambulatory Visit: Payer: Medicare Other | Admitting: Neurology

## 2013-07-15 ENCOUNTER — Encounter: Payer: Self-pay | Admitting: Internal Medicine

## 2013-08-09 ENCOUNTER — Other Ambulatory Visit: Payer: Self-pay | Admitting: Neurology

## 2013-08-24 ENCOUNTER — Other Ambulatory Visit: Payer: Self-pay | Admitting: Internal Medicine

## 2013-09-11 ENCOUNTER — Ambulatory Visit: Payer: Medicare Other | Admitting: Neurology

## 2013-09-20 ENCOUNTER — Emergency Department (HOSPITAL_COMMUNITY): Payer: Medicare Other

## 2013-09-20 ENCOUNTER — Encounter (HOSPITAL_COMMUNITY): Payer: Self-pay | Admitting: Emergency Medicine

## 2013-09-20 ENCOUNTER — Emergency Department (HOSPITAL_COMMUNITY)
Admission: EM | Admit: 2013-09-20 | Discharge: 2013-09-20 | Disposition: A | Discharge status: Home / Self Care | Payer: Medicare Other | Attending: Emergency Medicine | Admitting: Emergency Medicine

## 2013-09-20 DIAGNOSIS — Z79899 Other long term (current) drug therapy: Secondary | ICD-10-CM | POA: Diagnosis not present

## 2013-09-20 DIAGNOSIS — S0990XA Unspecified injury of head, initial encounter: Secondary | ICD-10-CM | POA: Insufficient documentation

## 2013-09-20 DIAGNOSIS — S022XXA Fracture of nasal bones, initial encounter for closed fracture: Secondary | ICD-10-CM | POA: Insufficient documentation

## 2013-09-20 DIAGNOSIS — Y9389 Activity, other specified: Secondary | ICD-10-CM | POA: Insufficient documentation

## 2013-09-20 DIAGNOSIS — Y92009 Unspecified place in unspecified non-institutional (private) residence as the place of occurrence of the external cause: Secondary | ICD-10-CM | POA: Diagnosis not present

## 2013-09-20 DIAGNOSIS — I472 Ventricular tachycardia, unspecified: Secondary | ICD-10-CM | POA: Insufficient documentation

## 2013-09-20 DIAGNOSIS — I4891 Unspecified atrial fibrillation: Secondary | ICD-10-CM | POA: Diagnosis not present

## 2013-09-20 DIAGNOSIS — E119 Type 2 diabetes mellitus without complications: Secondary | ICD-10-CM | POA: Diagnosis not present

## 2013-09-20 DIAGNOSIS — Z950 Presence of cardiac pacemaker: Secondary | ICD-10-CM | POA: Diagnosis not present

## 2013-09-20 DIAGNOSIS — S61409A Unspecified open wound of unspecified hand, initial encounter: Secondary | ICD-10-CM | POA: Diagnosis not present

## 2013-09-20 DIAGNOSIS — Z951 Presence of aortocoronary bypass graft: Secondary | ICD-10-CM | POA: Insufficient documentation

## 2013-09-20 DIAGNOSIS — E785 Hyperlipidemia, unspecified: Secondary | ICD-10-CM | POA: Insufficient documentation

## 2013-09-20 DIAGNOSIS — S01112A Laceration without foreign body of left eyelid and periocular area, initial encounter: Secondary | ICD-10-CM

## 2013-09-20 DIAGNOSIS — Z87448 Personal history of other diseases of urinary system: Secondary | ICD-10-CM | POA: Diagnosis not present

## 2013-09-20 DIAGNOSIS — H9190 Unspecified hearing loss, unspecified ear: Secondary | ICD-10-CM | POA: Diagnosis not present

## 2013-09-20 DIAGNOSIS — F0390 Unspecified dementia without behavioral disturbance: Secondary | ICD-10-CM | POA: Diagnosis not present

## 2013-09-20 DIAGNOSIS — S61412A Laceration without foreign body of left hand, initial encounter: Secondary | ICD-10-CM

## 2013-09-20 DIAGNOSIS — S0180XA Unspecified open wound of other part of head, initial encounter: Secondary | ICD-10-CM | POA: Insufficient documentation

## 2013-09-20 DIAGNOSIS — I2510 Atherosclerotic heart disease of native coronary artery without angina pectoris: Secondary | ICD-10-CM | POA: Insufficient documentation

## 2013-09-20 DIAGNOSIS — S0121XA Laceration without foreign body of nose, initial encounter: Secondary | ICD-10-CM

## 2013-09-20 DIAGNOSIS — I10 Essential (primary) hypertension: Secondary | ICD-10-CM | POA: Insufficient documentation

## 2013-09-20 DIAGNOSIS — R296 Repeated falls: Secondary | ICD-10-CM | POA: Insufficient documentation

## 2013-09-20 DIAGNOSIS — S0120XA Unspecified open wound of nose, initial encounter: Secondary | ICD-10-CM | POA: Insufficient documentation

## 2013-09-20 DIAGNOSIS — W19XXXA Unspecified fall, initial encounter: Secondary | ICD-10-CM

## 2013-09-20 DIAGNOSIS — I4729 Other ventricular tachycardia: Secondary | ICD-10-CM | POA: Diagnosis not present

## 2013-09-20 MED ORDER — LIDOCAINE-EPINEPHRINE 2 %-1:100000 IJ SOLN
20.0000 mL | Freq: Once | INTRAMUSCULAR | Status: DC
  Filled 2013-09-20: qty 1

## 2013-09-20 NOTE — ED Notes (Signed)
Patient presents with [facility] for unwitnessed fall. Wife found patient on floor. Patient has approximately 2" lac between eyes. Bleeding controlled and bandaged. Patient denies LOC, pain, lightheadedness, dizziness. Patient has Hx of dementia.

## 2013-09-20 NOTE — ED Notes (Signed)
Patient had a unwitnessed fall out of bed, but does not remember any of it.

## 2013-09-20 NOTE — ED Notes (Signed)
Family at bedside. 

## 2013-09-20 NOTE — ED Notes (Signed)
Bed: LT90 Expected date: 09/20/13 Expected time: 2:54 AM Means of arrival: Ambulance Comments: Fall, head injury

## 2013-09-20 NOTE — ED Notes (Signed)
Patient states he has all his belongings, when I asked him.

## 2013-09-20 NOTE — ED Notes (Signed)
Patient transported to CT 

## 2013-09-20 NOTE — Discharge Instructions (Signed)
Fall Prevention and Home Safety Falls cause injuries and can affect all age groups. It is possible to use preventive measures to significantly decrease the likelihood of falls. There are many simple measures which can make your home safer and prevent falls. OUTDOORS  Repair cracks and edges of walkways and driveways.  Remove high doorway thresholds.  Trim shrubbery on the main path into your home.  Have good outside lighting.  Clear walkways of tools, rocks, debris, and clutter.  Check that handrails are not broken and are securely fastened. Both sides of steps should have handrails.  Have leaves, snow, and ice cleared regularly.  Use sand or salt on walkways during winter months.  In the garage, clean up grease or oil spills. BATHROOM  Install night lights.  Install grab bars by the toilet and in the tub and shower.  Use non-skid mats or decals in the tub or shower.  Place a plastic non-slip stool in the shower to sit on, if needed.  Keep floors dry and clean up all water on the floor immediately.  Remove soap buildup in the tub or shower on a regular basis.  Secure bath mats with non-slip, double-sided rug tape.  Remove throw rugs and tripping hazards from the floors. BEDROOMS  Install night lights.  Make sure a bedside light is easy to reach.  Do not use oversized bedding.  Keep a telephone by your bedside.  Have a firm chair with side arms to use for getting dressed.  Remove throw rugs and tripping hazards from the floor. KITCHEN  Keep handles on pots and pans turned toward the center of the stove. Use back burners when possible.  Clean up spills quickly and allow time for drying.  Avoid walking on wet floors.  Avoid hot utensils and knives.  Position shelves so they are not too high or low.  Place commonly used objects within easy reach.  If necessary, use a sturdy step stool with a grab bar when reaching.  Keep electrical cables out of the  way.  Do not use floor polish or wax that makes floors slippery. If you must use wax, use non-skid floor wax.  Remove throw rugs and tripping hazards from the floor. STAIRWAYS  Never leave objects on stairs.  Place handrails on both sides of stairways and use them. Fix any loose handrails. Make sure handrails on both sides of the stairways are as long as the stairs.  Check carpeting to make sure it is firmly attached along stairs. Make repairs to worn or loose carpet promptly.  Avoid placing throw rugs at the top or bottom of stairways, or properly secure the rug with carpet tape to prevent slippage. Get rid of throw rugs, if possible.  Have an electrician put in a light switch at the top and bottom of the stairs. OTHER FALL PREVENTION TIPS  Wear low-heel or rubber-soled shoes that are supportive and fit well. Wear closed toe shoes.  When using a stepladder, make sure it is fully opened and both spreaders are firmly locked. Do not climb a closed stepladder.  Add color or contrast paint or tape to grab bars and handrails in your home. Place contrasting color strips on first and last steps.  Learn and use mobility aids as needed. Install an electrical emergency response system.  Turn on lights to avoid dark areas. Replace light bulbs that burn out immediately. Get light switches that glow.  Arrange furniture to create clear pathways. Keep furniture in the same place.  Firmly attach carpet with non-skid or double-sided tape. °· Eliminate uneven floor surfaces. °· Select a carpet pattern that does not visually hide the edge of steps. °· Be aware of all pets. °OTHER HOME SAFETY TIPS °· Set the water temperature for 120° F (48.8° C). °· Keep emergency numbers on or near the telephone. °· Keep smoke detectors on every level of the home and near sleeping areas. °Document Released: 12/16/2001 Document Revised: 06/27/2011 Document Reviewed: 03/17/2011 °[employer]® Patient Information ©2015  [employer]. This information is not intended to replace advice given to you by your health care provider. Make sure you discuss any questions you have with your health care provider. ° °Facial Laceration ° A facial laceration is a cut on the face. These injuries can be painful and cause bleeding. Lacerations usually heal quickly, but they need special care to reduce scarring. °DIAGNOSIS  °Your health care provider will take a medical history, ask for details about how the injury occurred, and examine the wound to determine how deep the cut is. °TREATMENT  °Some facial lacerations may not require closure. Others may not be able to be closed because of an increased risk of infection. The risk of infection and the chance for successful closure will depend on various factors, including the amount of time since the injury occurred. °The wound may be cleaned to help prevent infection. If closure is appropriate, pain medicines may be given if needed. Your health care provider will use stitches (sutures), wound glue (adhesive), or skin adhesive strips to repair the laceration. These tools bring the skin edges together to allow for faster healing and a better cosmetic outcome. If needed, you may also be given a tetanus shot. °HOME CARE INSTRUCTIONS °· Only take over-the-counter or prescription medicines as directed by your health care provider. °· Follow your health care provider's instructions for wound care. These instructions will vary depending on the technique used for closing the wound. °For Sutures: °· Keep the wound clean and dry.   °· If you were given a bandage (dressing), you should change it at least once a day. Also change the dressing if it becomes wet or dirty, or as directed by your health care provider.   °· Wash the wound with soap and water 2 times a day. Rinse the wound off with water to remove all soap. Pat the wound dry with a clean towel.   °· After cleaning, apply a thin layer of the antibiotic  ointment recommended by your health care provider. This will help prevent infection and keep the dressing from sticking.   °· You may shower as usual after the first 24 hours. Do not soak the wound in water until the sutures are removed.   °· Get your sutures removed as directed by your health care provider. With facial lacerations, sutures should usually be taken out after 4-5 days to avoid stitch marks.   °· Wait a few days after your sutures are removed before applying any makeup. °For Skin Adhesive Strips: °· Keep the wound clean and dry.   °· Do not get the skin adhesive strips wet. You may bathe carefully, using caution to keep the wound dry.   °· If the wound gets wet, pat it dry with a clean towel.   °· Skin adhesive strips will fall off on their own. You may trim the strips as the wound heals. Do not remove skin adhesive strips that are still stuck to the wound. They will fall off in time.   °For Wound Adhesive: °· You may briefly wet   your wound in the shower or bath. Do not soak or scrub the wound. Do not swim. Avoid periods of heavy sweating until the skin adhesive has fallen off on its own. After showering or bathing, gently pat the wound dry with a clean towel.   Do not apply liquid medicine, cream medicine, ointment medicine, or makeup to your wound while the skin adhesive is in place. This may loosen the film before your wound is healed.   If a dressing is placed over the wound, be careful not to apply tape directly over the skin adhesive. This may cause the adhesive to be pulled off before the wound is healed.   Avoid prolonged exposure to sunlight or tanning lamps while the skin adhesive is in place.  The skin adhesive will usually remain in place for 5-10 days, then naturally fall off the skin. Do not pick at the adhesive film.  After Healing: Once the wound has healed, cover the wound with sunscreen during the day for 1 full year. This can help minimize scarring. Exposure to  ultraviolet light in the first year will darken the scar. It can take 1-2 years for the scar to lose its redness and to heal completely.  SEEK IMMEDIATE MEDICAL CARE IF:  You have redness, pain, or swelling around the wound.   You see ayellowish-white fluid (pus) coming from the wound.   You have chills or a fever.  MAKE SURE YOU:  Understand these instructions.  Will watch your condition.  Will get help right away if you are not doing well or get worse. Document Released: 02/03/2004 Document Revised: 10/16/2012 Document Reviewed: 08/08/2012 [facility] Patient Information 2015 [place], [place]. This information is not intended to replace advice given to you by your health care provider. Make sure you discuss any questions you have with your health care provider.  Nasal Fracture A fracture is a break in the bone. A nasal fracture is a broken nose. Minor breaks do not need treatment. Serious breaks may need surgery.  HOME CARE  Put ice on the injured area.  Put ice in a plastic bag.  Place a towel between your skin and the bag.  Leave the ice on for 15-20 minutes, 03-04 times a day.  Only take medicine as told by your doctor.  If your nose bleeds, squeeze your nose shut gently. Sit upright for 10 minutes.  Do not play contact sports for 3 to 4 weeks or as told by your doctor. GET HELP RIGHT AWAY IF:   You have more pain or severe pain.  You keep having nosebleeds.  The shape of your nose does not return to normal after 5 days.  You have yellowish white fluid (pus) coming from your nose.  Your nose bleeds for over 20 minutes.  Clear fluid drains from your nose.  You have a grape-like puffiness (swelling) on the inside of your nose.  You have trouble moving your eyes.  You keep throwing up (vomiting). MAKE SURE YOU:   Understand these instructions.  Will watch this condition.  Will get help right away if you are not doing well or get worse. Document Released:  10/05/2007 Document Revised: 03/20/2011 Document Reviewed: 04/11/2010 [facility] Patient Information 2015 [place], [place]. This information is not intended to replace advice given to you by your health care provider. Make sure you discuss any questions you have with your health care provider.  Skin Tear Care A skin tear is a wound in which the top layer of skin  has peeled off. This is a common problem with aging because the skin becomes thinner and more fragile as a person gets older. In addition, some medicines, such as oral corticosteroids, can lead to skin thinning if taken for long periods of time.  A skin tear is often repaired with tape or skin adhesive strips. This keeps the skin that has been peeled off in contact with the healthier skin beneath. Depending on the location of the wound, a bandage (dressing) may be applied over the tape or skin adhesive strips. Sometimes, during the healing process, the skin turns black and dies. Even when this happens, the torn skin acts as a good dressing until the skin underneath gets healthier and repairs itself. HOME CARE INSTRUCTIONS   Change dressings once per day or as directed by your caregiver.  Gently clean the skin tear and the area around the tear using saline solution or mild soap and water.  Do not rub the injured skin dry. Let the area air dry.  Apply petroleum jelly or an antibiotic cream or ointment to keep the tear moist. This will help the wound heal. Do not allow a scab to form.  If the dressing sticks before the next dressing change, moisten it with warm soapy water and gently remove it.  Protect the injured skin until it has healed.  Only take over-the-counter or prescription medicines as directed by your caregiver.  Take showers or baths using warm soapy water. Apply a new dressing after the shower or bath.  Keep all follow-up appointments as directed by your caregiver.  SEEK IMMEDIATE MEDICAL CARE IF:   You have redness,  swelling, or increasing pain in the skin tear.  You havepus coming from the skin tear.  You have chills.  You have a red streak that goes away from the skin tear.  You have a bad smell coming from the tear or dressing.  You have a fever or persistent symptoms for more than 2-3 days.  You have a fever and your symptoms suddenly get worse. MAKE SURE YOU:  Understand these instructions.  Will watch this condition.  Will get help right away if your child is not doing well or gets worse. Document Released: 09/20/2000 Document Revised: 09/20/2011 Document Reviewed: 07/10/2011 [facility] Patient Information 2015 [place], [place]. This information is not intended to replace advice given to you by your health care provider. Make sure you discuss any questions you have with your health care provider.  Stitches, Staples, or Skin Adhesive Strips  Stitches (sutures), staples, and skin adhesive strips hold the skin together as it heals. They will usually be in place for 7 days or less. HOME CARE  Wash your hands with soap and water before and after you touch your wound.  Only take medicine as told by your doctor.  Cover your wound only if your doctor told you to. Otherwise, leave it open to air.  Do not get your stitches wet or dirty. If they get dirty, dab them gently with a clean washcloth. Wet the washcloth with soapy water. Do not rub. Pat them dry gently.  Do not put medicine or medicated cream on your stitches unless your doctor told you to.  Do not take out your own stitches or staples. Skin adhesive strips will fall off by themselves.  Do not pick at the wound. Picking can cause an infection.  Do not miss your follow-up appointment.  If you have problems or questions, call your doctor. GET HELP RIGHT AWAY  IF:   You have a temperature by mouth above 102 F (38.9 C), not controlled by medicine.  You have chills.  You have redness or pain around your stitches.  There is  puffiness (swelling) around your stitches.  You notice fluid (drainage) from your stitches.  There is a bad smell coming from your wound. MAKE SURE YOU:  Understand these instructions.  Will watch your condition.  Will get help if you are not doing well or get worse. Document Released: 10/23/2008 Document Revised: 03/20/2011 Document Reviewed: 10/23/2008 [facility] Patient Information 2015 [place], [place]. This information is not intended to replace advice given to you by your health care provider. Make sure you discuss any questions you have with your health care provider.

## 2013-09-20 NOTE — ED Provider Notes (Signed)
CSN: [id number]     Arrival date & time 09/20/13  0310 History   First MD Initiated Contact with Patient 09/20/13 0343     Chief Complaint  Patient presents with  . Fall  . Head Injury     (Consider location/radiation/quality/duration/timing/severity/associated sxs/prior Treatment) HPI 80 year old male presents to the emergency department from home after an unwitnessed fall.  Wife found patient on the floor.  He has skin tears to his left hand, small laceration on Bergevin nose, larger laceration over left eyebrow.  Patient without complaints.  He does not remember the event.  He has history of dementia.  He denies any chest pain shortness of breath headache lightheadedness or dizziness.  Family reports that they feel his tetanus is up-to-date. Past Medical History  Diagnosis Date  . Ventricular tachycardia     Syncope-2010 treated with amiodarone  . 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 Atrial fibrillation     Permanent  . HTN (hypertension)   . CAD (coronary artery disease)     s/p CABG; Failed LAD PCI 2010  . HLD (hyperlipidemia)   . BPH (benign prostatic hyperplasia)   . Decreased hearing   . Gilbert syndrome   . Carotid artery occlusion   . Dementia   . DM2 (diabetes mellitus, type 2)   . Memory loss    Past Surgical History  Procedure Laterality Date  . Coronary artery bypass graft  1998  . Hemorrhoid surgery    . Pacemaker insertion      [facility]   Family History  Problem Relation Age of Onset  . Coronary artery disease Mother   . Cancer Father    History  Substance Use Topics  . Smoking status: Never Smoker   . Smokeless tobacco: Never Used  . Alcohol Use: No    Review of Systems  Unable to perform ROS: Dementia      Allergies  Review of patient's allergies indicates no known allergies.  Home Medications   Prior to Admission medications   Medication Sig Start Date End Date Taking? Authorizing Provider  amiodarone (PACERONE)  200 MG tablet Take 200 mg by mouth daily. Take 5 days a week    Historical Provider, MD  aspirin 81 MG tablet Take 81 mg by mouth daily.      Historical Provider, MD  carvedilol (COREG) 6.25 MG tablet TAKE 1 TABLET BY MOUTH TWICE DAILY WITH MEALS 08/25/13   Marinus Maw, MD  docusate sodium (COLACE) 100 MG capsule Take 1 capsule (100 mg total) by mouth 2 (two) times daily. 04/30/13   Raeford Razor, MD  ergocalciferol (VITAMIN D2) 50000 UNITS capsule Take 50,000 Units by mouth once a week.      Historical Provider, MD  folic acid (FOLVITE) 1 MG tablet Take 1 mg by mouth 2 (two) times daily.      Historical Provider, MD  metFORMIN (GLUCOPHAGE) 500 MG tablet Take 500 mg by mouth daily with breakfast.      Historical Provider, MD  Multiple Vitamins-Minerals (EYE VITAMINS) CAPS Take by mouth 2 (two) times daily.      Historical Provider, MD  NAMENDA XR 28 MG CP24 TAKE ONE CAPSULE BY MOUTH EVERY DAY    Delia Heady, MD  oxyCODONE-acetaminophen (PERCOCET/ROXICET) 5-325 MG per tablet Take 1-2 tablets by mouth every 4 (four) hours as needed for severe pain. 04/30/13   Raeford Razor, MD  pravastatin (PRAVACHOL) 40 MG tablet Take 40 mg by mouth daily.  Historical Provider, MD  rivastigmine (EXELON) 4.6 mg/24hr Place 1 patch onto the skin daily.      Historical Provider, MD  sennosides-docusate sodium (SENOKOT-S) 8.6-50 MG tablet Take 1 tablet by mouth as needed.     Historical Provider, MD  triamterene-hydrochlorothiazide (MAXZIDE-25) 37.5-25 MG per tablet Take 0.5 tablets by mouth daily.      Historical Provider, MD  vitamin B-12 (CYANOCOBALAMIN) 1000 MCG tablet Take 1,000 mcg by mouth daily.      Historical Provider, MD   BP 158/68  Pulse 70  Temp(Src) 98 F (36.7 C) (Oral)  Resp 18  SpO2 95% Physical Exam  Nursing note and vitals reviewed. Constitutional: He is oriented to person, place, and time. He appears well-developed and well-nourished.  HENT:  Head: Normocephalic.  Right Ear: External  ear normal.  Left Ear: External ear normal.  Nose: Nose normal.  Mouth/Throat: Oropharynx is clear and moist.  0.25 cm laceration to bridge of nose.  2 cm laceration to left eyebrow.  Eyes: Conjunctivae and EOM are normal. Pupils are equal, round, and reactive to light.  Neck: Normal range of motion. Neck supple. No JVD present. No tracheal deviation present. No thyromegaly present.  Cardiovascular: Normal rate, regular rhythm, normal heart sounds and intact distal pulses.  Exam reveals no gallop and no friction rub.   No murmur heard. Pulmonary/Chest: Effort normal and breath sounds normal. No stridor. No respiratory distress. He has no wheezes. He has no rales. He exhibits no tenderness.  Abdominal: Soft. Bowel sounds are normal. He exhibits no distension and no mass. There is no tenderness. There is no rebound and no guarding.  Musculoskeletal: Normal range of motion. He exhibits no edema and no tenderness.  Scattered skin tears over the dorsum of the left hand  Lymphadenopathy:    He has no cervical adenopathy.  Neurological: He is alert and oriented to person, place, and time. He displays normal reflexes. He exhibits normal muscle tone. Coordination normal.  Skin: Skin is warm and dry. No rash noted. No erythema. No pallor.  Psychiatric: He has a normal mood and affect. His behavior is normal. Judgment and thought content normal.    ED Course  Procedures (including critical care time) Labs Review Labs Reviewed - No data to display  Imaging Review Ct Head Wo Contrast  09/20/2013   CLINICAL DATA:  Unwitnessed fall. Laceration between the eyes. Concern for cervical spine or maxillofacial injury.  EXAM: CT HEAD WITHOUT CONTRAST  CT MAXILLOFACIAL WITHOUT CONTRAST  CT CERVICAL SPINE WITHOUT CONTRAST  TECHNIQUE: Multidetector CT imaging of the head, cervical spine, and maxillofacial structures were performed using the standard protocol without intravenous contrast. Multiplanar CT image  reconstructions of the cervical spine and maxillofacial structures were also generated.  COMPARISON:  None.  FINDINGS: CT HEAD FINDINGS  There is no evidence of acute infarction, mass lesion, or intra- or extra-axial hemorrhage on CT.  Prominence of the ventricles and sulci reflects mild to moderate cortical volume loss. Chronic encephalomalacia is noted at the right occipital, parietal and frontal lobes, reflecting remote infarcts. Cerebellar atrophy is noted. Scattered periventricular and subcortical white matter change likely reflects small vessel ischemic microangiopathy.  The brainstem and fourth ventricle are within normal limits. The basal ganglia are unremarkable in appearance. The cerebral hemispheres demonstrate grossly normal gray-white differentiation. No mass effect or midline shift is seen.  There is no evidence of acute fracture. A left-sided craniotomy flap is grossly unremarkable in appearance. The orbits are within normal limits. The  paranasal sinuses and mastoid air cells are well-aerated. A soft tissue laceration is noted overlying the left frontal calvarium.  CT MAXILLOFACIAL FINDINGS  There is a minimally displaced fracture involving the left side of the nasal bone. No additional fractures are seen. The maxilla and mandible appear intact. The visualized dentition demonstrates no acute abnormality.  Cystic change within the dens and body of C2 may reflect chronic degenerative change.  The orbits are intact bilaterally. The visualized paranasal sinuses and mastoid air cells are well-aerated.  A soft tissue laceration is noted overlying the left frontal calvarium, with associated soft tissue swelling. The parapharyngeal fat planes are preserved. The nasopharynx, oropharynx and hypopharynx are unremarkable in appearance. The visualized portions of the valleculae and piriform sinuses are grossly unremarkable.  Scattered calcifications are noted at the carotid bifurcations bilaterally.  The parotid  and submandibular glands are within normal limits. No cervical lymphadenopathy is seen.  CT CERVICAL SPINE FINDINGS  There is no evidence of acute fracture or subluxation. There is grade 1 anterolisthesis of C4 on C5, with multilevel disc space narrowing and scattered anterior and posterior disc osteophyte complexes at the lower cervical spine. Underlying facet disease is noted. Prevertebral soft tissues are within normal limits.  A 2.0 cm hypodensity is noted within the right thyroid lobe. A smaller vague hypodensity is noted within the superior aspect of the left thyroid lobe. The visualized lung apices are clear. No significant soft tissue abnormalities are seen.  IMPRESSION: 1. No evidence of traumatic intracranial injury. 2. No evidence of acute fracture or subluxation along the cervical spine. 3. Minimally displaced fracture involving the left side of the nasal bone. 4. Soft tissue laceration overlying the left frontal calvarium, with associated soft tissue swelling. 5. Mild to moderate cortical volume loss. Chronic encephalomalacia at the right occipital, parietal and frontal lobes reflects remote infarcts. 6. Scattered small vessel ischemic microangiopathy. 7. Cystic change within the dens and body of C2 may reflect chronic degenerative change. 8. Grade 1 anterolisthesis of C4 on C5, with underlying degenerative change. 9. 2.0 cm hypodensity within the right thyroid lobe. Consider further evaluation with thyroid ultrasound. If patient is clinically hyperthyroid, consider nuclear medicine thyroid uptake and scan.   Electronically Signed   By: Roanna Raider M.D.   On: 09/20/2013 05:10   Ct Cervical Spine Wo Contrast  09/20/2013   CLINICAL DATA:  Unwitnessed fall. Laceration between the eyes. Concern for cervical spine or maxillofacial injury.  EXAM: CT HEAD WITHOUT CONTRAST  CT MAXILLOFACIAL WITHOUT CONTRAST  CT CERVICAL SPINE WITHOUT CONTRAST  TECHNIQUE: Multidetector CT imaging of the head, cervical  spine, and maxillofacial structures were performed using the standard protocol without intravenous contrast. Multiplanar CT image reconstructions of the cervical spine and maxillofacial structures were also generated.  COMPARISON:  None.  FINDINGS: CT HEAD FINDINGS  There is no evidence of acute infarction, mass lesion, or intra- or extra-axial hemorrhage on CT.  Prominence of the ventricles and sulci reflects mild to moderate cortical volume loss. Chronic encephalomalacia is noted at the right occipital, parietal and frontal lobes, reflecting remote infarcts. Cerebellar atrophy is noted. Scattered periventricular and subcortical white matter change likely reflects small vessel ischemic microangiopathy.  The brainstem and fourth ventricle are within normal limits. The basal ganglia are unremarkable in appearance. The cerebral hemispheres demonstrate grossly normal gray-white differentiation. No mass effect or midline shift is seen.  There is no evidence of acute fracture. A left-sided craniotomy flap is grossly unremarkable in appearance. The orbits are  within normal limits. The paranasal sinuses and mastoid air cells are well-aerated. A soft tissue laceration is noted overlying the left frontal calvarium.  CT MAXILLOFACIAL FINDINGS  There is a minimally displaced fracture involving the left side of the nasal bone. No additional fractures are seen. The maxilla and mandible appear intact. The visualized dentition demonstrates no acute abnormality.  Cystic change within the dens and body of C2 may reflect chronic degenerative change.  The orbits are intact bilaterally. The visualized paranasal sinuses and mastoid air cells are well-aerated.  A soft tissue laceration is noted overlying the left frontal calvarium, with associated soft tissue swelling. The parapharyngeal fat planes are preserved. The nasopharynx, oropharynx and hypopharynx are unremarkable in appearance. The visualized portions of the valleculae and  piriform sinuses are grossly unremarkable.  Scattered calcifications are noted at the carotid bifurcations bilaterally.  The parotid and submandibular glands are within normal limits. No cervical lymphadenopathy is seen.  CT CERVICAL SPINE FINDINGS  There is no evidence of acute fracture or subluxation. There is grade 1 anterolisthesis of C4 on C5, with multilevel disc space narrowing and scattered anterior and posterior disc osteophyte complexes at the lower cervical spine. Underlying facet disease is noted. Prevertebral soft tissues are within normal limits.  A 2.0 cm hypodensity is noted within the right thyroid lobe. A smaller vague hypodensity is noted within the superior aspect of the left thyroid lobe. The visualized lung apices are clear. No significant soft tissue abnormalities are seen.  IMPRESSION: 1. No evidence of traumatic intracranial injury. 2. No evidence of acute fracture or subluxation along the cervical spine. 3. Minimally displaced fracture involving the left side of the nasal bone. 4. Soft tissue laceration overlying the left frontal calvarium, with associated soft tissue swelling. 5. Mild to moderate cortical volume loss. Chronic encephalomalacia at the right occipital, parietal and frontal lobes reflects remote infarcts. 6. Scattered small vessel ischemic microangiopathy. 7. Cystic change within the dens and body of C2 may reflect chronic degenerative change. 8. Grade 1 anterolisthesis of C4 on C5, with underlying degenerative change. 9. 2.0 cm hypodensity within the right thyroid lobe. Consider further evaluation with thyroid ultrasound. If patient is clinically hyperthyroid, consider nuclear medicine thyroid uptake and scan.   Electronically Signed   By: Roanna Raider M.D.   On: 09/20/2013 05:10   Ct Maxillofacial Wo Cm  09/20/2013   CLINICAL DATA:  Unwitnessed fall. Laceration between the eyes. Concern for cervical spine or maxillofacial injury.  EXAM: CT HEAD WITHOUT CONTRAST  CT  MAXILLOFACIAL WITHOUT CONTRAST  CT CERVICAL SPINE WITHOUT CONTRAST  TECHNIQUE: Multidetector CT imaging of the head, cervical spine, and maxillofacial structures were performed using the standard protocol without intravenous contrast. Multiplanar CT image reconstructions of the cervical spine and maxillofacial structures were also generated.  COMPARISON:  None.  FINDINGS: CT HEAD FINDINGS  There is no evidence of acute infarction, mass lesion, or intra- or extra-axial hemorrhage on CT.  Prominence of the ventricles and sulci reflects mild to moderate cortical volume loss. Chronic encephalomalacia is noted at the right occipital, parietal and frontal lobes, reflecting remote infarcts. Cerebellar atrophy is noted. Scattered periventricular and subcortical white matter change likely reflects small vessel ischemic microangiopathy.  The brainstem and fourth ventricle are within normal limits. The basal ganglia are unremarkable in appearance. The cerebral hemispheres demonstrate grossly normal gray-white differentiation. No mass effect or midline shift is seen.  There is no evidence of acute fracture. A left-sided craniotomy flap is grossly unremarkable in appearance.  The orbits are within normal limits. The paranasal sinuses and mastoid air cells are well-aerated. A soft tissue laceration is noted overlying the left frontal calvarium.  CT MAXILLOFACIAL FINDINGS  There is a minimally displaced fracture involving the left side of the nasal bone. No additional fractures are seen. The maxilla and mandible appear intact. The visualized dentition demonstrates no acute abnormality.  Cystic change within the dens and body of C2 may reflect chronic degenerative change.  The orbits are intact bilaterally. The visualized paranasal sinuses and mastoid air cells are well-aerated.  A soft tissue laceration is noted overlying the left frontal calvarium, with associated soft tissue swelling. The parapharyngeal fat planes are preserved.  The nasopharynx, oropharynx and hypopharynx are unremarkable in appearance. The visualized portions of the valleculae and piriform sinuses are grossly unremarkable.  Scattered calcifications are noted at the carotid bifurcations bilaterally.  The parotid and submandibular glands are within normal limits. No cervical lymphadenopathy is seen.  CT CERVICAL SPINE FINDINGS  There is no evidence of acute fracture or subluxation. There is grade 1 anterolisthesis of C4 on C5, with multilevel disc space narrowing and scattered anterior and posterior disc osteophyte complexes at the lower cervical spine. Underlying facet disease is noted. Prevertebral soft tissues are within normal limits.  A 2.0 cm hypodensity is noted within the right thyroid lobe. A smaller vague hypodensity is noted within the superior aspect of the left thyroid lobe. The visualized lung apices are clear. No significant soft tissue abnormalities are seen.  IMPRESSION: 1. No evidence of traumatic intracranial injury. 2. No evidence of acute fracture or subluxation along the cervical spine. 3. Minimally displaced fracture involving the left side of the nasal bone. 4. Soft tissue laceration overlying the left frontal calvarium, with associated soft tissue swelling. 5. Mild to moderate cortical volume loss. Chronic encephalomalacia at the right occipital, parietal and frontal lobes reflects remote infarcts. 6. Scattered small vessel ischemic microangiopathy. 7. Cystic change within the dens and body of C2 may reflect chronic degenerative change. 8. Grade 1 anterolisthesis of C4 on C5, with underlying degenerative change. 9. 2.0 cm hypodensity within the right thyroid lobe. Consider further evaluation with thyroid ultrasound. If patient is clinically hyperthyroid, consider nuclear medicine thyroid uptake and scan.   Electronically Signed   By: Roanna Raider M.D.   On: 09/20/2013 05:10     EKG Interpretation None     LACERATION REPAIR Performed by:  Olivia Mackie Authorized by: Olivia Mackie Consent: Verbal consent obtained. Risks and benefits: risks, benefits and alternatives were discussed Consent given by: patient Patient identity confirmed: provided demographic data Prepped and Draped in normal sterile fashion Wound explored  Laceration Location: left eyebrow  Laceration Length: 2 cm  No Foreign Bodies seen or palpated  Anesthesia: local infiltration  Local anesthetic: lidocaine 2% with epinephrine  Anesthetic total: 4 ml  Irrigation method: syringe Amount of cleaning: standard  Skin closure: 5.0 prolene  Number of sutures: 7   Technique: simple interrupted. Multiple flaps requiring undermining and approximation for repair  Patient tolerance: Patient tolerated the procedure well with no immediate complications.  Laceration addition:  dermabond applied to bridge of nose over 0.25 cm laceration.  MDM   Final diagnoses:  Fall at home, initial encounter  Complex laceration of left eyebrow, initial encounter  Nasal laceration, initial encounter  Nasal bone fracture, closed, initial encounter  Skin tear of hand without complication, left, initial encounter    80 year old male status post unwitnessed fall.  Plan for head  face and neck CT scans.  Plan for repair of laceration.  Olivia Mackie, MD 09/20/13 [phone]

## 2013-11-13 ENCOUNTER — Other Ambulatory Visit: Payer: Self-pay | Admitting: Neurology

## 2013-12-15 ENCOUNTER — Other Ambulatory Visit: Payer: Self-pay | Admitting: Neurology

## 2013-12-30 ENCOUNTER — Other Ambulatory Visit: Payer: Self-pay | Admitting: Internal Medicine

## 2013-12-30 ENCOUNTER — Encounter: Payer: Medicare Other | Admitting: Internal Medicine

## 2013-12-31 ENCOUNTER — Other Ambulatory Visit: Payer: Self-pay | Admitting: *Deleted

## 2013-12-31 ENCOUNTER — Other Ambulatory Visit: Payer: Self-pay | Admitting: Neurology

## 2013-12-31 DIAGNOSIS — I70245 Atherosclerosis of native arteries of left leg with ulceration of other part of foot: Secondary | ICD-10-CM

## 2013-12-31 NOTE — Telephone Encounter (Signed)
I called the patient, but his caregiver was not available.

## 2014-01-20 ENCOUNTER — Encounter: Payer: Self-pay | Admitting: Vascular Surgery

## 2014-01-21 ENCOUNTER — Ambulatory Visit (HOSPITAL_COMMUNITY)
Admission: RE | Admit: 2014-01-21 | Discharge: 2014-01-21 | Disposition: A | Discharge status: Home / Self Care | Payer: Medicare Other | Source: Ambulatory Visit | Attending: Vascular Surgery | Admitting: Vascular Surgery

## 2014-01-21 ENCOUNTER — Ambulatory Visit (INDEPENDENT_AMBULATORY_CARE_PROVIDER_SITE_OTHER): Payer: 59 | Admitting: Vascular Surgery

## 2014-01-21 ENCOUNTER — Encounter: Payer: Self-pay | Admitting: Vascular Surgery

## 2014-01-21 VITALS — BP 128/74 | HR 70 | Ht 68.5 in | Wt 236.0 lb

## 2014-01-21 DIAGNOSIS — I70245 Atherosclerosis of native arteries of left leg with ulceration of other part of foot: Secondary | ICD-10-CM | POA: Diagnosis not present

## 2014-01-21 DIAGNOSIS — I70299 Other atherosclerosis of native arteries of extremities, unspecified extremity: Secondary | ICD-10-CM

## 2014-01-21 DIAGNOSIS — L97909 Non-pressure chronic ulcer of unspecified part of unspecified lower leg with unspecified severity: Secondary | ICD-10-CM

## 2014-01-21 MED ORDER — CEPHALEXIN 500 MG PO CAPS: 500.0000 mg | ORAL_CAPSULE | Freq: Three times a day (TID) | ORAL | Status: DC

## 2014-01-21 NOTE — Assessment & Plan Note (Signed)
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 The wound is fairly small and measures 7 mm in diameter. There is some surrounding cellulitis and I have started him on Keflex 500 mg by mouth 3 times a day. I'll plan on seeing him back in 3-4 weeks. If the wound progresses then this is a difficult situation given that he has evidence of tibial artery occlusive disease and a toe amputation may not have adequate circulation to heal. Likewise, he is not a good candidate for bypass given that he is 58 with a history of Alzheimer's dementia. I'll see him back in 3-4 weeks. His wife knows to call sooner if the wound progresses.

## 2014-01-21 NOTE — Progress Notes (Signed)
Patient ID: John [name], male   DOB: 1922/07/15, 81 y.o.   MRN: [id number]  Reason for Consult: New Evaluation   Referred by Ezequiel Kayser, MD  Subjective:     HPI:  John [name] is a 81 y.o. male who is referred with a wound on his left second toe and evidence of peripheral vascular disease. He has a history of Alzheimer's dementia and therefore is not a great historian. The best I can tell he has had this wound for several months on the left second toe. He does not remember any specific injury to the toe. It has been fairly stable. He denies any history of fever or chills. He did have a Doppler study done which showed evidence of peripheral vascular disease and therefore he was referred for vascular evaluation.  I do not get any history of claudication although his activity is very limited. I do not get any history of rest pain.  I have reviewed the records from Dr. Laurey Morale office. The patient does have a history of Alzheimer's dementia. The patient had a wound on the dorsum of the left second toe and given his peripheral vascular disease was sent for vascular evaluation. He does have a history of a pacemaker and history of ventricular tachycardia. In addition he has undergone previous coronary revascularization  Past Medical History  Diagnosis Date  . Ventricular tachycardia     Syncope-2010 treated with amiodarone  . 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 Atrial fibrillation     Permanent  . HTN (hypertension)   . CAD (coronary artery disease)     s/p CABG; Failed LAD PCI 2010  . HLD (hyperlipidemia)   . BPH (benign prostatic hyperplasia)   . Decreased hearing   . Gilbert syndrome   . Carotid artery occlusion   . Dementia   . DM2 (diabetes mellitus, type 2)   . Memory loss   . CHF (congestive heart failure)   . Stroke    Family History  Problem Relation Age of Onset  . Coronary artery disease Mother   . Cancer Father    Past Surgical History    Procedure Laterality Date  . Coronary artery bypass graft  1998  . Hemorrhoid surgery    . Pacemaker insertion      [employer]    Short Social History:  History  Substance Use Topics  . Smoking status: Never Smoker   . Smokeless tobacco: Never Used  . Alcohol Use: No    No Known Allergies  Current Outpatient Prescriptions  Medication Sig Dispense Refill  . amiodarone (PACERONE) 200 MG tablet Take 200 mg by mouth daily. Sunday,Monday,Wednesday,Thursday, & Friday only    . aspirin 81 MG tablet Take 81 mg by mouth daily.      . carvedilol (COREG) 6.25 MG tablet TAKE 1 TABLET BY MOUTH TWICE DAILY WITH MEALS 60 tablet 1  . docusate sodium (COLACE) 100 MG capsule Take 100 mg by mouth daily as needed for mild constipation.    . ergocalciferol (VITAMIN D2) 50000 UNITS capsule Take 50,000 Units by mouth once a week. Sunday    . folic acid (FOLVITE) 1 MG tablet Take 1 mg by mouth 2 (two) times daily.      . metFORMIN (GLUCOPHAGE) 500 MG tablet Take 500 mg by mouth daily with breakfast.      . Multiple Vitamins-Minerals (EYE VITAMINS) CAPS Take by mouth 2 (two) times daily.      Marland Kitchen  NAMENDA XR 28 MG CP24 TAKE ONE CAPSULE BY MOUTH DAILY( PLEASE RESCHEDULE APPT) 7 capsule 0  . pravastatin (PRAVACHOL) 40 MG tablet Take 40 mg by mouth daily.      . rivastigmine (EXELON) 4.6 mg/24hr Place 1 patch onto the skin daily.      Marland Kitchen triamterene-hydrochlorothiazide (MAXZIDE-25) 37.5-25 MG per tablet Take 0.5 tablets by mouth daily.      . vitamin B-12 (CYANOCOBALAMIN) 1000 MCG tablet Take 1,000 mcg by mouth daily.       No current facility-administered medications for this visit.    Review of Systems  Constitutional: Negative for chills and fever.  Eyes: Negative for loss of vision.  Respiratory: Negative for cough and wheezing.  Cardiovascular: Positive for claudication and leg swelling. Negative for chest pain, chest tightness, dyspnea with exertion, orthopnea and palpitations.  GI: Negative  for blood in stool and vomiting.  GU: Negative for dysuria and hematuria.  Musculoskeletal: Negative for leg pain, joint pain and myalgias.  Skin: Positive for wound. Negative for rash.  Neurological: Negative for dizziness and speech difficulty.  Hematologic: Negative for bruises/bleeds easily. Psychiatric: Negative for depressed mood.        Objective:  Objective  Filed Vitals:   01/21/14 1457  BP: 128/74  Pulse: 70  Height: 5' 8.5" (1.74 m)  Weight: 236 lb (107.049 kg)  SpO2: 100%   Body mass index is 35.36 kg/(m^2).  Physical Exam  Constitutional: He is oriented to person, place, and time. He appears well-developed and well-nourished.  HENT:  Head: Normocephalic and atraumatic.  Neck: Neck supple. No JVD present. No thyromegaly present.  Cardiovascular: Normal rate, regular rhythm and normal heart sounds.  Exam reveals no friction rub.   No murmur heard. Pulses:      Femoral pulses are 2+ on the right side, and 2+ on the left side.      Popliteal pulses are 0 on the right side, and 0 on the left side.       Dorsalis pedis pulses are 0 on the right side, and 0 on the left side.       Posterior tibial pulses are 0 on the right side, and 0 on the left side.  I do not detect carotid bruits. He has mild bilateral lower extremity swelling. He has some hyperpigmentation consistent with chronic venous insufficiency.  Pulmonary/Chest: Breath sounds normal. He has no wheezes. He has no rales.  Abdominal: Soft. Bowel sounds are normal. There is no tenderness.  Musculoskeletal: Normal range of motion. He exhibits no edema.  Lymphadenopathy:    He has no cervical adenopathy.  Neurological: He is alert and oriented to person, place, and time. He has normal strength. No sensory deficit.  Skin: No lesion and no rash noted.  Psychiatric: He has a normal mood and affect.    Data: I have reviewed the ultrasound of the lower extremities which was performed on 12/25/2013. This showed an  ABI on the right and 75%. ABI on the left was 78%. This study suggested tibial artery occlusive disease.  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 The common femoral, superficial femoral and popliteal arteries appear patent.      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 The wound is fairly small and measures 7 mm in diameter. There  is some surrounding cellulitis and I have started him on Keflex 500 mg by mouth 3 times a day. I'll plan on seeing him back in 3-4 weeks. If the wound progresses then this is a difficult situation given that he has evidence of tibial artery occlusive disease and a toe amputation may not have adequate circulation to heal. Likewise, he is not a good candidate for bypass given that he is 64 with a history of Alzheimer's dementia. I'll see him back in 3-4 weeks. His wife knows to call sooner if the wound progresses.     Chuck Hint MD [facility]

## 2014-01-21 NOTE — Patient Instructions (Signed)
Dr. Edilia Bo has prescribed Keflex 500mg  for you to take, 1 capsule two times daily. This is an antibiotic to help your toe heal. If you have any questions please call me:    , RN [facility]   [phone] ext [phone]   Peripheral Vascular Disease  Peripheral vascular disease (PVD) is caused by cholesterol buildup in the arteries. The arteries become narrow or clogged. This makes it hard for blood to flow. It happens most in the legs, but it can occur in other areas of your body. HOME CARE   Quit smoking, if you smoke.  Exercise as told by your doctor.  Follow a low-fat, low-cholesterol diet as told by your doctor.  Control your diabetes, if you have diabetes.  Care for your feet to prevent infection.  Only take medicine as told by your doctor. GET HELP RIGHT AWAY IF:   You have pain or lose feeling (numbness) in your arms or legs.  Your arms or legs turn cold or blue.  You have redness, warmth, and puffiness (swelling) in your arms or legs. MAKE SURE YOU:   Understand these instructions.  Will watch your condition.  Will get help right away if you are not doing well or get worse. Document Released: 03/22/2009 Document Revised: 03/20/2011 Document Reviewed: 03/22/2009 [facility] Patient Information 2015 [place], [place]. This information is not intended to replace advice given to you by your health care provider. Make sure you discuss any questions you have with your health care provider.

## 2014-01-23 ENCOUNTER — Other Ambulatory Visit: Payer: Self-pay

## 2014-01-23 DIAGNOSIS — I739 Peripheral vascular disease, unspecified: Secondary | ICD-10-CM

## 2014-01-23 DIAGNOSIS — L03032 Cellulitis of left toe: Secondary | ICD-10-CM

## 2014-01-23 MED ORDER — CEPHALEXIN 500 MG PO CAPS: 500.0000 mg | ORAL_CAPSULE | Freq: Three times a day (TID) | ORAL | Status: DC

## 2014-02-10 ENCOUNTER — Ambulatory Visit (INDEPENDENT_AMBULATORY_CARE_PROVIDER_SITE_OTHER): Payer: 59 | Admitting: Internal Medicine

## 2014-02-10 ENCOUNTER — Encounter: Payer: Self-pay | Admitting: Internal Medicine

## 2014-02-10 VITALS — BP 102/58 | HR 73 | Ht 70.0 in | Wt 238.8 lb

## 2014-02-10 DIAGNOSIS — I255 Ischemic cardiomyopathy: Secondary | ICD-10-CM

## 2014-02-10 DIAGNOSIS — I482 Chronic atrial fibrillation, unspecified: Secondary | ICD-10-CM

## 2014-02-10 DIAGNOSIS — I495 Sick sinus syndrome: Secondary | ICD-10-CM

## 2014-02-10 DIAGNOSIS — I472 Ventricular tachycardia: Secondary | ICD-10-CM

## 2014-02-10 DIAGNOSIS — Z45018 Encounter for adjustment and management of other part of cardiac pacemaker: Secondary | ICD-10-CM

## 2014-02-10 DIAGNOSIS — I4729 Other ventricular tachycardia: Secondary | ICD-10-CM

## 2014-02-10 LAB — MDC_IDC_ENUM_SESS_TYPE_INCLINIC
Implantable Pulse Generator Serial Number: 111352
Lead Channel Impedance Value: 2500 Ohm
Lead Channel Impedance Value: 794 Ohm
Lead Channel Pacing Threshold Amplitude: 0.2 V — CL
Lead Channel Pacing Threshold Amplitude: 0.6 V
Lead Channel Pacing Threshold Amplitude: 1 V
Lead Channel Pacing Threshold Amplitude: 1.2 V
Lead Channel Pacing Threshold Amplitude: 1.4 V
Lead Channel Pacing Threshold Pulse Width: 0.5 ms
Lead Channel Pacing Threshold Pulse Width: 0.5 ms
Lead Channel Pacing Threshold Pulse Width: 0.5 ms
Lead Channel Pacing Threshold Pulse Width: 0.8 ms
Lead Channel Sensing Intrinsic Amplitude: 9.5 mV
Lead Channel Setting Pacing Amplitude: 2.4 V
Lead Channel Setting Pacing Pulse Width: 0.8 ms
Lead Channel Setting Sensing Sensitivity: 2.5 mV
MDC IDC MSMT LEADCHNL RA PACING THRESHOLD AMPLITUDE: 0.2 V — AB
MDC IDC MSMT LEADCHNL RA PACING THRESHOLD AMPLITUDE: 1.4 V
MDC IDC MSMT LEADCHNL RA PACING THRESHOLD PULSEWIDTH: 0.5 ms
MDC IDC MSMT LEADCHNL RA PACING THRESHOLD PULSEWIDTH: 0.5 ms
MDC IDC MSMT LEADCHNL RV IMPEDANCE VALUE: 444 Ohm
MDC IDC MSMT LEADCHNL RV PACING THRESHOLD PULSEWIDTH: 0.4 ms
MDC IDC MSMT LEADCHNL RV SENSING INTR AMPL: 9.5 mV — AB
MDC IDC SESS DTM: 20160202050000
MDC IDC SET LEADCHNL LV PACING AMPLITUDE: 2.4 V
MDC IDC SET LEADCHNL LV PACING PULSEWIDTH: 0.5 ms
MDC IDC SET LEADCHNL RV SENSING SENSITIVITY: 2.5 mV
MDC IDC STAT BRADY RA PERCENT PACED: 0 %
MDC IDC STAT BRADY RV PERCENT PACED: 93 %

## 2014-02-10 NOTE — Patient Instructions (Signed)
Your physician recommends that you continue on your current medications as directed. Please refer to the Current Medication list given to you today.  Remote monitoring is used to monitor your Pacemaker of ICD from home. This monitoring reduces the number of office visits required to check your device to one time per year. It allows us to keep an eye on the functioning of your device to ensure it is working properly. You are scheduled for a device check from home on 05/12/14. You may send your transmission at any time that day. If you have a wireless device, the transmission will be sent automatically. After your physician reviews your transmission, you will receive a postcard with your next transmission date.  Your physician wants you to follow-up in: 1 year with Dr. Klein.  You will receive a reminder letter in the mail two months in advance. If you don't receive a letter, please call our office to schedule the follow-up appointment.  

## 2014-02-10 NOTE — Progress Notes (Signed)
Electrophysiology Office Note   Date:  02/10/2014   ID:  John [name], DOB April 01, 1922, MRN [id number]  PCP:  Ezequiel Kayser, MD  Cardiologist: PZW Primary Electrophysiologist:    Sherryl Manges, MD    Chief Complaint  Patient presents with  . Appointment    ROV     History of Present Illness: John [name] is a 81 y.o. male is seen today in followup for  ventricular tachycardia for which she is treated with amiodarone. His history of bradycardia in the context of permanent atrial fibrillation.  He is not on anticoagulation because of "spontaneous bilateral subdural hematomata" which I gather from the note dated 2010 by neurology. Original notes are not available.  He has coronary artery disease with prior bypass surgery. Last underwent catheterization 2012 at which time LAD PCI was attempted   He has stable chronic symptoms and modest edema     Today, he denies symptoms of palpitations, chest pain,  claudication, dizziness, presyncope, syncope, bleeding, or neurologic sequela. The patient is tolerating medications without difficulties and is otherwise without complaint today.    Past Medical History  Diagnosis Date  . Ventricular tachycardia     Syncope-2010 treated with amiodarone  . 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 Atrial fibrillation     Permanent  . HTN (hypertension)   . CAD (coronary artery disease)     s/p CABG; Failed LAD PCI 2010  . HLD (hyperlipidemia)   . BPH (benign prostatic hyperplasia)   . Decreased hearing   . Gilbert syndrome   . Carotid artery occlusion   . Dementia   . DM2 (diabetes mellitus, type 2)   . Memory loss   . CHF (congestive heart failure)   . Stroke    Past Surgical History  Procedure Laterality Date  . Coronary artery bypass graft  1998  . Hemorrhoid surgery    . Pacemaker insertion      [employer]     Current Outpatient Prescriptions  Medication Sig Dispense Refill  . amiodarone (PACERONE) 200 MG  tablet Take 200 mg by mouth daily. Sunday,Monday,Wednesday,Thursday, & Friday only    . aspirin 81 MG tablet Take 81 mg by mouth daily.      . carvedilol (COREG) 6.25 MG tablet TAKE 1 TABLET BY MOUTH TWICE DAILY WITH MEALS 60 tablet 1  . cephALEXin (KEFLEX) 500 MG capsule Take 1 capsule (500 mg total) by mouth 3 (three) times daily. 42 capsule 1  . Cholecalciferol (VITAMIN D3) 1000 UNITS CAPS Take 1 capsule by mouth daily.    . ergocalciferol (VITAMIN D2) 50000 UNITS capsule Take 50,000 Units by mouth once a week. Sunday    . metFORMIN (GLUCOPHAGE) 500 MG tablet Take 500 mg by mouth daily with breakfast.      . Multiple Vitamins-Minerals (EYE VITAMINS) CAPS Take by mouth 2 (two) times daily.      . pravastatin (PRAVACHOL) 40 MG tablet Take 40 mg by mouth daily.      . rivastigmine (EXELON) 4.6 mg/24hr Place 1 patch onto the skin daily.      Marland Kitchen triamterene-hydrochlorothiazide (MAXZIDE-25) 37.5-25 MG per tablet Take 0.5 tablets by mouth daily.       No current facility-administered medications for this visit.    Allergies:   Review of patient's allergies indicates no known allergies.   Social History:  The patient  reports that he has never smoked. He has never used smokeless tobacco. He reports that he does  not drink alcohol or use illicit drugs.   Family History:  The patient's    family history includes Cancer in his father; Coronary artery disease in his mother.    ROS:  Please see the history of present illness and past medical history  Otherwise, review of systems is positive for diabetic ulceer.     PHYSICAL EXAM: VS:  BP 102/58 mmHg  Pulse 73  Ht 5\' 10"  (1.778 m)  Wt 238 lb 12.8 oz (108.319 kg)  BMI 34.26 kg/m2 , BMI Body mass index is 34.26 kg/(m^2). GEN: Well nourished, Morbidly obese White male, in no acute distress HEENT: normal Neck:  JVD flat, carotid bruits, or masses Cardiac: REGULAR RATE and RHYTHM ; 2/6  murmurs, diminished S1 Back without kyphosis or  CVAT Respiratory:  clear to auscultation bilaterally, normal work of breathing GI: soft, nontender, nondistended, + BS MS: no deformity or atrophy Extremities no clubbing cyanosis 1+ edema  Boot on left foot Skin: warm and dry,  device pocket is well healed without teathering Neuro:  Strength and sensation are intact Psych: euthymic mood, full affect  EKG:  EKG is ordered today. The ekg ordered today shows Vpacing and no identifiable atrial rhythm  Device interrogation is reviewed today in detail.  See PaceArt for details.  Recent Labs: No results found for requested labs within last 365 days.    Lipid Panel     Component Value Date/Time   CHOL  02/22/2009 0730    111        ATP III CLASSIFICATION:  <200     mg/dL   Desirable  02/24/2009  mg/dL   Borderline High  859-292    mg/dL   High          TRIG 69 02/22/2009 0730   HDL 34* 02/22/2009 0730   CHOLHDL 3.3 02/22/2009 0730   VLDL 14 02/22/2009 0730   LDLCALC  02/22/2009 0730    63        Total Cholesterol/HDL:CHD Risk Coronary Heart Disease Risk Table                     Men   Women  1/2 Average Risk   3.4   3.3  Average Risk       5.0   4.4  2 X Average Risk   9.6   7.1  3 X Average Risk  23.4   11.0        Use the calculated Patient Ratio above and the CHD Risk Table to determine the patient's CHD Risk.        ATP III CLASSIFICATION (LDL):  <100     mg/dL   Optimal  02/24/2009  mg/dL   Near or Above                    Optimal  130-159  mg/dL   Borderline  286-381  mg/dL   High  771-165     mg/dL   Very High     Wt Readings from Last 3 Encounters:  02/10/14 238 lb 12.8 oz (108.319 kg)  01/21/14 236 lb (107.049 kg)  04/30/13 290 lb (131.543 kg)      Other studies Reviewed:    ASSESSMENT AND PLAN: Ventricular tachycardia    Atrial fibrillation  Ischemic cardiomyopathy  Pacemaker-CRT-[employer]   Sick sinus syndrome  No intercurrent Ventricular tachycardia  Reasonable HR excursion  It is not  clear to me entirely why he is on  amiodarone. I suspect is related to his ventricular tachycardia. I will defer the decision of the discontinuation to Dr. Donnie Aho.    urrent medicines are reviewed at length with the patient today.   The patient does not have concerns regarding his medicines.  The following changes were made today:  none  Labs/ tests ordered today include:  Orders Placed This Encounter  Procedures  . Implantable device check  . EKG 12-Lead     Disposition:   FU with me prn  Signed, Sherryl Manges, MD  02/10/2014 3:01 PM     [facility] [address] [place] [address] [phone] (office) [phone] (fax)

## 2014-02-16 ENCOUNTER — Encounter: Payer: Self-pay | Admitting: Vascular Surgery

## 2014-02-18 ENCOUNTER — Ambulatory Visit: Payer: 59 | Admitting: Vascular Surgery

## 2014-03-05 ENCOUNTER — Other Ambulatory Visit: Payer: Self-pay | Admitting: Internal Medicine

## 2014-09-04 ENCOUNTER — Encounter: Payer: Self-pay | Admitting: *Deleted

## 2014-09-21 ENCOUNTER — Ambulatory Visit (INDEPENDENT_AMBULATORY_CARE_PROVIDER_SITE_OTHER): Payer: Medicare Other | Admitting: *Deleted

## 2014-09-21 DIAGNOSIS — I495 Sick sinus syndrome: Secondary | ICD-10-CM | POA: Diagnosis not present

## 2014-09-21 DIAGNOSIS — I482 Chronic atrial fibrillation, unspecified: Secondary | ICD-10-CM

## 2014-09-21 LAB — CUP PACEART INCLINIC DEVICE CHECK
Brady Statistic RA Percent Paced: 0 %
Brady Statistic RV Percent Paced: 94 %
Date Time Interrogation Session: 20160912040000
Lead Channel Impedance Value: 2500 Ohm
Lead Channel Impedance Value: 848 Ohm
Lead Channel Pacing Threshold Amplitude: 1 V
Lead Channel Pacing Threshold Amplitude: 1.4 V
Lead Channel Pacing Threshold Pulse Width: 0.8 ms
Lead Channel Setting Pacing Pulse Width: 0.5 ms
Lead Channel Setting Sensing Sensitivity: 2.5 mV
Lead Channel Setting Sensing Sensitivity: 2.5 mV
MDC IDC MSMT LEADCHNL LV PACING THRESHOLD PULSEWIDTH: 0.5 ms
MDC IDC MSMT LEADCHNL RV IMPEDANCE VALUE: 420 Ohm
MDC IDC SET LEADCHNL LV PACING AMPLITUDE: 2.4 V
MDC IDC SET LEADCHNL RV PACING AMPLITUDE: 2.4 V
MDC IDC SET LEADCHNL RV PACING PULSEWIDTH: 0.8 ms
Pulse Gen Serial Number: 111352

## 2014-09-21 NOTE — Progress Notes (Signed)
CRT-P device check in clinic. Normal device function. Thresholds, sensing, impedance consistent with previous measurements. Histograms appropriate for patient and level of activity. No mode switches or ventricular high rate episodes. Patient bi-ventricularly pacing 94% of the time. Device programmed with appropriate safety margins. Estimated longevity 2.5 years. ROV with SK in February.

## 2014-10-20 ENCOUNTER — Encounter: Payer: Self-pay | Admitting: Internal Medicine

## 2015-03-03 ENCOUNTER — Other Ambulatory Visit: Payer: Self-pay

## 2015-03-03 ENCOUNTER — Other Ambulatory Visit: Payer: Self-pay | Admitting: Internal Medicine

## 2015-03-03 MED ORDER — CARVEDILOL 6.25 MG PO TABS: 6.2500 mg | ORAL_TABLET | Freq: Two times a day (BID) | ORAL | Status: AC

## 2015-03-30 ENCOUNTER — Encounter: Payer: Self-pay | Admitting: Internal Medicine

## 2015-03-30 ENCOUNTER — Ambulatory Visit (INDEPENDENT_AMBULATORY_CARE_PROVIDER_SITE_OTHER): Payer: Medicare Other | Admitting: Internal Medicine

## 2015-03-30 VITALS — BP 141/76 | HR 68 | Ht 70.0 in | Wt 242.8 lb

## 2015-03-30 DIAGNOSIS — I255 Ischemic cardiomyopathy: Secondary | ICD-10-CM | POA: Diagnosis not present

## 2015-03-30 DIAGNOSIS — Z950 Presence of cardiac pacemaker: Secondary | ICD-10-CM

## 2015-03-30 DIAGNOSIS — I482 Chronic atrial fibrillation, unspecified: Secondary | ICD-10-CM

## 2015-03-30 DIAGNOSIS — I472 Ventricular tachycardia, unspecified: Secondary | ICD-10-CM

## 2015-03-30 DIAGNOSIS — I495 Sick sinus syndrome: Secondary | ICD-10-CM

## 2015-03-30 NOTE — Progress Notes (Signed)
Electrophysiology Office Note   Date:  03/30/2015   ID:  John [name], DOB 1922-02-17, MRN [id number]  PCP:  Ezequiel Kayser, MD  Cardiologist: SAY Primary Electrophysiologist:    Sherryl Manges, MD    No chief complaint on file.    History of Present Illness: John [name] is a 82 y.o. male is seen today in followup for  ventricular tachycardia for which she is treated with amiodarone. His history of bradycardia in the context of permanent atrial fibrillation.  He is not on anticoagulation because of "spontaneous bilateral subdural hematomata" which I gather from the note dated 2010 by neurology. Original notes are not available.  He has coronary artery disease with prior bypass surgery. Last underwent catheterization 2012 at which time LAD PCI was attempted   He has stable chronic symptoms and modest edema; chronic dyspnea Daytime somnolence   Today, he denies symptoms of palpitations, chest pain,  claudication, dizziness, presyncope, syncope, bleeding, or neurologic sequela. The patient is tolerating medications without difficulties and is otherwise without complaint today.    Past Medical History  Diagnosis Date  . Ventricular tachycardia (HCC)     Syncope-2010 treated with amiodarone  . Subdural hematoma (HCC) 11/05    Precluding Coumadin  . Atrial fibrillation (HCC)     Permanent  . HTN (hypertension)   . CAD (coronary artery disease)     s/p CABG; Failed LAD PCI 2010  . HLD (hyperlipidemia)   . BPH (benign prostatic hyperplasia)   . Decreased hearing   . Gilbert syndrome   . Carotid artery occlusion   . Dementia   . DM2 (diabetes mellitus, type 2) (HCC)   . Memory loss   . CHF (congestive heart failure) (HCC)   . Stroke [facility])    Past Surgical History  Procedure Laterality Date  . Coronary artery bypass graft  1998  . Hemorrhoid surgery    . Pacemaker insertion      [employer]     Current Outpatient Prescriptions  Medication Sig Dispense Refill    . amiodarone (PACERONE) 200 MG tablet Take 200 mg by mouth daily. Sunday,Monday,Wednesday,Thursday, & Friday only    . aspirin 81 MG tablet Take 81 mg by mouth daily.      . carvedilol (COREG) 6.25 MG tablet Take 1 tablet (6.25 mg total) by mouth 2 (two) times daily with a meal. 60 tablet 11  . cephALEXin (KEFLEX) 500 MG capsule Take 1 capsule (500 mg total) by mouth 3 (three) times daily. 42 capsule 1  . Cholecalciferol (VITAMIN D3) 1000 UNITS CAPS Take 1 capsule by mouth daily.    . ergocalciferol (VITAMIN D2) 50000 UNITS capsule Take 50,000 Units by mouth once a week. Sunday    . metFORMIN (GLUCOPHAGE) 500 MG tablet Take 500 mg by mouth daily with breakfast.      . Multiple Vitamins-Minerals (EYE VITAMINS) CAPS Take by mouth 2 (two) times daily.      . pravastatin (PRAVACHOL) 40 MG tablet Take 40 mg by mouth daily.      . rivastigmine (EXELON) 4.6 mg/24hr Place 1 patch onto the skin daily.      Marland Kitchen triamterene-hydrochlorothiazide (MAXZIDE-25) 37.5-25 MG per tablet Take 0.5 tablets by mouth daily.       No current facility-administered medications for this visit.    Allergies:   Review of patient's allergies indicates no known allergies.   Social History:  The patient  reports that he has never smoked. He has never used smokeless  tobacco. He reports that he does not drink alcohol or use illicit drugs.   Family History:  The patient's    family history includes Cancer in his father; Coronary artery disease in his mother.    ROS:  Please see the history of present illness and past medical history  Otherwise, review of systems is positive for diabetic ulceer.     PHYSICAL EXAM: VS:  BP 141/76 mmHg  Pulse 68  Ht 5\' 10"  (1.778 m)  Wt 242 lb 12.8 oz (110.133 kg)  BMI 34.84 kg/m2 , BMI Body mass index is 34.84 kg/(m^2). GEN: Well nourished, Morbidly obese White male, in no acute distress HEENT: normal Neck:  JVD flat, carotid bruits, or masses Cardiac: REGULAR RATE and RHYTHM ; 2/6   murmurs, diminished S1 Back without kyphosis or CVAT Respiratory:  clear to auscultation bilaterally, normal work of breathing GI: soft, nontender, nondistended, + BS MS: no deformity or atrophy Extremities no clubbing cyanosis 1+ edema  Boot on left foot Skin: warm and dry,  device pocket is well healed without teathering Neuro:  Strength and sensation are intact Psych: euthymic mood, full affect  ECG    Vpacing with BiV paateern  Device interrogation is reviewed today in detail.  See PaceArt for details.  Recent Labs: No results found for requested labs within last 365 days.    Lipid Panel     Component Value Date/Time   CHOL  02/22/2009 0730    111        ATP III CLASSIFICATION:  <200     mg/dL   Desirable  02/24/2009  mg/dL   Borderline High  825-053    mg/dL   High          TRIG 69 02/22/2009 0730   HDL 34* 02/22/2009 0730   CHOLHDL 3.3 02/22/2009 0730   VLDL 14 02/22/2009 0730   LDLCALC  02/22/2009 0730    63        Total Cholesterol/HDL:CHD Risk Coronary Heart Disease Risk Table                     Men   Women  1/2 Average Risk   3.4   3.3  Average Risk       5.0   4.4  2 X Average Risk   9.6   7.1  3 X Average Risk  23.4   11.0        Use the calculated Patient Ratio above and the CHD Risk Table to determine the patient's CHD Risk.        ATP III CLASSIFICATION (LDL):  <100     mg/dL   Optimal  02/24/2009  mg/dL   Near or Above                    Optimal  130-159  mg/dL   Borderline  734-193  mg/dL   High  790-240     mg/dL   Very High     Wt Readings from Last 3 Encounters:  03/30/15 242 lb 12.8 oz (110.133 kg)  02/10/14 238 lb 12.8 oz (108.319 kg)  01/21/14 236 lb (107.049 kg)      Other studies Reviewed:    ASSESSMENT AND PLAN: Ventricular tachycardia    Atrial fibrillation  Ischemic cardiomyopathy  Pacemaker-CRT-[employer]   Sleep disordered breathing  Complete heart block  No intercurrent Ventricular tachycardia  Reasonable HR  excursion  It is not clear to me entirely why  he is on amiodarone. I suspect is related to his ventricular tachycardia. I will defer the decision of the discontinuation to Dr. Donnie Aho.  He is on nocturnal O2  ? Role for CPAP as significant day time somnolence  Will defer to Dr Billie Lade  We will see him next yaer if desired by Dr Billie Lade    urrent medicines are reviewed at length with the patient today.   The patient does not have concerns regarding his medicines.  The following changes were made today:  none  Labs/ tests ordered today include:  No orders of the defined types were placed in this encounter.     Disposition:   FU with me prn  Signed, Sherryl Manges, MD  03/30/2015 3:08 PM     [facility] [address] [place] [address] [phone] (office) [phone] (fax)

## 2015-03-30 NOTE — Patient Instructions (Signed)
Medication Instructions: - Your physician recommends that you continue on your current medications as directed. Please refer to the Current Medication list given to you today.  Labwork: - none  Procedures/Testing: - none  Follow-Up: - Dr. Klein will see you back on an as needed basis.  Any Additional Special Instructions Will Be Listed Below (If Applicable).     If you need a refill on your cardiac medications before your next appointment, please call your pharmacy.   

## 2015-04-28 ENCOUNTER — Encounter (HOSPITAL_BASED_OUTPATIENT_CLINIC_OR_DEPARTMENT_OTHER): Payer: Medicare Other

## 2015-04-30 ENCOUNTER — Ambulatory Visit
Admission: RE | Admit: 2015-04-30 | Discharge: 2015-04-30 | Disposition: A | Discharge status: Home / Self Care | Payer: Medicare Other | Source: Ambulatory Visit | Attending: Surgery | Admitting: Surgery

## 2015-04-30 ENCOUNTER — Encounter: Payer: Medicare Other | Attending: Surgery | Admitting: Surgery

## 2015-04-30 ENCOUNTER — Other Ambulatory Visit: Payer: Self-pay | Admitting: Surgery

## 2015-04-30 DIAGNOSIS — I2510 Atherosclerotic heart disease of native coronary artery without angina pectoris: Secondary | ICD-10-CM | POA: Diagnosis not present

## 2015-04-30 DIAGNOSIS — I10 Essential (primary) hypertension: Secondary | ICD-10-CM | POA: Insufficient documentation

## 2015-04-30 DIAGNOSIS — I739 Peripheral vascular disease, unspecified: Secondary | ICD-10-CM | POA: Diagnosis not present

## 2015-04-30 DIAGNOSIS — L97529 Non-pressure chronic ulcer of other part of left foot with unspecified severity: Secondary | ICD-10-CM | POA: Insufficient documentation

## 2015-04-30 DIAGNOSIS — F0390 Unspecified dementia without behavioral disturbance: Secondary | ICD-10-CM | POA: Diagnosis not present

## 2015-04-30 DIAGNOSIS — Z7984 Long term (current) use of oral hypoglycemic drugs: Secondary | ICD-10-CM | POA: Diagnosis not present

## 2015-04-30 DIAGNOSIS — D649 Anemia, unspecified: Secondary | ICD-10-CM | POA: Insufficient documentation

## 2015-04-30 DIAGNOSIS — E11319 Type 2 diabetes mellitus with unspecified diabetic retinopathy without macular edema: Secondary | ICD-10-CM | POA: Insufficient documentation

## 2015-04-30 DIAGNOSIS — M869 Osteomyelitis, unspecified: Secondary | ICD-10-CM

## 2015-04-30 DIAGNOSIS — X58XXXA Exposure to other specified factors, initial encounter: Secondary | ICD-10-CM | POA: Insufficient documentation

## 2015-04-30 DIAGNOSIS — E11621 Type 2 diabetes mellitus with foot ulcer: Secondary | ICD-10-CM | POA: Insufficient documentation

## 2015-04-30 DIAGNOSIS — L97522 Non-pressure chronic ulcer of other part of left foot with fat layer exposed: Secondary | ICD-10-CM | POA: Insufficient documentation

## 2015-04-30 DIAGNOSIS — E785 Hyperlipidemia, unspecified: Secondary | ICD-10-CM | POA: Diagnosis not present

## 2015-04-30 DIAGNOSIS — I255 Ischemic cardiomyopathy: Secondary | ICD-10-CM | POA: Diagnosis not present

## 2015-04-30 DIAGNOSIS — Z79899 Other long term (current) drug therapy: Secondary | ICD-10-CM | POA: Diagnosis not present

## 2015-04-30 DIAGNOSIS — I252 Old myocardial infarction: Secondary | ICD-10-CM | POA: Insufficient documentation

## 2015-04-30 DIAGNOSIS — I70245 Atherosclerosis of native arteries of left leg with ulceration of other part of foot: Secondary | ICD-10-CM | POA: Insufficient documentation

## 2015-04-30 DIAGNOSIS — S81802A Unspecified open wound, left lower leg, initial encounter: Secondary | ICD-10-CM

## 2015-04-30 DIAGNOSIS — I472 Ventricular tachycardia: Secondary | ICD-10-CM | POA: Diagnosis not present

## 2015-04-30 DIAGNOSIS — I4891 Unspecified atrial fibrillation: Secondary | ICD-10-CM | POA: Diagnosis not present

## 2015-04-30 DIAGNOSIS — S41112A Laceration without foreign body of left upper arm, initial encounter: Secondary | ICD-10-CM | POA: Insufficient documentation

## 2015-04-30 DIAGNOSIS — Z950 Presence of cardiac pacemaker: Secondary | ICD-10-CM | POA: Diagnosis not present

## 2015-04-30 DIAGNOSIS — L539 Erythematous condition, unspecified: Secondary | ICD-10-CM

## 2015-04-30 DIAGNOSIS — Z7982 Long term (current) use of aspirin: Secondary | ICD-10-CM | POA: Diagnosis not present

## 2015-05-01 NOTE — Progress Notes (Addendum)
John [name] ([id number]) Visit Report for 04/30/2015 Allergy List Details Patient Name: John [name], John [name]. Date of Service: 04/30/2015 1:30 PM Medical Record Number: [id number] Patient Account Number: [id number] Date of Birth/Sex: 08/19/1922 (82 y.o. Male) Treating RN: Afful, RN, BSN, [place] Sink Primary Care Physician: Rodrigo Ran Other Clinician: Referring Physician: Rodrigo Ran Treating Physician/Extender: Rudene Re in Treatment: 0 Allergies Active Allergies no known allergies Allergy Notes Electronic Signature(s) Signed: 04/30/2015 12:56:57 PM By: Elpidio Eric BSN, RN Entered By: Elpidio Eric on 04/30/2015 12:56:57 John [name] ([id number]) -------------------------------------------------------------------------------- Arrival Information Details Patient Name: John [name]. Date of Service: 04/30/2015 1:30 PM Medical Record Number: [id number] Patient Account Number: [id number] Date of Birth/Sex: January 03, 1923 (82 y.o. Male) Treating RN: Afful, RN, BSN, [place] Sink Primary Care Physician: Rodrigo Ran Other Clinician: Referring Physician: Rodrigo Ran Treating Physician/Extender: Rudene Re in Treatment: 0 Visit Information Patient Arrived: Cloyde Reams Time: 13:53 Accompanied By: dtr Transfer Assistance: None Patient Identification Verified: Yes Secondary Verification Process Yes Completed: Patient Requires Transmission- No Based Precautions: Patient Has Alerts: Yes Patient Alerts: Patient on Blood Thinner Electronic Signature(s) Signed: 04/30/2015 4:08:52 PM By: Elpidio Eric BSN, RN Entered By: Elpidio Eric on 04/30/2015 13:54:07 John [name] ([id number]) -------------------------------------------------------------------------------- Clinic Level of Care Assessment Details Patient Name: John [name]. Date of Service: 04/30/2015 1:30 PM Medical Record Number: [id number] Patient Account Number: [id number] Date of Birth/Sex: Oct 14, 1922 (82 y.o.  Male) Treating RN: Afful, RN, BSN, [place] Sink Primary Care Physician: Rodrigo Ran Other Clinician: Referring Physician: Rodrigo Ran Treating Physician/Extender: Rudene Re in Treatment: 0 Clinic Level of Care Assessment Items TOOL 1 Quantity Score []  - Use when EandM and Procedure is performed on INITIAL visit 0 ASSESSMENTS - Nursing Assessment / Reassessment X - General Physical Exam (combine w/ comprehensive assessment (listed just 1 20 below) when performed on new pt. evals) X - Comprehensive Assessment (HX, ROS, Risk Assessments, Wounds Hx, etc.) 1 25 ASSESSMENTS - Wound and Skin Assessment / Reassessment []  - Dermatologic / Skin Assessment (not related to wound area) 0 ASSESSMENTS - Ostomy and/or Continence Assessment and Care []  - Incontinence Assessment and Management 0 []  - Ostomy Care Assessment and Management (repouching, etc.) 0 PROCESS - Coordination of Care X - Simple Patient / Family Education for ongoing care 1 15 []  - Complex (extensive) Patient / Family Education for ongoing care 0 X - Staff obtains , Records, Test Results / Process Orders 1 10 X - Staff telephones HHA, Nursing Homes / Clarify orders / etc 1 10 []  - Routine Transfer to another Facility (non-emergent condition) 0 []  - Routine Hospital Admission (non-emergent condition) 0 X - New Admissions / / Ordering NPWT, Apligraf, etc. 1 15 []  - Emergency Hospital Admission (emergent condition) 0 PROCESS - Special Needs []  - Pediatric / Minor Patient Management 0 []  - Isolation Patient Management 0 [place], Traveon E. ( ) []  - Hearing / Language / Visual special needs 0 []  - Assessment of Community assistance (transportation, D/C planning, etc.) 0 []  - Additional assistance / Altered mentation 0 []  - Support Surface(s) Assessment (bed, cushion, seat, etc.) 0 INTERVENTIONS - Miscellaneous []  - External ear exam 0 []  - Patient Transfer (multiple staff / /  Similar devices) 0 []  - Simple Staple / Suture removal (25 or less) 0 []  - Complex Staple / Suture removal (26 or more) 0 []  - Hypo/Hyperglycemic Management (do not check if billed separately) 0 X - Ankle / Brachial Index (ABI) - do not check if billed  separately 1 15 Has the patient been seen at the hospital within the last three years: Yes Total Score: 110 Level Of Care: New/Established - Level 3 Electronic Signature(s) Signed: 04/30/2015 3:44:03 PM By: Elpidio Eric BSN, RN Entered By: Elpidio Eric on 04/30/2015 15:44:03 John [name] ([id number]) -------------------------------------------------------------------------------- Encounter Discharge Information Details Patient Name: John [name]. Date of Service: 04/30/2015 1:30 PM Medical Record Number: [id number] Patient Account Number: [id number] Date of Birth/Sex: 1922/06/15 (82 y.o. Male) Treating RN: Afful, RN, BSN, [place] Sink Primary Care Physician: Rodrigo Ran Other Clinician: Referring Physician: Rodrigo Ran Treating Physician/Extender: Rudene Re in Treatment: 0 Encounter Discharge Information Items Discharge Pain Level: 0 Discharge Condition: Stable Ambulatory Status: Walker Discharge Destination: Home Transportation: Private Auto wife, Accompanied By: sisterinlaw Schedule Follow-up Appointment: No Medication Reconciliation completed No and provided to Patient/Care Maalik Pinn: Provided on Clinical Summary of Care: 04/30/2015 Form Type Recipient Paper Patient RR Electronic Signature(s) Signed: 04/30/2015 3:49:04 PM By: Elpidio Eric BSN, RN Previous Signature: 04/30/2015 3:09:46 PM Version By: Gwenlyn Perking Entered By: Elpidio Eric on 04/30/2015 15:49:04 John [name] ([id number]) -------------------------------------------------------------------------------- Lower Extremity Assessment Details Patient Name: John [name]. Date of Service: 04/30/2015 1:30 PM Medical Record Number: [id number] Patient Account  Number: [id number] Date of Birth/Sex: [birth year] (82 y.o. Male) Treating RN: Afful, RN, BSN, [place] Sink Primary Care Physician: Rodrigo Ran Other Clinician: Referring Physician: Rodrigo Ran Treating Physician/Extender: Rudene Re in Treatment: 0 Vascular Assessment Pulses: Posterior Tibial Dorsalis Pedis Palpable: [Left:Yes] [Right:No] Doppler: [Left:Monophasic] [Right:Monophasic] Extremity colors, hair growth, and conditions: Extremity Color: [Left:Red] [Right:Mottled] Hair Growth on Extremity: [Left:Yes] [Right:Yes] Temperature of Extremity: [Left:Warm] [Right:Warm] Capillary Refill: [Left:< 3 seconds] [Right:< 3 seconds] Blood Pressure: Brachial: [Left:138] Dorsalis Pedis: 98 [Left:Dorsalis Pedis:] Ankle: Posterior Tibial: [Left:Posterior Tibial: 0.71] Toe Nail Assessment Left: Right: Thick: No No Discolored: Yes Yes Deformed: No No Improper Length and Hygiene: No No Electronic Signature(s) Signed: 04/30/2015 4:08:52 PM By: Elpidio Eric BSN, RN Entered By: Elpidio Eric on 04/30/2015 14:21:13 John [name] ([id number]) -------------------------------------------------------------------------------- Multi Wound Chart Details Patient Name: John [name]. Date of Service: 04/30/2015 1:30 PM Medical Record Number: [id number] Patient Account Number: [id number] Date of Birth/Sex: [birth year] (82 y.o. Male) Treating RN: Clover Mealy, RN, BSN, [place] Sink Primary Care Physician: Rodrigo Ran Other Clinician: Referring Physician: Rodrigo Ran Treating Physician/Extender: Rudene Re in Treatment: 0 Vital Signs Height(in): 72 Pulse(bpm): 69 Weight(lbs): 239 Blood Pressure 138/48 (mmHg): Body Mass Index(BMI): 32 Temperature(F): 97.8 Respiratory Rate 18 (breaths/min): Photos: [1:No Photos] [2:No Photos] [3:No Photos] Wound Location: [1:Left Forearm] [2:Left Toe Second - Dorsal] [3:Right Toe Great - Dorsal] Wounding Event: [1:Trauma] [2:Gradually Appeared] [3:Gradually  Appeared] Primary Etiology: [1:Trauma, Other] [2:Diabetic Wound/Ulcer of the Lower Extremity] [3:Diabetic Wound/Ulcer of the Lower Extremity] Comorbid History: [1:Anemia, Arrhythmia, Coronary Artery Disease, Coronary Artery Disease, Hypertension, Myocardial Hypertension, Myocardial Infarction, Type II Diabetes, Dementia] [2:Anemia, Arrhythmia, Infarction, Type II Diabetes, Dementia] [3:Anemia,  Arrhythmia, Coronary Artery Disease, Hypertension, Myocardial Infarction, Type II Diabetes, Dementia] Date Acquired: [1:04/29/2015] [2:01/12/2015] [3:02/22/2015] Weeks of Treatment: [1:0] [2:0] [3:0] Wound Status: [1:Open] [2:Open] [3:Open] Measurements L x W x D 4x1x0.1 [2:0.5x0.5x0.1] [3:0.4x0.4x0.1] (cm) Area (cm) : [1:3.142] [2:0.196] [3:0.126] Volume (cm) : [1:0.314] [2:0.02] [3:0.013] % Reduction in Area: [1:N/A] [2:N/A] [3:0.00%] % Reduction in Volume: N/A [2:N/A] [3:0.00%] Classification: [1:Full Thickness Without Exposed Support Structures] [2:Grade 1] [3:Grade 1] Exudate Amount: [1:Large] [2:Medium] [3:Small] Exudate Type: [1:Serosanguineous] [2:Serosanguineous] [3:Serous] Exudate Color: [1:red, brown] [2:red, brown] [3:amber] Wound Margin: [1:Distinct, outline attached Distinct, outline attached] [3:Distinct, outline attached] Granulation Amount: [1:Large (67-100%)] [2:Large (  67-100%)] [3:Medium (34-66%)] Granulation Quality: [1:Red] [2:Pink, Pale] [3:Pink, Pale] Necrotic Amount: [1:None Present (0%)] [2:Small (1-33%)] [3:None Present (0%)] Exposed Structures: Vosler, Malaquias E. ([id number]) Fascia: No Fascia: No Fascia: No Fat: No Fat: No Fat: No Tendon: No Tendon: No Tendon: No Muscle: No Muscle: No Muscle: No Joint: No Joint: No Joint: No Bone: No Bone: No Bone: No Limited to Skin Limited to Skin Limited to Skin Breakdown Breakdown Breakdown Epithelialization: None None None Periwound Skin Texture: No Abnormalities Noted Edema: No Edema: No Excoriation:  No Excoriation: No Induration: No Induration: No Callus: No Callus: No Crepitus: No Crepitus: No Fluctuance: No Fluctuance: No Friable: No Friable: No Rash: No Rash: No Scarring: No Scarring: No Periwound Skin No Abnormalities Noted Moist: Yes Maceration: No Moisture: Maceration: No Moist: No Dry/Scaly: No Dry/Scaly: No Periwound Skin Color: Erythema: Yes Atrophie Blanche: No Atrophie Blanche: No Cyanosis: No Cyanosis: No Ecchymosis: No Ecchymosis: No Erythema: No Erythema: No Hemosiderin Staining: No Hemosiderin Staining: No Mottled: No Mottled: No Pallor: No Pallor: No Rubor: No Rubor: No Erythema Location: Circumferential N/A N/A Temperature: No Abnormality No Abnormality No Abnormality Tenderness on Yes Yes Yes Palpation: Wound Preparation: Ulcer Cleansing: Ulcer Cleansing: Ulcer Cleansing: Rinsed/Irrigated with Rinsed/Irrigated with Rinsed/Irrigated with Saline Saline Saline Topical Anesthetic Topical Anesthetic Topical Anesthetic Applied: Other: lidocaine Applied: Other: lidocaine Applied: Other: lidocaine 4% 4% 4% Treatment Notes Electronic Signature(s) Signed: 04/30/2015 4:08:52 PM By: Elpidio Eric BSN, RN Entered By: Elpidio Eric on 04/30/2015 14:46:04 John [name] ([id number]) -------------------------------------------------------------------------------- Multi-Disciplinary Care Plan Details Patient Name: John [name]. Date of Service: 04/30/2015 1:30 PM Medical Record Number: [id number] Patient Account Number: [id number] Date of Birth/Sex: [birth year] (82 y.o. Male) Treating RN: Afful, RN, BSN, [place] Sink Primary Care Physician: Rodrigo Ran Other Clinician: Referring Physician: Rodrigo Ran Treating Physician/Extender: Rudene Re in Treatment: 0 Active Inactive Orientation to the Wound Care Program Nursing Diagnoses: Knowledge deficit related to the wound healing center program Goals: Patient/caregiver will verbalize  understanding of the Wound Healing Center Program Date Initiated: 04/30/2015 Goal Status: Active Interventions: Provide education on orientation to the wound center Notes: Wound/Skin Impairment Nursing Diagnoses: Impaired tissue integrity Knowledge deficit related to ulceration/compromised skin integrity Goals: Patient/caregiver will verbalize understanding of skin care regimen Date Initiated: 04/30/2015 Goal Status: Active Ulcer/skin breakdown will have a volume reduction of 30% by week 4 Date Initiated: 04/30/2015 Goal Status: Active Ulcer/skin breakdown will have a volume reduction of 50% by week 8 Date Initiated: 04/30/2015 Goal Status: Active Ulcer/skin breakdown will have a volume reduction of 80% by week 12 Date Initiated: 04/30/2015 Goal Status: Active Ulcer/skin breakdown will heal within 14 weeks Date Initiated: 04/30/2015 SLAYDE, BRAULT ([id number]) Goal Status: Active Interventions: Assess patient/caregiver ability to obtain necessary supplies Assess patient/caregiver ability to perform ulcer/skin care regimen upon admission and as needed Assess ulceration(s) every visit Provide education on ulcer and skin care Notes: Electronic Signature(s) Signed: 04/30/2015 4:08:52 PM By: Elpidio Eric BSN, RN Entered By: Elpidio Eric on 04/30/2015 14:45:14 John [name] ([id number]) -------------------------------------------------------------------------------- Pain Assessment Details Patient Name: John [name]. Date of Service: 04/30/2015 1:30 PM Medical Record Number: [id number] Patient Account Number: [id number] Date of Birth/Sex: July 19, 1922 (82 y.o. Male) Treating RN: Afful, RN, BSN, [place] Sink Primary Care Physician: Rodrigo Ran Other Clinician: Referring Physician: Rodrigo Ran Treating Physician/Extender: Rudene Re in Treatment: 0 Active Problems Location of Pain Severity and Description of Pain Patient Has Paino No Site Locations Pain Management and  Medication Current Pain Management: Electronic Signature(s) Signed: 04/30/2015 4:08:52 PM  By: Elpidio Eric BSN, RN Entered By: Elpidio Eric on 04/30/2015 13:54:15 John [name] ([id number]) -------------------------------------------------------------------------------- Patient/Caregiver Education Details Patient Name: John [name] Date of Service: 04/30/2015 1:30 PM Medical Record Number: [id number] Patient Account Number: [id number] Date of Birth/Gender: 1922/08/06 (82 y.o. Male) Treating RN: Afful, RN, BSN, [place] Sink Primary Care Physician: Rodrigo Ran Other Clinician: Referring Physician: Rodrigo Ran Treating Physician/Extender: Rudene Re in Treatment: 0 Education Assessment Education Provided To: Patient Education Topics Provided Welcome To The Wound Care Center: Methods: Explain/Verbal Responses: State content correctly Wound/Skin Impairment: Methods: Explain/Verbal Responses: State content correctly Electronic Signature(s) Signed: 04/30/2015 3:49:21 PM By: Elpidio Eric BSN, RN Entered By: Elpidio Eric on 04/30/2015 15:49:21 John [name] ([id number]) -------------------------------------------------------------------------------- Wound Assessment Details Patient Name: Lang Snow E. Date of Service: 04/30/2015 1:30 PM Medical Record Number: [id number] Patient Account Number: [id number] Date of Birth/Sex: May 27, 1922 (82 y.o. Male) Treating RN: Afful, RN, BSN, [place] Sink Primary Care Physician: Rodrigo Ran Other Clinician: Referring Physician: Rodrigo Ran Treating Physician/Extender: Rudene Re in Treatment: 0 Wound Status Wound Number: 1 Primary Trauma, Other Etiology: Wound Location: Left Forearm Wound Open Wounding Event: Trauma Status: Date Acquired: 04/29/2015 Comorbid Anemia, Arrhythmia, Coronary Artery Weeks Of Treatment: 0 History: Disease, Hypertension, Myocardial Clustered Wound: No Infarction, Type II Diabetes, Dementia Photos Photo  Uploaded By: Elpidio Eric on 04/30/2015 15:53:43 Wound Measurements Length: (cm) 4 Width: (cm) 1 Depth: (cm) 0.1 Area: (cm) 3.142 Volume: (cm) 0.314 % Reduction in Area: % Reduction in Volume: Epithelialization: None Tunneling: No Undermining: No Wound Description Full Thickness Without Exposed Classification: Support Structures Wound Margin: Distinct, outline attached Exudate Large Amount: Exudate Type: Serosanguineous Exudate Color: red, brown Foul Odor After Cleansing: No Wound Bed Granulation Amount: Large (67-100%) Exposed Structure Granulation Quality: Red Fascia Exposed: No [place], Keston E. ([id number]) Necrotic Amount: None Present (0%) Fat Layer Exposed: No Tendon Exposed: No Muscle Exposed: No Joint Exposed: No Bone Exposed: No Limited to Skin Breakdown Periwound Skin Texture Texture Color No Abnormalities Noted: No No Abnormalities Noted: No Erythema: Yes Moisture Erythema Location: Circumferential No Abnormalities Noted: No Temperature / Pain Temperature: No Abnormality Tenderness on Palpation: Yes Wound Preparation Ulcer Cleansing: Rinsed/Irrigated with Saline Topical Anesthetic Applied: Other: lidocaine 4%, Treatment Notes Wound #1 (Left Forearm) 1. Cleansed with: Clean wound with Normal Saline 4. Dressing Applied: Foam 5. Secondary Dressing Applied Gauze and Kerlix/Conform 7. Secured with Self adhesive bandage Electronic Signature(s) Signed: 04/30/2015 4:08:52 PM By: Elpidio Eric BSN, RN Entered By: Elpidio Eric on 04/30/2015 14:08:29 John [name] ([id number]) -------------------------------------------------------------------------------- Wound Assessment Details Patient Name: Lang Snow E. Date of Service: 04/30/2015 1:30 PM Medical Record Number: [id number] Patient Account Number: [id number] Date of Birth/Sex: December 29, 1922 (82 y.o. Male) Treating RN: Afful, RN, BSN, [place] Sink Primary Care Physician: Rodrigo Ran Other  Clinician: Referring Physician: Rodrigo Ran Treating Physician/Extender: Rudene Re in Treatment: 0 Wound Status Wound Number: 2 Primary Diabetic Wound/Ulcer of the Lower Etiology: Extremity Wound Location: Left Toe Second - Dorsal Wound Open Wounding Event: Gradually Appeared Status: Date Acquired: 01/12/2015 Comorbid Anemia, Arrhythmia, Coronary Artery Weeks Of Treatment: 0 History: Disease, Hypertension, Myocardial Clustered Wound: No Infarction, Type II Diabetes, Dementia Photos Photo Uploaded By: Elpidio Eric on 04/30/2015 15:53:44 Wound Measurements Length: (cm) 0.5 Width: (cm) 0.5 Depth: (cm) 0.1 Area: (cm) 0.196 Volume: (cm) 0.02 % Reduction in Area: % Reduction in Volume: Epithelialization: None Tunneling: No Undermining: No Wound Description Classification: Grade 1 Wound Margin: Distinct, outline attached Exudate Amount: Medium Exudate Type: Serosanguineous Exudate Color: red, brown Foul  Odor After Cleansing: No Wound Bed Granulation Amount: Large (67-100%) Exposed Structure Granulation Quality: Pink, Pale Fascia Exposed: No Necrotic Amount: Small (1-33%) Fat Layer Exposed: No Necrotic Quality: Adherent Slough Tendon Exposed: No Ursua, Niv E. ([id number]) Muscle Exposed: No Joint Exposed: No Bone Exposed: No Limited to Skin Breakdown Periwound Skin Texture Texture Color No Abnormalities Noted: No No Abnormalities Noted: No Callus: No Atrophie Blanche: No Crepitus: No Cyanosis: No Excoriation: No Ecchymosis: No Fluctuance: No Erythema: No Friable: No Hemosiderin Staining: No Induration: No Mottled: No Localized Edema: No Pallor: No Rash: No Rubor: No Scarring: No Temperature / Pain Moisture Temperature: No Abnormality No Abnormalities Noted: No Tenderness on Palpation: Yes Dry / Scaly: No Maceration: No Moist: Yes Wound Preparation Ulcer Cleansing: Rinsed/Irrigated with Saline Topical Anesthetic Applied: Other:  lidocaine 4%, Treatment Notes Wound #2 (Left, Dorsal Toe Second) 1. Cleansed with: Clean wound with Normal Saline 4. Dressing Applied: Aquacel Ag 5. Secondary Dressing Applied Kerlix/Conform 7. Secured with Secretary/administrator) Signed: 04/30/2015 4:08:52 PM By: Elpidio Eric BSN, RN Entered By: Elpidio Eric on 04/30/2015 14:11:35 John [name] ([id number]) -------------------------------------------------------------------------------- Wound Assessment Details Patient Name: Lang Snow E. Date of Service: 04/30/2015 1:30 PM Medical Record Number: [id number] Patient Account Number: [id number] Date of Birth/Sex: 03/05/1922 (82 y.o. Male) Treating RN: Afful, RN, BSN, [place] Sink Primary Care Physician: Rodrigo Ran Other Clinician: Referring Physician: Rodrigo Ran Treating Physician/Extender: Rudene Re in Treatment: 0 Wound Status Wound Number: 3 Primary Diabetic Wound/Ulcer of the Lower Etiology: Extremity Wound Location: Right, Dorsal Toe Great Wound Status: Healed - Epithelialized Wounding Event: Gradually Appeared Date Acquired: 02/22/2015 Weeks Of Treatment: 0 Clustered Wound: No Photos Photo Uploaded By: Elpidio Eric on 04/30/2015 15:53:44 Wound Measurements Length: (cm) Width: (cm) Depth: (cm) Area: (cm) Volume: (cm) 0 % Reduction in Area: 0 % Reduction in Volume: 0 0 0 Periwound Skin Texture Texture Color No Abnormalities Noted: No No Abnormalities Noted: No Moisture No Abnormalities Noted: No Electronic Signature(s) Signed: 04/30/2015 4:08:52 PM By: Elpidio Eric BSN, RN Entered By: Elpidio Eric on 04/30/2015 15:47:02 John [name] ([id number]) -------------------------------------------------------------------------------- Vitals Details Patient Name: John [name]. Date of Service: 04/30/2015 1:30 PM Medical Record Number: [id number] Patient Account Number: [id number] Date of Birth/Sex: [birth year] (82 y.o. Male) Treating RN: Afful, RN,  BSN, [place] Sink Primary Care Physician: Rodrigo Ran Other Clinician: Referring Physician: Rodrigo Ran Treating Physician/Extender: Rudene Re in Treatment: 0 Vital Signs Time Taken: 14:00 Temperature (F): 97.8 Height (in): 72 Pulse (bpm): 69 Source: Stated Respiratory Rate (breaths/min): 18 Weight (lbs): 239 Blood Pressure (mmHg): 138/48 Source: Measured Reference Range: 80 - 120 mg / dl Body Mass Index (BMI): 32.4 Electronic Signature(s) Signed: 04/30/2015 4:08:52 PM By: Elpidio Eric BSN, RN Entered By: Elpidio Eric on 04/30/2015 13:59:43

## 2015-05-01 NOTE — Progress Notes (Signed)
John [name], John [name] ([id number]) Visit Report for 04/30/2015 Abuse/Suicide Risk Screen Details Patient Name: John [name], John [name]. Date of Service: 04/30/2015 1:30 PM Medical Record Number: [id number] Patient Account Number: [id number] Date of Birth/Sex: 01-03-1923 (82 y.o. Male) Treating RN: Afful, RN, BSN, [place] Sink Primary Care Physician: Rodrigo Ran Other Clinician: Referring Physician: Rodrigo Ran Treating Physician/Extender: Rudene Re in Treatment: 0 Abuse/Suicide Risk Screen Items Answer ABUSE/SUICIDE RISK SCREEN: Has anyone close to you tried to hurt or harm you recentlyo No Do you feel uncomfortable with anyone in your familyo No Has anyone forced you do things that you didnot want to doo No Do you have any thoughts of harming yourselfo No Patient displays signs or symptoms of abuse and/or neglect. No Electronic Signature(s) Signed: 04/30/2015 4:08:52 PM By: Elpidio Eric BSN, RN Entered By: Elpidio Eric on 04/30/2015 13:54:26 John [name] ([id number]) -------------------------------------------------------------------------------- Activities of Daily Living Details Patient Name: John [name], John [name]. Date of Service: 04/30/2015 1:30 PM Medical Record Number: [id number] Patient Account Number: [id number] Date of Birth/Sex: 01/19/1922 (82 y.o. Male) Treating RN: Afful, RN, BSN, [place] Sink Primary Care Physician: Rodrigo Ran Other Clinician: Referring Physician: Rodrigo Ran Treating Physician/Extender: Rudene Re in Treatment: 0 Activities of Daily Living Items Answer Activities of Daily Living (Please select one for each item) Drive Automobile Not Able Take Medications Need Assistance Use Telephone Need Assistance Care for Appearance Need Assistance Use Toilet Need Assistance Bath / Shower Need Assistance Dress Self Need Assistance Feed Self Need Assistance Walk Need Assistance Get In / Out Bed Need Assistance Housework Need Assistance Prepare Meals Need  Assistance Handle Money Need Assistance Shop for Self Need Assistance Electronic Signature(s) Signed: 04/30/2015 4:08:52 PM By: Elpidio Eric BSN, RN Entered By: Elpidio Eric on 04/30/2015 13:54:55 John [name] ([id number]) -------------------------------------------------------------------------------- Education Assessment Details Patient Name: John [name]. Date of Service: 04/30/2015 1:30 PM Medical Record Number: [id number] Patient Account Number: [id number] Date of Birth/Sex: 07-05-1922 (82 y.o. Male) Treating RN: Afful, RN, BSN, [place] Sink Primary Care Physician: Rodrigo Ran Other Clinician: Referring Physician: Rodrigo Ran Treating Physician/Extender: Rudene Re in Treatment: 0 Primary Learner Assessed: Caregiver wife Learning Preferences/Education Level/Primary Language Learning Preference: Explanation Highest Education Level: College or Above Preferred Language: English Cognitive Barrier Assessment/Beliefs Language Barrier: No Physical Barrier Assessment Impaired Vision: Yes Glasses Impaired Hearing: Yes Decreased Hand dexterity: No Knowledge/Comprehension Assessment Knowledge Level: Low Comprehension Level: Low Ability to understand written Low instructions: Ability to understand verbal Low instructions: Motivation Assessment Anxiety Level: Calm Cooperation: Cooperative Education Importance: Acknowledges Need Interest in Health Problems: Asks Questions Perception: Coherent Willingness to Engage in Self- Low Management Activities: Readiness to Engage in Self- Low Management Activities: Electronic Signature(s) Signed: 04/30/2015 4:08:52 PM By: Elpidio Eric BSN, RN Entered By: Elpidio Eric on 04/30/2015 13:55:59 John [name] ([id number]) -------------------------------------------------------------------------------- Fall Risk Assessment Details Patient Name: John [name]. Date of Service: 04/30/2015 1:30 PM Medical Record Number:  [id number] Patient Account Number: [id number] Date of Birth/Sex: 10-04-1922 (82 y.o. Male) Treating RN: Afful, RN, BSN,  Sink Primary Care Physician: Rodrigo Ran Other Clinician: Referring Physician: Rodrigo Ran Treating Physician/Extender: Rudene Re in Treatment: 0 Fall Risk Assessment Items Have you had 2 or more falls in the last 12 monthso 0 Yes Have you had any fall that resulted in injury in the last 12 monthso 0 No FALL RISK ASSESSMENT: History of falling - immediate or within 3 months 25 Yes Secondary diagnosis 0 No Ambulatory aid None/bed rest/wheelchair/nurse 0 No Crutches/cane/walker 15 Yes Furniture 0 No IV Access/Saline  Lock 0 No Gait/Training Normal/bed rest/immobile 0 Yes Weak 10 Yes Impaired 20 Yes Mental Status Oriented to own ability 0 Yes Electronic Signature(s) Signed: 04/30/2015 4:08:52 PM By: Elpidio Eric BSN, RN Entered By: Elpidio Eric on 04/30/2015 13:56:17 John [name] ([id number]) -------------------------------------------------------------------------------- Foot Assessment Details Patient Name: John Snow E. Date of Service: 04/30/2015 1:30 PM Medical Record Number: [id number] Patient Account Number: [id number] Date of Birth/Sex: 06/01/1922 (82 y.o. Male) Treating RN: Afful, RN, BSN, [place] Sink Primary Care Physician: Rodrigo Ran Other Clinician: Referring Physician: Rodrigo Ran Treating Physician/Extender: Rudene Re in Treatment: 0 Foot Assessment Items Site Locations + = Sensation present, - = Sensation absent, C = Callus, U = Ulcer R = Redness, W = Warmth, M = Maceration, PU = Pre-ulcerative lesion F = Fissure, S = Swelling, D = Dryness Assessment Right: Left: Other Deformity: No No Prior Foot Ulcer: No No Prior Amputation: No No Charcot Joint: No No Ambulatory Status: Ambulatory With Help Assistance Device: Walker Gait: Surveyor, mining) Signed: 04/30/2015 4:08:52 PM By: Elpidio Eric BSN, RN Entered  By: Elpidio Eric on 04/30/2015 13:56:36 John [name] ([id number]) -------------------------------------------------------------------------------- Nutrition Risk Assessment Details Patient Name: John [name]. Date of Service: 04/30/2015 1:30 PM Medical Record Number: [id number] Patient Account Number: [id number] Date of Birth/Sex: 04-Dec-1922 (82 y.o. Male) Treating RN: Clover Mealy, RN, BSN, [place] Sink Primary Care Physician: Rodrigo Ran Other Clinician: Referring Physician: Rodrigo Ran Treating Physician/Extender: Rudene Re in Treatment: 0 Height (in): Weight (lbs): Body Mass Index (BMI): Nutrition Risk Assessment Items NUTRITION RISK SCREEN: I have an illness or condition that made me change the kind and/or 0 No amount of food I eat I eat fewer than two meals per day 0 No I eat few fruits and vegetables, or milk products 0 No I have three or more drinks of beer, liquor or wine almost every day 0 No I have tooth or mouth problems that make it hard for me to eat 0 No I don't always have enough money to buy the food I need 0 No I eat alone most of the time 0 No I take three or more different prescribed or over-the-counter drugs a 0 No day Without wanting to, I have lost or gained 10 pounds in the last six 0 No months I am not always physically able to shop, cook and/or feed myself 0 No Nutrition Protocols Good Risk Protocol 0 No interventions needed Moderate Risk Protocol Electronic Signature(s) Signed: 04/30/2015 4:08:52 PM By: Elpidio Eric BSN, RN Entered By: Elpidio Eric on 04/30/2015 13:56:24

## 2015-05-01 NOTE — Progress Notes (Signed)
ELIC, VENCILL ([id number]) Visit Report for 04/30/2015 Chief Complaint Document Details Patient Name: John [name], John [name]. Date of Service: 04/30/2015 1:30 PM Medical Record Number: [id number] Patient Account Number: [id number] Date of Birth/Sex: June 25, 1922 (82 y.o. Male) Treating RN: Afful, RN, BSN, [place] Sink Primary Care Physician: Rodrigo Ran Other Clinician: Referring Physician: Rodrigo Ran Treating Physician/Extender: Rudene Re in Treatment: 0 Information Obtained from: Patient Chief Complaint Patients presents for treatment of an open diabetic ulcer which she's had on his left toe on the dorsum for about 18 months and recently had a injury to his left forearm a day ago Electronic Signature(s) Signed: 04/30/2015 4:48:07 PM By: Evlyn Kanner MD, FACS Entered By: Evlyn Kanner on 04/30/2015 16:48:07 John [name] ([id number]) -------------------------------------------------------------------------------- HPI Details Patient Name: John [name]. Date of Service: 04/30/2015 1:30 PM Medical Record Number: [id number] Patient Account Number: [id number] Date of Birth/Sex: 05/01/1922 (82 y.o. Male) Treating RN: Afful, RN, BSN, [place] Sink Primary Care Physician: Rodrigo Ran Other Clinician: Referring Physician: Rodrigo Ran Treating Physician/Extender: Rudene Re in Treatment: 0 History of Present Illness Location: left toe ulcer which she's had for about 18 months and he has had a injury to his left forearm a day ago. Quality: Patient reports No Pain. Severity: Patient states wound are getting worse. Duration: Patient has had the wound for > 18 months prior to seeking treatment at the wound center Context: The wound appeared gradually over time Modifying Factors: Other treatment(s) tried include:has been seeing a podiatrist for about 18 months and is also seen a vascular surgeon a while ago Associated Signs and Symptoms: Patient reports having increase swelling. HPI  Description: 82 year old patient with past medical history of hypertension, hyperlipidemia, diabetes mellitus, diabetic retinopathy, dementia, peripheral vascular disease, diabetic ulcer left foot. The patient has a pacemaker. His cardiac issues include ventricular tachycardia, atrial fibrillation, ischemic cardiomyopathy, pacemaker, complete heart block. He has never been a smoker. Last hemoglobin A1c was 5.8 in March 2017. The patient has been seen by Dr. 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 At that stage conservative therapy was recommended but note was made of the fact that the wound progresses a toe amputation may not have adequate circulation to heal. No recent x-rays have been done by his podiatrist Electronic Signature(s) Signed: 04/30/2015 4:50:13 PM By: Evlyn Kanner MD, FACS Previous Signature: 04/30/2015 2:09:51 PM Version By: Evlyn Kanner MD, FACS Entered By: Evlyn Kanner on 04/30/2015 16:50:12 John [name] ([id number]) -------------------------------------------------------------------------------- Physical Exam Details Patient Name: John [name]. Date of Service: 04/30/2015 1:30 PM Medical Record Number: [id number] Patient Account Number: [id number] Date of Birth/Sex: Nov 02, 1922 (82 y.o. Male) Treating RN: Clover Mealy, RN, BSN, [place] Sink Primary Care Physician: Rodrigo Ran Other Clinician: Referring Physician: Rodrigo Ran Treating Physician/Extender: Rudene Re in Treatment: 0 Constitutional . Pulse regular. Respirations normal and unlabored. Afebrile. . Eyes Nonicteric. Reactive to light. Ears, Nose, Mouth, and Throat Lips, teeth, and gums WNL.Marland Kitchen Moist mucosa without lesions. Neck supple and nontender. No palpable supraclavicular or cervical adenopathy. Normal sized without goiter. Respiratory WNL. No  retractions.. Cardiovascular Pedal Pulses WNL. today on the left was 0.71. No clubbing, cyanosis or edema. Gastrointestinal (GI) Abdomen without masses or tenderness.. No liver or spleen enlargement or tenderness.. Lymphatic No adneopathy. No adenopathy. No adenopathy. Musculoskeletal Adexa without tenderness or enlargement.. Digits and nails w/o clubbing, cyanosis, infection, petechiae, ischemia, or inflammatory  conditions.. Integumentary (Hair, Skin) No suspicious lesions. No crepitus or fluctuance. No peri-wound warmth or erythema. No masses.Marland Kitchen Psychiatric Judgement and insight Intact.. No evidence of depression, anxiety, or agitation.. Notes on the left forearm he has a large skin flap which has been of the lateral part of his forearm and after washing this out well with saline antibiotic the flap back in place and will do an appropriate dressing. No debridement was required. The left second toe on the dorsum has a ulcerated area which may be fairly deep considering does not but subacute interstitial and may be down to the tendon. No debridement was done today. Electronic Signature(s) Signed: 04/30/2015 4:51:19 PM By: Evlyn Kanner MD, FACS Entered By: Evlyn Kanner on 04/30/2015 16:51:19 John [name] ([id number]) -------------------------------------------------------------------------------- Physician Orders Details Patient Name: John [name]. Date of Service: 04/30/2015 1:30 PM Medical Record Number: [id number] Patient Account Number: [id number] Date of Birth/Sex: 1922/09/16 (82 y.o. Male) Treating RN: Afful, RN, BSN, [place] Sink Primary Care Physician: Rodrigo Ran Other Clinician: Referring Physician: Rodrigo Ran Treating Physician/Extender: Rudene Re in Treatment: 0 Verbal / Phone Orders: Yes Clinician: Afful, RN, BSN, Rita Read Back and Verified: Yes Diagnosis Coding Wound Cleansing Wound #2 Left,Dorsal Toe Second o Cleanse wound with mild soap and  water o May Shower, gently pat wound dry prior to applying new dressing. o No tub bath. Anesthetic Wound #1 Left Forearm o Topical Lidocaine 4% cream applied to wound bed prior to debridement Wound #2 Left,Dorsal Toe Second o Topical Lidocaine 4% cream applied to wound bed prior to debridement Wound #3 Right,Dorsal Toe Great o Topical Lidocaine 4% cream applied to wound bed prior to debridement Primary Wound Dressing Wound #1 Left Forearm o Foam Wound #2 Left,Dorsal Toe Second o Aquacel Ag Secondary Dressing Wound #1 Left Forearm o Gauze and Kerlix/Conform Wound #2 Left,Dorsal Toe Second o Gauze and Kerlix/Conform Dressing Change Frequency Wound #1 Left Forearm o Change dressing every week Wound #2 Left,Dorsal Toe Second o Change dressing every other day. John [name], John [name] ([id number]) Follow-up Appointments Wound #1 Left Forearm o Return Appointment in 1 week. Wound #2 Left,Dorsal Toe Second o Return Appointment in 1 week. Radiology o X-ray, foot - AP lateral Notes Get notes from Dr. Ilda Mori vein and vascular Electronic Signature(s) Signed: 04/30/2015 4:08:52 PM By: Elpidio Eric BSN, RN Signed: 04/30/2015 4:56:07 PM By: Evlyn Kanner MD, FACS Entered By: Elpidio Eric on 04/30/2015 14:57:52 John [name] ([id number]) -------------------------------------------------------------------------------- Problem List Details Patient Name: KEYGAN, DUMOND. Date of Service: 04/30/2015 1:30 PM Medical Record Number: [id number] Patient Account Number: [id number] Date of Birth/Sex: 1922/05/15 (82 y.o. Male) Treating RN: Afful, RN, BSN, [place] Sink Primary Care Physician: Rodrigo Ran Other Clinician: Referring Physician: Rodrigo Ran Treating Physician/Extender: Rudene Re in Treatment: 0 Active Problems ICD-10 Encounter Code Description Active Date Diagnosis E11.621 Type 2 diabetes mellitus with foot ulcer 04/30/2015 Yes S41.112A  Laceration without foreign body of left upper arm, initial 04/30/2015 Yes encounter L97.522 Non-pressure chronic ulcer of other part of left foot with fat 04/30/2015 Yes layer exposed I70.245 Atherosclerosis of native arteries of left leg with ulceration 04/30/2015 Yes of other part of foot Inactive Problems Resolved Problems Electronic Signature(s) Signed: 04/30/2015 4:47:36 PM By: Evlyn Kanner MD, FACS Entered By: Evlyn Kanner on 04/30/2015 16:47:36 John [name] ([id number]) -------------------------------------------------------------------------------- Progress Note Details Patient Name: John [name]. Date of Service: 04/30/2015 1:30 PM Medical Record Number: [id number] Patient Account Number: [id number] Date of Birth/Sex: 06-11-1922 (82 y.o. Male) Treating RN: Afful,  RN, BSN, [place] Sink Primary Care Physician: Rodrigo Ran Other Clinician: Referring Physician: Rodrigo Ran Treating Physician/Extender: Rudene Re in Treatment: 0 Subjective Chief Complaint Information obtained from Patient Patients presents for treatment of an open diabetic ulcer which she's had on his left toe on the dorsum for about 18 months and recently had a injury to his left forearm a day ago History of Present Illness (HPI) The following HPI elements were documented for the patient's wound: Location: left toe ulcer which she's had for about 18 months and he has had a injury to his left forearm a day ago. Quality: Patient reports No Pain. Severity: Patient states wound are getting worse. Duration: Patient has had the wound for > 18 months prior to seeking treatment at the wound center Context: The wound appeared gradually over time Modifying Factors: Other treatment(s) tried include:has been seeing a podiatrist for about 18 months and is also seen a vascular surgeon a while ago Associated Signs and Symptoms: Patient reports having increase swelling. 82 year old patient with past medical history  of hypertension, hyperlipidemia, diabetes mellitus, diabetic retinopathy, dementia, peripheral vascular disease, diabetic ulcer left foot. The patient has a pacemaker. His cardiac issues include ventricular tachycardia, atrial fibrillation, ischemic cardiomyopathy, pacemaker, complete heart block. He has never been a smoker. Last hemoglobin A1c was 5.8 in March 2017. The patient has been seen by Dr. 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 At that stage conservative therapy was recommended but note was made of the fact that the wound progresses a toe amputation may not have adequate circulation to heal. No recent x-rays have been done by his podiatrist Wound History Patient presents with 2 open wounds that have been present for approximately 53months. Patient has been treating wounds in the following manner: iodogel. Laboratory tests have not been performed in the last month. Patient reportedly has not tested positive for an antibiotic resistant organism. Patient reportedly has not tested positive for osteomyelitis. Patient reportedly has not had testing performed to evaluate circulation in the legs. Patient experiences the following problems associated with their wounds: infection, swelling. Patient History Information obtained from Patient, Caregiver. John [name], John [name] ([id number]) Allergies no known allergies Family History Diabetes - Siblings, Mother, Hypertension - Father, Paternal Grandparents, No family history of Cancer, Heart Disease, Hereditary Spherocytosis, Kidney Disease, Lung Disease, Seizures, Stroke, Thyroid Problems, Tuberculosis. Social History Never smoker, Marital Status - Married, Alcohol Use - Rarely, Drug Use - No History, Caffeine Use - Moderate. Medical History Eyes Denies history of Cataracts, Glaucoma, Optic  Neuritis Ear/Nose/Mouth/Throat Denies history of Chronic sinus problems/congestion Hematologic/Lymphatic Patient has history of Anemia Denies history of Hemophilia, Human Immunodeficiency Virus, Lymphedema, Sickle Cell Disease Respiratory Denies history of Aspiration, Asthma, Chronic Obstructive Pulmonary Disease (COPD), Pneumothorax, Sleep Apnea, Tuberculosis Cardiovascular Patient has history of Arrhythmia, Coronary Artery Disease, Hypertension, Myocardial Infarction Gastrointestinal Denies history of Cirrhosis , Colitis, Crohn s, Hepatitis A, Hepatitis B, Hepatitis C Endocrine Patient has history of Type II Diabetes Genitourinary Denies history of End Stage Renal Disease Immunological Denies history of Lupus Erythematosus, Raynaud s, Scleroderma Integumentary (Skin) Denies history of History of Burn, History of pressure wounds Musculoskeletal Denies history of Gout, Rheumatoid Arthritis, Osteoarthritis, Osteomyelitis Neurologic Patient has history of Dementia - vascular Denies history of Neuropathy, Quadriplegia, Paraplegia, Seizure Disorder Oncologic Denies history of Received Chemotherapy, Received Radiation Psychiatric Denies history of Anorexia/bulimia, Confinement  Anxiety Medical And Surgical History Notes Ear/Nose/Mouth/Throat hard of hearing Cardiovascular John [name], SHEIN. ([id number]) pacemaker-[employer] Review of Systems (ROS) Eyes Complains or has symptoms of Glasses / Contacts. Ear/Nose/Mouth/Throat The patient has no complaints or symptoms. Hematologic/Lymphatic Complains or has symptoms of Bleeding / Clotting Disorders. Respiratory The patient has no complaints or symptoms. Cardiovascular Complains or has symptoms of LE edema. Gastrointestinal The patient has no complaints or symptoms. Endocrine The patient has no complaints or symptoms. Genitourinary The patient has no complaints or symptoms. Immunological The patient has no complaints or  symptoms. Integumentary (Skin) Complains or has symptoms of Wounds. Denies complaints or symptoms of Bleeding or bruising tendency, Breakdown, Swelling. Musculoskeletal The patient has no complaints or symptoms. Neurologic The patient has no complaints or symptoms. Oncologic The patient has no complaints or symptoms. Psychiatric The patient has no complaints or symptoms. Medications vitamin B12 oral tablet oral daily carvedilol 6.25 mg tablet oral 1 1 tablet oral two times daily Namenda XR 28 mg capsule sprinkle,extended release oral 1 1 capsule,sprinkle,ER 24hr oral daily amiodarone 200 mg tablet oral 1 1 tablet oral daily metformin 500 mg tablet oral one half tablet oral (250 mg.) daily pravastatin 40 mg tablet oral 1 1 tablet oral daily Exelon Patch 9.5 mg/24 hr transdermal transdermal 1 1 patch 24 hour transdermal daily aspirin 81 mg tablet,delayed release oral 1 1 tablet,delayed release (DR/EC) oral daily triamterene 37.5 mg-hydrochlorothiazide 25 mg tablet oral one half tablet oral daily Vitamin D2 50,000 unit capsule oral 1 1 capsule oral weekly John [name], John E. ([id number]) Objective Constitutional Pulse regular. Respirations normal and unlabored. Afebrile. Vitals Time Taken: 2:00 PM, Height: 72 in, Source: Stated, Weight: 239 lbs, Source: Measured, BMI: 32.4, Temperature: 97.8 F, Pulse: 69 bpm, Respiratory Rate: 18 breaths/min, Blood Pressure: 138/48 mmHg. Eyes Nonicteric. Reactive to light. Ears, Nose, Mouth, and Throat Lips, teeth, and gums WNL.Marland Kitchen Moist mucosa without lesions. Neck supple and nontender. No palpable supraclavicular or cervical adenopathy. Normal sized without goiter. Respiratory WNL. No retractions.. Cardiovascular Pedal Pulses WNL. today on the left was 0.71. No clubbing, cyanosis or edema. Gastrointestinal (GI) Abdomen without masses or tenderness.. No liver or spleen enlargement or tenderness.. Lymphatic No adneopathy. No adenopathy. No  adenopathy. Musculoskeletal Adexa without tenderness or enlargement.. Digits and nails w/o clubbing, cyanosis, infection, petechiae, ischemia, or inflammatory conditions.Marland Kitchen Psychiatric Judgement and insight Intact.. No evidence of depression, anxiety, or agitation.. General Notes: on the left forearm he has a large skin flap which has been of the lateral part of his forearm and after washing this out well with saline antibiotic the flap back in place and will do an appropriate dressing. No debridement was required. The left second toe on the dorsum has a ulcerated area which may be fairly deep considering does not but subacute interstitial and may be down to the tendon. No debridement was done today. Integumentary (Hair, Skin) No suspicious lesions. No crepitus or fluctuance. No peri-wound warmth or erythema. No masses.Marland Kitchen John [name], John [name] ([id number]) Wound #1 status is Open. Original cause of wound was Trauma. The wound is located on the Left Forearm. The wound measures 4cm length x 1cm width x 0.1cm depth; 3.142cm^2 area and 0.314cm^3 volume. The wound is limited to skin breakdown. There is no tunneling or undermining noted. There is a large amount of serosanguineous drainage noted. The wound margin is distinct with the outline attached to the wound base. There is large (67-100%) red granulation within the wound bed. There is no necrotic tissue within the  wound bed. The periwound skin appearance exhibited: Erythema. The surrounding wound skin color is noted with erythema which is circumferential. Periwound temperature was noted as No Abnormality. The periwound has tenderness on palpation. Wound #2 status is Open. Original cause of wound was Gradually Appeared. The wound is located on the Left,Dorsal Toe Second. The wound measures 0.5cm length x 0.5cm width x 0.1cm depth; 0.196cm^2 area and 0.02cm^3 volume. The wound is limited to skin breakdown. There is no tunneling or undermining  noted. There is a medium amount of serosanguineous drainage noted. The wound margin is distinct with the outline attached to the wound base. There is large (67-100%) pink, pale granulation within the wound bed. There is a small (1-33%) amount of necrotic tissue within the wound bed including Adherent Slough. The periwound skin appearance exhibited: Moist. The periwound skin appearance did not exhibit: Callus, Crepitus, Excoriation, Fluctuance, Friable, Induration, Localized Edema, Rash, Scarring, Dry/Scaly, Maceration, Atrophie Blanche, Cyanosis, Ecchymosis, Hemosiderin Staining, Mottled, Pallor, Rubor, Erythema. Periwound temperature was noted as No Abnormality. The periwound has tenderness on palpation. Wound #3 status is Healed - Epithelialized. Original cause of wound was Gradually Appeared. The wound is located on the Right,Dorsal [place]. The wound measures 0cm length x 0cm width x 0cm depth; 0cm^2 area and 0cm^3 volume. Assessment Active Problems ICD-10 E11.621 - Type 2 diabetes mellitus with foot ulcer S41.112A - Laceration without foreign body of left upper arm, initial encounter L97.522 - Non-pressure chronic ulcer of other part of left foot with fat layer exposed I70.245 - Atherosclerosis of native arteries of left leg with ulceration of other part of foot This 82 year old gentleman who has diabetes mellitus which is well controlled and significant atherosclerotic disease of his left lower extremity blood vessels now has a nonhealing wound on the left toe which she's had for over 18 months. the acute injury on his left forearm has been treated with appropriate foam dressing and Kerlix and Coban to be left in place for a week. John [name], John [name] ([id number]) As far as his right foot goes I have recommended: 1. Local dressing with Aquacel Ag and a foam cover 2. X-ray of the left foot 3. Review by his vascular surgeon Dr. Waverly Ferrari 4. Regular visits at the wound center  for closely monitoring this situation which has been chronic for over 18 months the family has had all questions answered and will be compliant Plan Wound Cleansing: Wound #2 Left,Dorsal Toe Second: Cleanse wound with mild soap and water May Shower, gently pat wound dry prior to applying new dressing. No tub bath. Anesthetic: Wound #1 Left Forearm: Topical Lidocaine 4% cream applied to wound bed prior to debridement Wound #2 Left,Dorsal Toe Second: Topical Lidocaine 4% cream applied to wound bed prior to debridement Wound #3 Right,Dorsal Toe Great: Topical Lidocaine 4% cream applied to wound bed prior to debridement Primary Wound Dressing: Wound #1 Left Forearm: Foam Wound #2 Left,Dorsal Toe Second: Aquacel Ag Secondary Dressing: Wound #1 Left Forearm: Gauze and Kerlix/Conform Wound #2 Left,Dorsal Toe Second: Gauze and Kerlix/Conform Dressing Change Frequency: Wound #1 Left Forearm: Change dressing every week Wound #2 Left,Dorsal Toe Second: Change dressing every other day. Follow-up Appointments: Wound #1 Left Forearm: Return Appointment in 1 week. Wound #2 Left,Dorsal Toe Second: Return Appointment in 1 week. Radiology ordered were: X-ray, foot - AP lateral General Notes: Get notes from Dr. Ilda Mori vein and vascular John [name], John [name] ([id number]) This 82 year old gentleman who has diabetes mellitus which is well controlled and significant atherosclerotic  disease of his left lower extremity blood vessels now has a nonhealing wound on the left toe which she's had for over 18 months. the acute injury on his left forearm has been treated with appropriate foam dressing and Kerlix and Coban to be left in place for a week. As far as his right foot goes I have recommended: 1. Local dressing with Aquacel Ag and a foam cover 2. X-ray of the left foot 3. Review by his vascular surgeon Dr. Waverly Ferrari 4. Regular visits at the wound center for closely monitoring  this situation which has been chronic for over 18 months the family has had all questions answered and will be compliant Electronic Signature(s) Signed: 04/30/2015 4:54:22 PM By: Evlyn Kanner MD, FACS Entered By: Evlyn Kanner on 04/30/2015 16:54:22 John [name] ([id number]) -------------------------------------------------------------------------------- ROS/PFSH Details Patient Name: John [name]. Date of Service: 04/30/2015 1:30 PM Medical Record Number: [id number] Patient Account Number: [id number] Date of Birth/Sex: December 10, 1922 (82 y.o. Male) Treating RN: Afful, RN, BSN, [place] Sink Primary Care Physician: Rodrigo Ran Other Clinician: Referring Physician: Rodrigo Ran Treating Physician/Extender: Rudene Re in Treatment: 0 Information Obtained From Patient Caregiver Wound History Do you currently have one or more open woundso Yes How many open wounds do you currently haveo 2 Approximately how long have you had your woundso 18months How have you been treating your wound(s) until nowo iodogel Has your wound(s) ever healed and then re-openedo No Have you had any lab work done in the past montho No Have you tested positive for an antibiotic resistant organism (MRSA, VRE)o No Have you tested positive for osteomyelitis (bone infection)o No Have you had any tests for circulation on your legso No Have you had other problems associated with your woundso Infection, Swelling Eyes Complaints and Symptoms: Positive for: Glasses / Contacts Medical History: Negative for: Cataracts; Glaucoma; Optic Neuritis Hematologic/Lymphatic Complaints and Symptoms: Positive for: Bleeding / Clotting Disorders Medical History: Positive for: Anemia Negative for: Hemophilia; Human Immunodeficiency Virus; Lymphedema; Sickle Cell Disease Cardiovascular Complaints and Symptoms: Positive for: LE edema Medical History: Positive for: Arrhythmia; Coronary Artery Disease; Hypertension; Myocardial  Infarction Past Medical History Notes: pacemaker-[employer] John [name], John [name] ([id number]) Integumentary (Skin) Complaints and Symptoms: Positive for: Wounds Negative for: Bleeding or bruising tendency; Breakdown; Swelling Medical History: Negative for: History of Burn; History of pressure wounds Ear/Nose/Mouth/Throat Complaints and Symptoms: No Complaints or Symptoms Medical History: Negative for: Chronic sinus problems/congestion Past Medical History Notes: hard of hearing Respiratory Complaints and Symptoms: No Complaints or Symptoms Medical History: Negative for: Aspiration; Asthma; Chronic Obstructive Pulmonary Disease (COPD); Pneumothorax; Sleep Apnea; Tuberculosis Gastrointestinal Complaints and Symptoms: No Complaints or Symptoms Medical History: Negative for: Cirrhosis ; Colitis; Crohnos; Hepatitis A; Hepatitis B; Hepatitis C Endocrine Complaints and Symptoms: No Complaints or Symptoms Medical History: Positive for: Type II Diabetes Genitourinary Complaints and Symptoms: No Complaints or Symptoms Medical History: Negative for: End Stage Renal Disease John [name], John E. ([id number]) Immunological Complaints and Symptoms: No Complaints or Symptoms Medical History: Negative for: Lupus Erythematosus; Raynaudos; Scleroderma Musculoskeletal Complaints and Symptoms: No Complaints or Symptoms Medical History: Negative for: Gout; Rheumatoid Arthritis; Osteoarthritis; Osteomyelitis Neurologic Complaints and Symptoms: No Complaints or Symptoms Medical History: Positive for: Dementia - vascular Negative for: Neuropathy; Quadriplegia; Paraplegia; Seizure Disorder Oncologic Complaints and Symptoms: No Complaints or Symptoms Medical History: Negative for: Received Chemotherapy; Received Radiation Psychiatric Complaints and Symptoms: No Complaints or Symptoms Medical History: Negative for: Anorexia/bulimia; Confinement Anxiety Family and Social  History Cancer: No; Diabetes: Yes - Siblings,  Mother; Heart Disease: No; Hereditary Spherocytosis: No; Hypertension: Yes - Father, Paternal Grandparents; Kidney Disease: No; Lung Disease: No; Seizures: No; Stroke: No; Thyroid Problems: No; Tuberculosis: No; Never smoker; Marital Status - Married; Alcohol Use: Rarely; Drug Use: No History; Caffeine Use: Moderate; Financial Concerns: No; Food, Clothing or Shelter Needs: No; Support System Lacking: No; Transportation Concerns: No; Advanced Directives: Yes; Living Will: Yes; Medical Power of Attorney: Yes - Ethel Parrella Physician Affirmation I have reviewed and agree with the above information. John [name], John [name] ([id number]) Electronic Signature(s) Signed: 04/30/2015 4:08:52 PM By: Elpidio Eric BSN, RN Signed: 04/30/2015 4:56:07 PM By: Evlyn Kanner MD, FACS Previous Signature: 04/30/2015 1:54:46 PM Version By: Evlyn Kanner MD, FACS Previous Signature: 04/30/2015 1:00:34 PM Version By: Elpidio Eric BSN, RN Entered By: Elpidio Eric on 04/30/2015 14:04:35 John [name] ([id number]) -------------------------------------------------------------------------------- SuperBill Details Patient Name: John Snow E. Date of Service: 04/30/2015 Medical Record Number: [id number] Patient Account Number: [id number] Date of Birth/Sex: June 28, 1922 (82 y.o. 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 Facility Procedures CPT4 Code: [id number] Description: 99213 - WOUND  CARE VISIT-LEV 3 EST PT Modifier: Quantity: 1 Physician Procedures CPT4: Description Modifier Quantity Code 3244010 [id number] - WC PHYS LEVEL 4 - NEW PT 1 ICD-10 Description Diagnosis E11.621 Type 2 diabetes mellitus with foot ulcer S41.112A Laceration without foreign body of left upper arm, initial encounter L97.522  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

## 2015-05-04 ENCOUNTER — Encounter: Payer: Self-pay | Admitting: Vascular Surgery

## 2015-05-07 ENCOUNTER — Encounter: Payer: Medicare Other | Admitting: Surgery

## 2015-05-07 ENCOUNTER — Ambulatory Visit (INDEPENDENT_AMBULATORY_CARE_PROVIDER_SITE_OTHER): Payer: Medicare Other | Admitting: Vascular Surgery

## 2015-05-07 ENCOUNTER — Encounter: Payer: Self-pay | Admitting: Vascular Surgery

## 2015-05-07 VITALS — BP 122/70 | HR 68 | Temp 97.2°F | Resp 16 | Ht 70.0 in | Wt 242.0 lb

## 2015-05-07 DIAGNOSIS — I70245 Atherosclerosis of native arteries of left leg with ulceration of other part of foot: Secondary | ICD-10-CM | POA: Diagnosis not present

## 2015-05-07 DIAGNOSIS — E11621 Type 2 diabetes mellitus with foot ulcer: Secondary | ICD-10-CM | POA: Diagnosis not present

## 2015-05-07 MED ORDER — CEPHALEXIN 500 MG PO CAPS: 500.0000 mg | ORAL_CAPSULE | Freq: Three times a day (TID) | ORAL | Status: DC

## 2015-05-07 NOTE — Progress Notes (Addendum)
JUNIOUS, [name] ([id number]) Visit Report for 05/07/2015 Arrival Information Details Patient Name: John [name], John [name]. Date of Service: 05/07/2015 10:45 AM Medical Record Number: [id number] Patient Account Number: [id number] Date of Birth/Sex: 06-Dec-1922 (82 y.o. Male) Treating RN: Afful, RN, BSN, [place] Sink Primary Care Physician: Rodrigo Ran Other Clinician: Referring Physician: Rodrigo Ran Treating Physician/Extender: Rudene Re in Treatment: 1 Visit Information History Since Last Visit Added or deleted any medications: No Patient Arrived: Walker Any new allergies or adverse reactions: No Arrival Time: 10:45 Had a fall or experienced change in No Accompanied By: family activities of daily living that may affect Transfer Assistance: None risk of falls: Patient Identification Verified: Yes Signs or symptoms of abuse/neglect since last No Secondary Verification Process Yes visito Completed: Hospitalized since last visit: No Patient Requires Transmission- No Has Dressing in Place as Prescribed: Yes Based Precautions: Pain Present Now: No Patient Has Alerts: Yes Patient Alerts: Patient on Blood Thinner Electronic Signature(s) Signed: 05/07/2015 10:45:59 AM By: Elpidio Eric BSN, RN Entered By: Elpidio Eric on 05/07/2015 10:45:59 John [name] ([id number]) -------------------------------------------------------------------------------- Encounter Discharge Information Details Patient Name: John [name]. Date of Service: 05/07/2015 10:45 AM Medical Record Number: [id number] Patient Account Number: [id number] Date of Birth/Sex: 1922-05-25 (82 y.o. Male) Treating RN: Afful, RN, BSN, [place] Sink Primary Care Physician: Rodrigo Ran Other Clinician: Referring Physician: Rodrigo Ran Treating Physician/Extender: Rudene Re in Treatment: 1 Encounter Discharge Information Items Discharge Pain Level: 0 Discharge Condition: Stable Ambulatory Status: Walker Discharge  Destination: Home Transportation: Private Auto Accompanied By: wife Schedule Follow-up Appointment: No Medication Reconciliation completed No and provided to Patient/Care Anastasiya Gowin: Provided on Clinical Summary of Care: 05/07/2015 Form Type Recipient Paper Patient RR Electronic Signature(s) Signed: 05/07/2015 11:33:42 AM By: Elpidio Eric BSN, RN Previous Signature: 05/07/2015 11:21:59 AM Version By: Gwenlyn Perking Entered By: Elpidio Eric on 05/07/2015 11:33:41 John [name] ([id number]) -------------------------------------------------------------------------------- Lower Extremity Assessment Details Patient Name: John [name]. Date of Service: 05/07/2015 10:45 AM Medical Record Number: [id number] Patient Account Number: [id number] Date of Birth/Sex: [birth year] (82 y.o. Male) Treating RN: Afful, RN, BSN, [place] Sink Primary Care Physician: Rodrigo Ran Other Clinician: Referring Physician: Rodrigo Ran Treating Physician/Extender: Rudene Re in Treatment: 1 Vascular Assessment Pulses: Posterior Tibial Dorsalis Pedis Palpable: [Left:Yes] Extremity colors, hair growth, and conditions: Extremity Color: [Left:Normal] Hair Growth on Extremity: [Left:No] Temperature of Extremity: [Left:Warm] Capillary Refill: [Left:< 3 seconds] Toe Nail Assessment Left: Right: Thick: No Discolored: Yes Deformed: No Improper Length and Hygiene: No Electronic Signature(s) Signed: 05/07/2015 10:47:00 AM By: Elpidio Eric BSN, RN Entered By: Elpidio Eric on 05/07/2015 10:47:00 John [name] ([id number]) -------------------------------------------------------------------------------- Multi Wound Chart Details Patient Name: John [name]. Date of Service: 05/07/2015 10:45 AM Medical Record Number: [id number] Patient Account Number: [id number] Date of Birth/Sex: [birth year] (82 y.o. Male) Treating RN: Clover Mealy, RN, BSN, [place] Sink Primary Care Physician: Rodrigo Ran Other Clinician: Referring  Physician: Rodrigo Ran Treating Physician/Extender: Rudene Re in Treatment: 1 Vital Signs Height(in): 72 Pulse(bpm): 68 Weight(lbs): 239 Blood Pressure 121/53 (mmHg): Body Mass Index(BMI): 32 Temperature(F): 98.2 Respiratory Rate 18 (breaths/min): Photos: [1:No Photos] [2:No Photos] [N/A:N/A] Wound Location: [1:Left Forearm] [2:Left Toe Second - Dorsal] [N/A:N/A] Wounding Event: [1:Trauma] [2:Gradually Appeared] [N/A:N/A] Primary Etiology: [1:Trauma, Other] [2:Diabetic Wound/Ulcer of the Lower Extremity] [N/A:N/A] Comorbid History: [1:Anemia, Arrhythmia, Coronary Artery Disease, Coronary Artery Disease, Hypertension, Myocardial Hypertension, Myocardial Infarction, Type II Diabetes, Dementia] [2:Anemia, Arrhythmia, Infarction, Type II Diabetes, Dementia] [N/A:N/A] Date Acquired: [1:04/29/2015] [2:01/12/2015] [N/A:N/A] Weeks of Treatment: [1:1] [2:1] [N/A:N/A] Wound Status: [1:Open] [2:Open] [  N/A:N/A] Measurements L x W x D 1x0.4x0.1 [2:0.4x0.5x0.1] [N/A:N/A] (cm) Area (cm) : [1:0.314] [2:0.157] [N/A:N/A] Volume (cm) : [1:0.031] [2:0.016] [N/A:N/A] % Reduction in Area: [1:90.00%] [2:19.90%] [N/A:N/A] % Reduction in Volume: 90.10% [2:20.00%] [N/A:N/A] Classification: [1:Full Thickness Without Exposed Support Structures] [2:Grade 1] [N/A:N/A] Exudate Amount: [1:Small] [2:Medium] [N/A:N/A] Exudate Type: [1:Serosanguineous] [2:Serosanguineous] [N/A:N/A] Exudate Color: [1:red, brown] [2:red, brown] [N/A:N/A] Wound Margin: [1:Distinct, outline attached Distinct, outline attached] [N/A:N/A] Granulation Amount: [1:Large (67-100%)] [2:Large (67-100%)] [N/A:N/A] Granulation Quality: [1:Red] [2:Pink, Pale] [N/A:N/A] Necrotic Amount: [1:None Present (0%)] [2:Small (1-33%)] [N/A:N/A] Exposed Structures: [N/A:N/A] Fascia: No Fascia: No Fat: No Fat: No Tendon: No Tendon: No Muscle: No Muscle: No Joint: No Joint: No Bone: No Bone: No Limited to Skin Limited to  Skin Breakdown Breakdown Epithelialization: Large (67-100%) None N/A Periwound Skin Texture: No Abnormalities Noted Edema: No N/A Excoriation: No Induration: No Callus: No Crepitus: No Fluctuance: No Friable: No Rash: No Scarring: No Periwound Skin Moist: Yes Moist: Yes N/A Moisture: Maceration: No Dry/Scaly: No Periwound Skin Color: Erythema: No Atrophie Blanche: No N/A Cyanosis: No Ecchymosis: No Erythema: No Hemosiderin Staining: No Mottled: No Pallor: No Rubor: No Temperature: No Abnormality No Abnormality N/A Tenderness on Yes Yes N/A Palpation: Wound Preparation: Ulcer Cleansing: Ulcer Cleansing: N/A Rinsed/Irrigated with Rinsed/Irrigated with Saline Saline Topical Anesthetic Topical Anesthetic Applied: Other: lidocaine Applied: Other: lidocaine 4% 4% Treatment Notes Electronic Signature(s) Signed: 05/07/2015 11:02:34 AM By: Elpidio Eric BSN, RN Entered By: Elpidio Eric on 05/07/2015 11:02:34 John [name] ([id number]) -------------------------------------------------------------------------------- Multi-Disciplinary Care Plan Details Patient Name: John [name]. Date of Service: 05/07/2015 10:45 AM Medical Record Number: [id number] Patient Account Number: [id number] Date of Birth/Sex: 1922-12-11 (82 y.o. Male) Treating RN: Afful, RN, BSN, [place] Sink Primary Care Physician: Rodrigo Ran Other Clinician: Referring Physician: Rodrigo Ran Treating Physician/Extender: Rudene Re in Treatment: 1 Active Inactive Electronic Signature(s) Signed: 05/13/2015 5:56:58 PM By: Elpidio Eric BSN, RN Signed: 06/03/2015 4:56:07 PM By: Elliot Gurney RN, BSN, Kim RN, BSN Previous Signature: 05/07/2015 11:02:18 AM Version By: Elpidio Eric BSN, RN Entered By: Elliot Gurney, RN, BSN, Kim on 05/13/2015 17:09:56 John [name] ([id number]) -------------------------------------------------------------------------------- Patient/Caregiver Education Details Patient Name: MIR, FULLILOVE. Date of Service: 05/07/2015 10:45 AM Medical Record Number: [id number] Patient Account Number: [id number] Date of Birth/Gender: [birth year] (82 y.o. Male) Treating RN: Afful, RN, BSN, [place] Sink Primary Care Physician: Rodrigo Ran Other Clinician: Referring Physician: Rodrigo Ran Treating Physician/Extender: Rudene Re in Treatment: 1 Education Assessment Education Provided To: Caregiver Education Topics Provided [facility]: Methods: Explain/Verbal Responses: State content correctly Wound/Skin Impairment: Methods: Explain/Verbal Responses: State content correctly Electronic Signature(s) Signed: 05/07/2015 11:33:55 AM By: Elpidio Eric BSN, RN Entered By: Elpidio Eric on 05/07/2015 11:33:55 John [name] ([id number]) -------------------------------------------------------------------------------- Wound Assessment Details Patient Name: Lang Snow E. Date of Service: 05/07/2015 10:45 AM Medical Record Number: [id number] Patient Account Number: [id number] Date of Birth/Sex: 1922-02-12 (82 y.o. Male) Treating RN: Afful, RN, BSN, [place] Sink Primary Care Physician: Rodrigo Ran Other Clinician: Referring Physician: Rodrigo Ran Treating Physician/Extender: Rudene Re in Treatment: 1 Wound Status Wound Number: 1 Primary Trauma, Other Etiology: Wound Location: Left Forearm Wound Open Wounding Event: Trauma Status: Date Acquired: 04/29/2015 Comorbid Anemia, Arrhythmia, Coronary Artery Weeks Of Treatment: 1 History: Disease, Hypertension, Myocardial Clustered Wound: No Infarction, Type II Diabetes, Dementia Photos Photo Uploaded By: Elpidio Eric on 05/07/2015 16:46:06 Wound Measurements Length: (cm) 1 Width: (cm) 0.4 Depth: (cm) 0.1 Area: (cm) 0.314 Volume: (cm) 0.031 % Reduction in Area: 90% % Reduction in  Volume: 90.1% Epithelialization: Large (67-100%) Tunneling: No Undermining: No Wound Description Full Thickness Without  Exposed Classification: Support Structures Wound Margin: Distinct, outline attached Exudate Small Amount: Exudate Type: Serosanguineous Exudate Color: red, brown Foul Odor After Cleansing: No Wound Bed Granulation Amount: Large (67-100%) Exposed Structure Granulation Quality: Red Fascia Exposed: No Tarte, Merwyn E. ([id number]) Necrotic Amount: None Present (0%) Fat Layer Exposed: No Tendon Exposed: No Muscle Exposed: No Joint Exposed: No Bone Exposed: No Limited to Skin Breakdown Periwound Skin Texture Texture Color No Abnormalities Noted: No No Abnormalities Noted: No Erythema: No Moisture No Abnormalities Noted: No Temperature / Pain Moist: Yes Temperature: No Abnormality Tenderness on Palpation: Yes Wound Preparation Ulcer Cleansing: Rinsed/Irrigated with Saline Topical Anesthetic Applied: Other: lidocaine 4%, Electronic Signature(s) Signed: 05/07/2015 4:56:55 PM By: Elpidio Eric BSN, RN Entered By: Elpidio Eric on 05/07/2015 11:00:56 John [name] ([id number]) -------------------------------------------------------------------------------- Wound Assessment Details Patient Name: Lang Snow E. Date of Service: 05/07/2015 10:45 AM Medical Record Number: [id number] Patient Account Number: [id number] Date of Birth/Sex: 20-Dec-1922 (82 y.o. Male) Treating RN: Afful, RN, BSN, [place] Sink Primary Care Physician: Rodrigo Ran Other Clinician: Referring Physician: Rodrigo Ran Treating Physician/Extender: Rudene Re in Treatment: 1 Wound Status Wound Number: 2 Primary Diabetic Wound/Ulcer of the Lower Etiology: Extremity Wound Location: Left Toe Second - Dorsal Wound Open Wounding Event: Gradually Appeared Status: Date Acquired: 01/12/2015 Comorbid Anemia, Arrhythmia, Coronary Artery Weeks Of Treatment: 1 History: Disease, Hypertension, Myocardial Clustered Wound: No Infarction, Type II Diabetes, Dementia Photos Photo Uploaded By: Elpidio Eric on 05/07/2015  16:47:26 Wound Measurements Length: (cm) 0.4 Width: (cm) 0.5 Depth: (cm) 0.1 Area: (cm) 0.157 Volume: (cm) 0.016 % Reduction in Area: 19.9% % Reduction in Volume: 20% Epithelialization: None Tunneling: No Undermining: No Wound Description Classification: Grade 1 Wound Margin: Distinct, outline attached Exudate Amount: Medium Exudate Type: Serosanguineous Exudate Color: red, brown Foul Odor After Cleansing: No Wound Bed Granulation Amount: Large (67-100%) Exposed Structure Granulation Quality: Pink, Pale Fascia Exposed: No Necrotic Amount: Small (1-33%) Fat Layer Exposed: No Necrotic Quality: Adherent Slough Tendon Exposed: No Ballard, Caedyn E. ([id number]) Muscle Exposed: No Joint Exposed: No Bone Exposed: No Limited to Skin Breakdown Periwound Skin Texture Texture Color No Abnormalities Noted: No No Abnormalities Noted: No Callus: No Atrophie Blanche: No Crepitus: No Cyanosis: No Excoriation: No Ecchymosis: No Fluctuance: No Erythema: No Friable: No Hemosiderin Staining: No Induration: No Mottled: No Localized Edema: No Pallor: No Rash: No Rubor: No Scarring: No Temperature / Pain Moisture Temperature: No Abnormality No Abnormalities Noted: No Tenderness on Palpation: Yes Dry / Scaly: No Maceration: No Moist: Yes Wound Preparation Ulcer Cleansing: Rinsed/Irrigated with Saline Topical Anesthetic Applied: Other: lidocaine 4%, Electronic Signature(s) Signed: 05/07/2015 4:56:55 PM By: Elpidio Eric BSN, RN Entered By: Elpidio Eric on 05/07/2015 11:01:09 John [name] ([id number]) -------------------------------------------------------------------------------- Vitals Details Patient Name: John [name]. Date of Service: 05/07/2015 10:45 AM Medical Record Number: [id number] Patient Account Number: [id number] Date of Birth/Sex: Jan 06, 1923 (82 y.o. Male) Treating RN: Afful, RN, BSN, [place] Sink Primary Care Physician: Rodrigo Ran Other  Clinician: Referring Physician: Rodrigo Ran Treating Physician/Extender: Rudene Re in Treatment: 1 Vital Signs Time Taken: 10:53 Temperature (F): 98.2 Height (in): 72 Pulse (bpm): 68 Weight (lbs): 239 Respiratory Rate (breaths/min): 18 Body Mass Index (BMI): 32.4 Blood Pressure (mmHg): 121/53 Reference Range: 80 - 120 mg / dl Electronic Signature(s) Signed: 05/07/2015 4:56:55 PM By: Elpidio Eric BSN, RN Entered By: Elpidio Eric on 05/07/2015 10:54:19

## 2015-05-07 NOTE — Progress Notes (Signed)
 [facility]   Patient name: John [name] MRN: [id number] DOB: 10/28/1922 Sex: male  REASON FOR VISIT: Left second toe wound. Referred by Dr. Britto  HPI: John [name] is a 82 y.o. male who I originally saw in consultation in January 2016. He was referred with a wound on his left second toe and evidence of peripheral vascular disease. At the time of that visit, ABI on the left was 78%. 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 The wound was very small about 7 mm in diameter. I planned on seeing him back in 3-4 weeks. He was then lost to follow up. Of note he has a history of Alzheimer's dementia.  According to the family, he is continued to have a wound on the left second toe for over a year. He was recently evaluated at the wound care center and referred for vascular consultation. He denies any history of claudication although I think his activity is very limited. He denies any history of rest pain. He is unaware of any previous history of DVT.  Past Medical History  Diagnosis Date  . Ventricular tachycardia (HCC)     Syncope-2010 treated with amiodarone  . Subdural hematoma (HCC) 11/05    Precluding Coumadin  . Atrial fibrillation (HCC)     Permanent  . HTN (hypertension)   . CAD (coronary artery disease)     s/p CABG; Failed LAD PCI 2010  . HLD (hyperlipidemia)   . BPH (benign prostatic hyperplasia)   . Decreased hearing   . Gilbert syndrome   . Carotid artery occlusion   . Dementia   . DM2 (diabetes mellitus, type 2) (HCC)   . Memory loss   . CHF (congestive heart failure) (HCC)   . Stroke (HCC)     Family History  Problem Relation Age of Onset  . Coronary artery disease Mother   . Cancer Father     SOCIAL HISTORY: Social History  Substance Use Topics  . Smoking status: Never Smoker   . Smokeless tobacco: Never Used  . Alcohol Use: No    No Known  Allergies  Current Outpatient Prescriptions  Medication Sig Dispense Refill  . amiodarone (PACERONE) 200 MG tablet Take 200 mg by mouth daily. Sunday,Monday,Wednesday,Thursday, & Friday only    . aspirin 81 MG tablet Take 81 mg by mouth daily.      . carvedilol (COREG) 6.25 MG tablet Take 1 tablet (6.25 mg total) by mouth 2 (two) times daily with a meal. 60 tablet 11  . cephALEXin (KEFLEX) 500 MG capsule Take 1 capsule (500 mg total) by mouth 3 (three) times daily. 42 capsule 1  . Cholecalciferol (VITAMIN D3) 1000 UNITS CAPS Take 1 capsule by mouth daily.    . ergocalciferol (VITAMIN D2) 50000 UNITS capsule Take 50,000 Units by mouth once a week. Sunday    . metFORMIN (GLUCOPHAGE) 500 MG tablet Take 250 mg by mouth daily with breakfast.     . Multiple Vitamins-Minerals (EYE VITAMINS) CAPS Take by mouth 2 (two) times daily.      . pravastatin (PRAVACHOL) 40 MG tablet Take 40 mg by mouth daily.      . rivastigmine (EXELON) 4.6 mg/24hr Place 1 patch onto the skin daily.      . triamterene-hydrochlorothiazide (MAXZIDE-25) 37.5-25 MG per tablet Take 0.5 tablets by mouth every other day.      No current facility-administered medications for   this visit.    REVIEW OF SYSTEMS:  [X]  denotes positive finding, [ ]  denotes negative finding Cardiac  Comments:  Chest pain or chest pressure:    Shortness of breath upon exertion:    Short of breath when lying flat:    Irregular heart rhythm:        Vascular    Pain in calf, thigh, or hip brought on by ambulation:    Pain in feet at night that wakes you up from your sleep:     Blood clot in your veins:    Leg swelling:         Pulmonary    Oxygen at home:    Productive cough:     Wheezing:         Neurologic    Sudden weakness in arms or legs:     Sudden numbness in arms or legs:     Sudden onset of difficulty speaking or slurred speech:    Temporary loss of vision in one eye:     Problems with dizziness:  X       Gastrointestinal     Blood in stool:     Vomited blood:         Genitourinary    Burning when urinating:     Blood in urine:        Psychiatric    Major depression:         Hematologic    Bleeding problems:    Problems with blood clotting too easily:        Skin    Rashes or ulcers: X       Constitutional    Fever or chills:      PHYSICAL EXAM: Filed Vitals:   05/07/15 1615  BP: 122/70  Pulse: 68  Temp: 97.2 F (36.2 C)  TempSrc: Oral  Resp: 16  Height: 5\' 10"  (1.778 m)  Weight: 242 lb (109.77 kg)  SpO2: 98%    GENERAL: The patient is a well-nourished male, in no acute distress. The vital signs are documented above. CARDIAC: There is a regular rate and rhythm.  VASCULAR: I do not detect carotid bruits. On the left side, which is the symptomatic side, he has a palpable femoral and popliteal pulse. I cannot palpate pedal pulses. He has a reasonable but monophasic dorsalis pedis signal and peroneal signal on the left. I cannot obtain a posterior tibial signal. On the right side, he has a palpable femoral and popliteal pulse. He has a monophasic dorsalis pedis, posterior tibial, and peroneal signal with the Doppler. He has bilateral lower extremity swelling. PULMONARY: There is good air exchange bilaterally without wheezing or rales. ABDOMEN: Soft and non-tender with normal pitched bowel sounds.  MUSCULOSKELETAL: There are no major deformities or cyanosis. NEUROLOGIC: No focal weakness or paresthesias are detected. SKIN: He has a wound on his left second toe that measures about 5 mm in diameter. It appears reasonably well perfused. He has hyperpigmentation bilaterally consistent with chronic venous insufficiency. He does have cellulitis on the dorsum of the foot. PSYCHIATRIC: The patient has a normal affect.  MEDICAL ISSUES:  NONHEALING WOUND LEFT SECOND TOE WITH TIBIAL ARTERY OCCLUSIVE DISEASE: Based on his exam he does have evidence of tibial artery occlusive disease. He has had the  wound for over a year. In addition he has evidence of chronic venous insufficiency. The swelling in his leg may also be interfering with wound healing. For all these reasons I have recommended that we  proceed with an arteriogram to further assess his tibial artery occlusive disease.  I have reviewed with the patient the indications for arteriography. In addition, I have reviewed the potential complications of arteriography including but not limited to: Bleeding, arterial injury, arterial thrombosis, dye action, renal insufficiency, or other unpredictable medical problems. I have explained to the patient that if we find disease amenable to angioplasty we could potentially address this at the same time. I have discussed the potential complications of angioplasty and stenting, including but not limited to: Bleeding, arterial thrombosis, arterial injury, dissection, or the need for surgical intervention.  If he does not have significant disease then we will know that were not missing anything that might compromise his healing. In addition, we'll feel more confident about recommending leg elevation and compression therapy for his venous disease which may also facilitate healing of his wound. If he does have significant disease hopefully it would be amenable to angioplasty which could be addressed at the same time. If he has non-reconstructable disease that will also be helpful to know in terms of planning. If he would require a tibial bypass then I think he is probably too high risk for that given his history of coronary artery disease, ischemic cardiomyopathy, congestive heart failure, obesity, and given his age.  I have started him on Keflex.  His arteriogram is scheduled for 05/17/2015. I will make further recommendations pending these results.     Waverly Ferrari [facility] [place] Beeper: [phone]

## 2015-05-08 NOTE — Progress Notes (Signed)
DOMINYCK, CALIFF ([id number]) Visit Report for 05/07/2015 Chief Complaint Document Details Patient Name: John [name], John [name]. Date of Service: 05/07/2015 10:45 AM Medical Record Number: [id number] Patient Account Number: [id number] Date of Birth/Sex: [birth year] (82 y.o. Male) Treating RN: Afful, RN, BSN, [place] Sink Primary Care Physician: Rodrigo Ran Other Clinician: Referring Physician: Rodrigo Ran Treating Physician/Extender: Rudene Re in Treatment: 1 Information Obtained from: Patient Chief Complaint Patients presents for treatment of an open diabetic ulcer which she's had on his left toe on the dorsum for about 18 months and recently had a injury to his left forearm a day ago Electronic Signature(s) Signed: 05/07/2015 11:15:43 AM By: Evlyn Kanner MD, FACS Entered By: Evlyn Kanner on 05/07/2015 11:15:43 John [name] ([id number]) -------------------------------------------------------------------------------- Debridement Details Patient Name: John [name]. Date of Service: 05/07/2015 10:45 AM Medical Record Number: [id number] Patient Account Number: [id number] Date of Birth/Sex: 09-Aug-1922 (82 y.o. Male) Treating RN: Afful, RN, BSN, [place] Sink Primary Care Physician: Rodrigo Ran Other Clinician: Referring Physician: Rodrigo Ran Treating Physician/Extender: Rudene Re in Treatment: 1 Debridement Performed for Wound #2 Left,Dorsal Toe Second Assessment: Performed By: Physician Evlyn Kanner, MD Debridement: Debridement Pre-procedure Yes Verification/Time Out Taken: Start Time: 11:08 Pain Control: Lidocaine 4% Topical Solution Level: Skin/Subcutaneous Tissue Total Area Debrided (L x 0.4 (cm) x 0.5 (cm) = 0.2 (cm) W): Tissue and other Viable, Non-Viable, Exudate, Fibrin/Slough, Subcutaneous material debrided: Instrument: Curette Bleeding: Minimum Hemostasis Achieved: Pressure End Time: 11:10 Procedural Pain: 0 Post Procedural Pain: 0 Response to Treatment:  Procedure was tolerated well Post Procedure Diagnosis Same as Pre-procedure Electronic Signature(s) Signed: 05/07/2015 11:15:36 AM By: Evlyn Kanner MD, FACS Signed: 05/07/2015 4:56:55 PM By: Elpidio Eric BSN, RN Entered By: Evlyn Kanner on 05/07/2015 11:15:36 John [name] ([id number]) -------------------------------------------------------------------------------- HPI Details Patient Name: John [name]. Date of Service: 05/07/2015 10:45 AM Medical Record Number: [id number] Patient Account Number: [id number] Date of Birth/Sex: [birth year] (82 y.o. Male) Treating RN: Afful, RN, BSN, [place] Sink Primary Care Physician: Rodrigo Ran Other Clinician: Referring Physician: Rodrigo Ran Treating Physician/Extender: Rudene Re in Treatment: 1 History of Present Illness Location: left toe ulcer which she's had for about 18 months and he has had a injury to his left forearm a day ago. Quality: Patient reports No Pain. Severity: Patient states wound are getting worse. Duration: Patient has had the wound for > 18 months prior to seeking treatment at the wound center Context: The wound appeared gradually over time Modifying Factors: Other treatment(s) tried include:has been seeing a podiatrist for about 18 months and is also seen a vascular surgeon a while ago Associated Signs and Symptoms: Patient reports having increase swelling. HPI Description: 82 year old patient with past medical history of hypertension, hyperlipidemia, diabetes mellitus, diabetic retinopathy, dementia, peripheral vascular disease, diabetic ulcer left foot. The patient has a pacemaker. His cardiac issues include ventricular tachycardia, atrial fibrillation, ischemic cardiomyopathy, pacemaker, complete heart block. He has never been a smoker. Last hemoglobin A1c was 5.8 in March 2017. The patient has been seen by Dr. 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 At that stage conservative therapy was recommended but note was made of the fact that the wound progresses a toe amputation may not have adequate circulation to heal. No recent x-rays have been done by his podiatrist. 05/07/2015 -- he will not leave his dressing intact on his forearm and keeps removing  it several times a day. He has an appointment to see Dr. Woodfin Ganja this afternoon. x-ray of the left foot done on 04/30/2015 does not show any acute bony abnormality Electronic Signature(s) Signed: 05/07/2015 11:18:22 AM By: Evlyn Kanner MD, FACS Previous Signature: 05/07/2015 11:16:20 AM Version By: Evlyn Kanner MD, FACS Entered By: Evlyn Kanner on 05/07/2015 11:18:21 John [name] ([id number]) -------------------------------------------------------------------------------- Physical Exam Details Patient Name: John Snow E. Date of Service: 05/07/2015 10:45 AM Medical Record Number: [id number] Patient Account Number: [id number] Date of Birth/Sex: Aug 04, 1922 (82 y.o. Male) Treating RN: Clover Mealy, RN, BSN, [place] Sink Primary Care Physician: Rodrigo Ran Other Clinician: Referring Physician: Rodrigo Ran Treating Physician/Extender: Rudene Re in Treatment: 1 Constitutional . Pulse regular. Respirations normal and unlabored. Afebrile. . Eyes Nonicteric. Reactive to light. Ears, Nose, Mouth, and Throat Lips, teeth, and gums WNL.Marland Kitchen Moist mucosa without lesions. Neck supple and nontender. No palpable supraclavicular or cervical adenopathy. Normal sized without goiter. Respiratory WNL. No retractions.. Breath sounds WNL, No rubs, rales, rhonchi, or wheeze.. Cardiovascular Heart rhythm and rate regular, no murmur or gallop.. Pedal Pulses WNL. No clubbing, cyanosis or edema. Lymphatic No adneopathy. No adenopathy. No adenopathy. Musculoskeletal Adexa without tenderness or enlargement.. Digits and nails w/o  clubbing, cyanosis, infection, petechiae, ischemia, or inflammatory conditions.. Integumentary (Hair, Skin) No suspicious lesions. No crepitus or fluctuance. No peri-wound warmth or erythema. No masses.Marland Kitchen Psychiatric Judgement and insight Intact.. No evidence of depression, anxiety, or agitation.. Notes the left forearm looks good and there is no evidence of necrosis or cellulitis. The dorsum of the left second toe still has some eschar and I sharply debrided this with a curette. Electronic Signature(s) Signed: 05/07/2015 11:17:04 AM By: Evlyn Kanner MD, FACS Entered By: Evlyn Kanner on 05/07/2015 11:17:03 John [name] ([id number]) -------------------------------------------------------------------------------- Physician Orders Details Patient Name: John [name]. Date of Service: 05/07/2015 10:45 AM Medical Record Number: [id number] Patient Account Number: [id number] Date of Birth/Sex: 10-22-1922 (82 y.o. Male) Treating RN: Afful, RN, BSN, [place] Sink Primary Care Physician: Rodrigo Ran Other Clinician: Referring Physician: Rodrigo Ran Treating Physician/Extender: Rudene Re in Treatment: 1 Verbal / Phone Orders: Yes Clinician: Afful, RN, BSN, Rita Read Back and Verified: Yes Diagnosis Coding Wound Cleansing Wound #2 Left,Dorsal Toe Second o Cleanse wound with mild soap and water o May Shower, gently pat wound dry prior to applying new dressing. o No tub bath. Anesthetic Wound #1 Left Forearm o Topical Lidocaine 4% cream applied to wound bed prior to debridement Wound #2 Left,Dorsal Toe Second o Topical Lidocaine 4% cream applied to wound bed prior to debridement Primary Wound Dressing Wound #1 Left Forearm o Foam Wound #2 Left,Dorsal Toe Second o Medihoney gel Secondary Dressing Wound #1 Left Forearm o Gauze and Kerlix/Conform Wound #2 Left,Dorsal Toe Second o Gauze and Kerlix/Conform Dressing Change Frequency Wound #1 Left Forearm o  Change dressing every week Wound #2 Left,Dorsal Toe Second o Change dressing every other day. Follow-up Appointments Wound #1 Left Forearm John [name], John E. ([id number]) o Return Appointment in 1 week. Wound #2 Left,Dorsal Toe Second o Return Appointment in 1 week. Patient Medications Allergies: no known allergies Notifications Medication Indication Start End MediHoney (honey) 05/07/2015 DOSE topical 100 % paste - paste topical as directed Electronic Signature(s) Signed: 05/07/2015 11:19:31 AM By: Evlyn Kanner MD, FACS Entered By: Evlyn Kanner on 05/07/2015 11:19:30 John [name] ([id number]) -------------------------------------------------------------------------------- Problem List Details Patient Name: John Snow E. Date of Service: 05/07/2015 10:45 AM Medical Record Number: [id number] Patient Account Number: [id number] Date of Birth/Sex:  12-Apr-1922 (82 y.o. Male) Treating RN: Afful, RN, BSN, [place] Sink Primary Care Physician: Rodrigo Ran Other Clinician: Referring Physician: Rodrigo Ran Treating Physician/Extender: Rudene Re in Treatment: 1 Active Problems ICD-10 Encounter Code Description Active Date Diagnosis E11.621 Type 2 diabetes mellitus with foot ulcer 04/30/2015 Yes S41.112A Laceration without foreign body of left upper arm, initial 04/30/2015 Yes encounter L97.522 Non-pressure chronic ulcer of other part of left foot with fat 04/30/2015 Yes layer exposed I70.245 Atherosclerosis of native arteries of left leg with ulceration 04/30/2015 Yes of other part of foot Inactive Problems Resolved Problems Electronic Signature(s) Signed: 05/07/2015 11:15:23 AM By: Evlyn Kanner MD, FACS Entered By: Evlyn Kanner on 05/07/2015 11:15:22 John [name] ([id number]) -------------------------------------------------------------------------------- Progress Note Details Patient Name: John [name]. Date of Service: 05/07/2015 10:45 AM Medical Record Number:  [id number] Patient Account Number: [id number] Date of Birth/Sex: 12/18/1922 (82 y.o. Male) Treating RN: Afful, RN, BSN, [place] Sink Primary Care Physician: Rodrigo Ran Other Clinician: Referring Physician: Rodrigo Ran Treating Physician/Extender: Rudene Re in Treatment: 1 Subjective Chief Complaint Information obtained from Patient Patients presents for treatment of an open diabetic ulcer which she's had on his left toe on the dorsum for about 18 months and recently had a injury to his left forearm a day ago History of Present Illness (HPI) The following HPI elements were documented for the patient's wound: Location: left toe ulcer which she's had for about 18 months and he has had a injury to his left forearm a day ago. Quality: Patient reports No Pain. Severity: Patient states wound are getting worse. Duration: Patient has had the wound for > 18 months prior to seeking treatment at the wound center Context: The wound appeared gradually over time Modifying Factors: Other treatment(s) tried include:has been seeing a podiatrist for about 18 months and is also seen a vascular surgeon a while ago Associated Signs and Symptoms: Patient reports having increase swelling. 82 year old patient with past medical history of hypertension, hyperlipidemia, diabetes mellitus, diabetic retinopathy, dementia, peripheral vascular disease, diabetic ulcer left foot. The patient has a pacemaker. His cardiac issues include ventricular tachycardia, atrial fibrillation, ischemic cardiomyopathy, pacemaker, complete heart block. He has never been a smoker. Last hemoglobin A1c was 5.8 in March 2017. The patient has been seen by Dr. 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 At that stage conservative therapy was recommended but note was made of the fact that the wound progresses  a toe amputation may not have adequate circulation to heal. No recent x-rays have been done by his podiatrist. 05/07/2015 -- he will not leave his dressing intact on his forearm and keeps removing it several times a day. He has an appointment to see Dr. Woodfin Ganja this afternoon. x-ray of the left foot done on 04/30/2015 does not show any acute bony abnormality Objective John [name], John E. ([id number]) Constitutional Pulse regular. Respirations normal and unlabored. Afebrile. Vitals Time Taken: 10:53 AM, Height: 72 in, Weight: 239 lbs, BMI: 32.4, Temperature: 98.2 F, Pulse: 68 bpm, Respiratory Rate: 18 breaths/min, Blood Pressure: 121/53 mmHg. Eyes Nonicteric. Reactive to light. Ears, Nose, Mouth, and Throat Lips, teeth, and gums WNL.Marland Kitchen Moist mucosa without lesions. Neck supple and nontender. No palpable supraclavicular or cervical adenopathy. Normal sized without goiter. Respiratory WNL. No retractions.. Breath sounds WNL, No rubs, rales, rhonchi, or wheeze.. Cardiovascular Heart rhythm and rate regular, no murmur or gallop.Marland Kitchen  Pedal Pulses WNL. No clubbing, cyanosis or edema. Lymphatic No adneopathy. No adenopathy. No adenopathy. Musculoskeletal Adexa without tenderness or enlargement.. Digits and nails w/o clubbing, cyanosis, infection, petechiae, ischemia, or inflammatory conditions.Marland Kitchen Psychiatric Judgement and insight Intact.. No evidence of depression, anxiety, or agitation.. General Notes: the left forearm looks good and there is no evidence of necrosis or cellulitis. The dorsum of the left second toe still has some eschar and I sharply debrided this with a curette. Integumentary (Hair, Skin) No suspicious lesions. No crepitus or fluctuance. No peri-wound warmth or erythema. No masses.. Wound #1 status is Open. Original cause of wound was Trauma. The wound is located on the Left Forearm. The wound measures 1cm length x 0.4cm width x 0.1cm depth; 0.314cm^2 area and 0.031cm^3  volume. The wound is limited to skin breakdown. There is no tunneling or undermining noted. There is a small amount of serosanguineous drainage noted. The wound margin is distinct with the outline attached to the wound base. There is large (67-100%) red granulation within the wound bed. There is no necrotic tissue within the wound bed. The periwound skin appearance exhibited: Moist. The periwound skin appearance did not exhibit: Erythema. Periwound temperature was noted as No Abnormality. The periwound has tenderness on palpation. Wound #2 status is Open. Original cause of wound was Gradually Appeared. The wound is located on the Left,Dorsal Toe Second. The wound measures 0.4cm length x 0.5cm width x 0.1cm depth; 0.157cm^2 area John [name], John E. ([id number]) and 0.016cm^3 volume. The wound is limited to skin breakdown. There is no tunneling or undermining noted. There is a medium amount of serosanguineous drainage noted. The wound margin is distinct with the outline attached to the wound base. There is large (67-100%) pink, pale granulation within the wound bed. There is a small (1-33%) amount of necrotic tissue within the wound bed including Adherent Slough. The periwound skin appearance exhibited: Moist. The periwound skin appearance did not exhibit: Callus, Crepitus, Excoriation, Fluctuance, Friable, Induration, Localized Edema, Rash, Scarring, Dry/Scaly, Maceration, Atrophie Blanche, Cyanosis, Ecchymosis, Hemosiderin Staining, Mottled, Pallor, Rubor, Erythema. Periwound temperature was noted as No Abnormality. The periwound has tenderness on palpation. Assessment Active Problems ICD-10 E11.621 - Type 2 diabetes mellitus with foot ulcer S41.112A - Laceration without foreign body of left upper arm, initial encounter L97.522 - Non-pressure chronic ulcer of other part of left foot with fat layer exposed I70.245 - Atherosclerosis of native arteries of left leg with ulceration of other part of  foot Procedures Wound #2 Wound #2 is a Diabetic Wound/Ulcer of the Lower Extremity located on the Left,Dorsal Toe Second . There was a Skin/Subcutaneous Tissue Debridement ([id number]) debridement with total area of 0.2 sq cm performed by Evlyn Kanner, MD. with the following instrument(s): Curette to remove Viable and Non-Viable tissue/material including Exudate, Fibrin/Slough, and Subcutaneous after achieving pain control using Lidocaine 4% Topical Solution. A time out was conducted prior to the start of the procedure. A Minimum amount of bleeding was controlled with Pressure. The procedure was tolerated well with a pain level of 0 throughout and a pain level of 0 following the procedure. Post procedure Diagnosis Wound #2: Same as Pre-Procedure Plan Wound Cleansing: Wound #2 Left,Dorsal Toe Second: John [name], John E. ([id number]) Cleanse wound with mild soap and water May Shower, gently pat wound dry prior to applying new dressing. No tub bath. Anesthetic: Wound #1 Left Forearm: Topical Lidocaine 4% cream applied to wound bed prior to debridement Wound #2 Left,Dorsal Toe Second: Topical Lidocaine 4% cream applied to wound  bed prior to debridement Primary Wound Dressing: Wound #1 Left Forearm: Foam Wound #2 Left,Dorsal Toe Second: Medihoney gel Secondary Dressing: Wound #1 Left Forearm: Gauze and Kerlix/Conform Wound #2 Left,Dorsal Toe Second: Gauze and Kerlix/Conform Dressing Change Frequency: Wound #1 Left Forearm: Change dressing every week Wound #2 Left,Dorsal Toe Second: Change dressing every other day. Follow-up Appointments: Wound #1 Left Forearm: Return Appointment in 1 week. Wound #2 Left,Dorsal Toe Second: Return Appointment in 1 week. The following medication(s) was prescribed: MediHoney (honey) topical 100 % paste paste topical as directed starting 05/07/2015 This 82 year old gentleman who has diabetes mellitus which is well controlled and  significant atherosclerotic disease of his left lower extremity blood vessels now has a nonhealing wound on the left toe which she's had for over 18 months. the acute injury on his left forearm has been treated with appropriate foam dressing and Kerlix and Coban to be left in place for a week. As far as his right foot goes I have recommended: 1. Local dressing with Medihoney and a foam cover 2. X-ray of the left foot -- no acute bony abnormality was noted 3. Review by his vascular surgeon Dr. Waverly Ferrari -- appointment is this afternoon John [name], John E. ([id number]) 4. Regular visits at the wound center for closely monitoring this situation which has been chronic for over 18 months the family has had all questions answered and will be compliant Electronic Signature(s) Signed: 05/07/2015 11:19:57 AM By: Evlyn Kanner MD, FACS Previous Signature: 05/07/2015 11:18:02 AM Version By: Evlyn Kanner MD, FACS Entered By: Evlyn Kanner on 05/07/2015 11:19:57 John [name] ([id number]) -------------------------------------------------------------------------------- SuperBill Details Patient Name: John [name]. Date of Service: 05/07/2015 Medical Record Number: [id number] Patient Account Number: [id number] Date of Birth/Sex: 11/09/1922 (82 y.o. Male) Treating RN: Afful, RN, BSN, [place] Sink Primary Care Physician: Rodrigo Ran Other Clinician: Referring Physician: Rodrigo Ran Treating Physician/Extender: Rudene Re in Treatment: 1 Diagnosis Coding ICD-10 Codes Code Description E11.621 Type 2 diabetes mellitus with foot ulcer S41.112A Laceration without foreign body of left upper arm, initial encounter L97.522 Non-pressure chronic ulcer of other part of left foot with fat layer exposed I70.245 Atherosclerosis of native arteries of left leg with ulceration of other part of foot Facility Procedures CPT4: Description Modifier Quantity Code 27078675 11042 - DEB SUBQ TISSUE 20 SQ CM/<  1 ICD-10 Description Diagnosis E11.621 Type 2 diabetes mellitus with foot ulcer L97.522 Non-pressure chronic ulcer of other part of left foot with fat layer exposed  I70.245 Atherosclerosis of native arteries of left leg with ulceration of other part of foot S41.112A Laceration without foreign body of left upper arm, initial encounter Physician Procedures CPT4: Description Modifier Quantity Code 4492010 99213 - WC PHYS LEVEL 3 - EST PT 25 1 ICD-10 Description Diagnosis E11.621 Type 2 diabetes mellitus with foot ulcer S41.112A Laceration without foreign body of left upper arm, initial encounter L97.522  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 ([id number]) Electronic Signature(s) Signed: 05/07/2015 11:20:24 AM By: Evlyn Kanner MD, FACS Entered By: Evlyn Kanner on 05/07/2015 11:20:24

## 2015-05-10 ENCOUNTER — Other Ambulatory Visit: Payer: Self-pay

## 2015-05-17 ENCOUNTER — Encounter (HOSPITAL_COMMUNITY)
Admission: RE | Disposition: A | Discharge status: Home / Self Care | Payer: Self-pay | Source: Ambulatory Visit | Attending: Vascular Surgery

## 2015-05-17 ENCOUNTER — Ambulatory Visit (HOSPITAL_COMMUNITY)
Admission: RE | Admit: 2015-05-17 | Discharge: 2015-05-17 | Disposition: A | Discharge status: Home / Self Care | Payer: Medicare Other | Source: Ambulatory Visit | Attending: Vascular Surgery | Admitting: Vascular Surgery

## 2015-05-17 DIAGNOSIS — Z7982 Long term (current) use of aspirin: Secondary | ICD-10-CM | POA: Insufficient documentation

## 2015-05-17 DIAGNOSIS — I872 Venous insufficiency (chronic) (peripheral): Secondary | ICD-10-CM | POA: Diagnosis present

## 2015-05-17 DIAGNOSIS — I70299 Other atherosclerosis of native arteries of extremities, unspecified extremity: Secondary | ICD-10-CM | POA: Diagnosis present

## 2015-05-17 DIAGNOSIS — I255 Ischemic cardiomyopathy: Secondary | ICD-10-CM | POA: Insufficient documentation

## 2015-05-17 DIAGNOSIS — I2510 Atherosclerotic heart disease of native coronary artery without angina pectoris: Secondary | ICD-10-CM | POA: Insufficient documentation

## 2015-05-17 DIAGNOSIS — I4891 Unspecified atrial fibrillation: Secondary | ICD-10-CM | POA: Insufficient documentation

## 2015-05-17 DIAGNOSIS — E785 Hyperlipidemia, unspecified: Secondary | ICD-10-CM | POA: Diagnosis not present

## 2015-05-17 DIAGNOSIS — E11621 Type 2 diabetes mellitus with foot ulcer: Secondary | ICD-10-CM | POA: Diagnosis not present

## 2015-05-17 DIAGNOSIS — I70245 Atherosclerosis of native arteries of left leg with ulceration of other part of foot: Secondary | ICD-10-CM | POA: Diagnosis not present

## 2015-05-17 DIAGNOSIS — L97529 Non-pressure chronic ulcer of other part of left foot with unspecified severity: Secondary | ICD-10-CM | POA: Diagnosis not present

## 2015-05-17 DIAGNOSIS — I472 Ventricular tachycardia: Secondary | ICD-10-CM | POA: Diagnosis not present

## 2015-05-17 DIAGNOSIS — F0390 Unspecified dementia without behavioral disturbance: Secondary | ICD-10-CM | POA: Insufficient documentation

## 2015-05-17 DIAGNOSIS — I110 Hypertensive heart disease with heart failure: Secondary | ICD-10-CM | POA: Diagnosis not present

## 2015-05-17 DIAGNOSIS — I6529 Occlusion and stenosis of unspecified carotid artery: Secondary | ICD-10-CM | POA: Diagnosis not present

## 2015-05-17 DIAGNOSIS — L97909 Non-pressure chronic ulcer of unspecified part of unspecified lower leg with unspecified severity: Secondary | ICD-10-CM | POA: Diagnosis present

## 2015-05-17 DIAGNOSIS — Z951 Presence of aortocoronary bypass graft: Secondary | ICD-10-CM | POA: Insufficient documentation

## 2015-05-17 DIAGNOSIS — Z7984 Long term (current) use of oral hypoglycemic drugs: Secondary | ICD-10-CM | POA: Insufficient documentation

## 2015-05-17 DIAGNOSIS — Z8673 Personal history of transient ischemic attack (TIA), and cerebral infarction without residual deficits: Secondary | ICD-10-CM | POA: Diagnosis not present

## 2015-05-17 DIAGNOSIS — N400 Enlarged prostate without lower urinary tract symptoms: Secondary | ICD-10-CM | POA: Insufficient documentation

## 2015-05-17 DIAGNOSIS — Z6834 Body mass index (BMI) 34.0-34.9, adult: Secondary | ICD-10-CM | POA: Insufficient documentation

## 2015-05-17 DIAGNOSIS — I509 Heart failure, unspecified: Secondary | ICD-10-CM | POA: Diagnosis not present

## 2015-05-17 DIAGNOSIS — E669 Obesity, unspecified: Secondary | ICD-10-CM | POA: Insufficient documentation

## 2015-05-17 DIAGNOSIS — E1151 Type 2 diabetes mellitus with diabetic peripheral angiopathy without gangrene: Secondary | ICD-10-CM | POA: Insufficient documentation

## 2015-05-17 HISTORY — PX: PERIPHERAL VASCULAR CATHETERIZATION: SHX172C

## 2015-05-17 LAB — POCT I-STAT, CHEM 8
BUN: 17 mg/dL (ref 6–20)
CHLORIDE: 100 mmol/L — AB (ref 101–111)
Calcium, Ion: 1.15 mmol/L (ref 1.13–1.30)
Creatinine, Ser: 1.1 mg/dL (ref 0.61–1.24)
Glucose, Bld: 115 mg/dL — ABNORMAL HIGH (ref 65–99)
HEMATOCRIT: 41 % (ref 39.0–52.0)
Hemoglobin: 13.9 g/dL (ref 13.0–17.0)
Potassium: 4 mmol/L (ref 3.5–5.1)
SODIUM: 141 mmol/L (ref 135–145)
TCO2: 27 mmol/L (ref 0–100)

## 2015-05-17 LAB — GLUCOSE, CAPILLARY: Glucose-Capillary: 112 mg/dL — ABNORMAL HIGH (ref 65–99)

## 2015-05-17 SURGERY — ABDOMINAL AORTOGRAM W/LOWER EXTREMITY

## 2015-05-17 MED ORDER — SODIUM CHLORIDE 0.9 % IV SOLN
INTRAVENOUS | Status: DC
  Administered 2015-05-17: 09:00:00 via INTRAVENOUS

## 2015-05-17 MED ORDER — HYDRALAZINE HCL 20 MG/ML IJ SOLN: 10.0000 mg | INTRAMUSCULAR | Status: DC | PRN

## 2015-05-17 MED ORDER — LIDOCAINE HCL (PF) 1 % IJ SOLN
INTRAMUSCULAR | Status: DC | PRN
  Administered 2015-05-17: 20 mL

## 2015-05-17 MED ORDER — LIDOCAINE HCL (PF) 1 % IJ SOLN
INTRAMUSCULAR | Status: AC
  Filled 2015-05-17: qty 30

## 2015-05-17 MED ORDER — SODIUM CHLORIDE 0.9 % IV SOLN: 1.0000 mL/kg/h | INTRAVENOUS | Status: AC

## 2015-05-17 MED ORDER — HEPARIN (PORCINE) IN NACL 2-0.9 UNIT/ML-% IJ SOLN
INTRAMUSCULAR | Status: AC
  Filled 2015-05-17: qty 1000

## 2015-05-17 MED ORDER — HEPARIN (PORCINE) IN NACL 2-0.9 UNIT/ML-% IJ SOLN
INTRAMUSCULAR | Status: DC | PRN
  Administered 2015-05-17: 1000 mL

## 2015-05-17 MED ORDER — IODIXANOL 320 MG/ML IV SOLN
INTRAVENOUS | Status: DC | PRN
Start: 2015-05-17 — End: 2015-05-17
  Administered 2015-05-17: 75 mL via INTRAVENOUS

## 2015-05-17 SURGICAL SUPPLY — 12 items
CATH ANGIO 5F PIGTAIL 65CM (CATHETERS) ×2 IMPLANT
CATH CROSS OVER TEMPO 5F (CATHETERS) ×2 IMPLANT
CATH STRAIGHT 5FR 65CM (CATHETERS) ×2 IMPLANT
COVER PRB 48X5XTLSCP FOLD TPE (BAG) IMPLANT
COVER PROBE 5X48 (BAG) ×3
KIT MICROINTRODUCER STIFF 5F (SHEATH) ×2 IMPLANT
KIT PV (KITS) ×3 IMPLANT
SHEATH PINNACLE 5F 10CM (SHEATH) ×2 IMPLANT
SYR MEDRAD MARK V 150ML (SYRINGE) ×3 IMPLANT
TRANSDUCER W/STOPCOCK (MISCELLANEOUS) ×3 IMPLANT
TRAY PV CATH (CUSTOM PROCEDURE TRAY) ×3 IMPLANT
WIRE BENTSON .035X145CM (WIRE) ×2 IMPLANT

## 2015-05-17 NOTE — Discharge Instructions (Signed)
DO NOT RESART METFORMIN UNTILTHURSDAY  May 20 2015    Angiogram, Care After These instructions give you information about caring for yourself after your procedure. Your doctor may also give you more specific instructions. Call your doctor if you have any problems or questions after your procedure.  HOME CARE  Take medicines only as told by your doctor.  Follow your doctor's instructions about:  Care of the area where the tube was inserted.  Bandage (dressing) changes and removal.  You may shower 24-48 hours after the procedure or as told by your doctor.  Do not take baths, swim, or use a hot tub until your doctor approves.  Every day, check the area where the tube was inserted. Watch for:  Redness, swelling, or pain.  Fluid, blood, or pus.  Do not apply powder or lotion to the site.  Do not lift anything that is heavier than 10 lb (4.5 kg) for 5 days or as told by your doctor.  Ask your doctor when you can:  Return to work or school.  Do physical activities or play sports.  Have sex.  Do not drive or operate heavy machinery for 24 hours or as told by your doctor.  Have someone with you for the first 24 hours after the procedure.  Keep all follow-up visits as told by your doctor. This is important. GET HELP IF:  You have a fever.   You have chills.   You have more bleeding from the area where the tube was inserted. Hold pressure on the area.  You have redness, swelling, or pain in the area where the tube was inserted.  You have fluid or pus coming from the area. GET HELP RIGHT AWAY IF:   You have a lot of pain in the area where the tube was inserted.  The area where the tube was inserted is bleeding, and the bleeding does not stop after 30 minutes of holding steady pressure on the area.  The area near or just beyond the insertion site becomes pale, cool, tingly, or numb.   This information is not intended to replace advice given to you by your health  care provider. Make sure you discuss any questions you have with your health care provider.   Document Released: 03/24/2008 Document Revised: 01/16/2014 Document Reviewed: 05/29/2012 Elsevier Interactive Patient Education [employer]

## 2015-05-17 NOTE — Interval H&P Note (Signed)
History and Physical Interval Note:  05/17/2015 9:58 AM  John [name]  has presented today for surgery, with the diagnosis of pvd with left 2nd toe ulcer  The various methods of treatment have been discussed with the patient and family. After consideration of risks, benefits and other options for treatment, the patient has consented to  Procedure(s): Abdominal Aortogram w/Lower Extremity (N/A) as a surgical intervention .  The patient's history has been reviewed, patient examined, no change in status, stable for surgery.  I have reviewed the patient's chart and labs.  Questions were answered to the patient's satisfaction.     Waverly Ferrari

## 2015-05-17 NOTE — Progress Notes (Signed)
Site area: right groin a 5 french arterial sheath was removed  Site Prior to Removal:  Level 0  Pressure Applied For 15 MINUTES    Bedrest Beginning at 1125a  Manual:   Yes.    Patient Status During Pull:  stable  Post Pull Groin Site:  Level 0  Post Pull Instructions Given:  Yes.    Post Pull Pulses Present:  Yes.    Dressing Applied:  Yes.    Comments:  VS remain stable during sheath pull

## 2015-05-17 NOTE — H&P (View-Only) (Signed)
Vascular and Vein Specialist of [facility]  Patient name: John [name] MRN: [id number] DOB: [birth year] Sex: male  REASON FOR VISIT: Left second toe wound. Referred by Dr. Meyer Russel  HPI: John [name] is a 82 y.o. male who I originally saw in consultation in January 2016. He was referred with a wound on his left second toe and evidence of peripheral vascular disease. At the time of that visit, ABI on the left was 78%. 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 The wound was very small about 7 mm in diameter. I planned on seeing him back in 3-4 weeks. He was then lost to follow up. Of note he has a history of Alzheimer's dementia.  According to the family, he is continued to have a wound on the left second toe for over a year. He was recently evaluated at the wound care center and referred for vascular consultation. He denies any history of claudication although I think his activity is very limited. He denies any history of rest pain. He is unaware of any previous history of DVT.  Past Medical History  Diagnosis Date  . Ventricular tachycardia (HCC)     Syncope-2010 treated with amiodarone  . Subdural hematoma (HCC) 11/05    Precluding Coumadin  . Atrial fibrillation (HCC)     Permanent  . HTN (hypertension)   . CAD (coronary artery disease)     s/p CABG; Failed LAD PCI 2010  . HLD (hyperlipidemia)   . BPH (benign prostatic hyperplasia)   . Decreased hearing   . Gilbert syndrome   . Carotid artery occlusion   . Dementia   . DM2 (diabetes mellitus, type 2) (HCC)   . Memory loss   . CHF (congestive heart failure) (HCC)   . Stroke [facility])     Family History  Problem Relation Age of Onset  . Coronary artery disease Mother   . Cancer Father     SOCIAL HISTORY: Social History  Substance Use Topics  . Smoking status: Never Smoker   . Smokeless tobacco: Never Used  . Alcohol Use: No    No Known  Allergies  Current Outpatient Prescriptions  Medication Sig Dispense Refill  . amiodarone (PACERONE) 200 MG tablet Take 200 mg by mouth daily. Sunday,Monday,Wednesday,Thursday, & Friday only    . aspirin 81 MG tablet Take 81 mg by mouth daily.      . carvedilol (COREG) 6.25 MG tablet Take 1 tablet (6.25 mg total) by mouth 2 (two) times daily with a meal. 60 tablet 11  . cephALEXin (KEFLEX) 500 MG capsule Take 1 capsule (500 mg total) by mouth 3 (three) times daily. 42 capsule 1  . Cholecalciferol (VITAMIN D3) 1000 UNITS CAPS Take 1 capsule by mouth daily.    . ergocalciferol (VITAMIN D2) 50000 UNITS capsule Take 50,000 Units by mouth once a week. Sunday    . metFORMIN (GLUCOPHAGE) 500 MG tablet Take 250 mg by mouth daily with breakfast.     . Multiple Vitamins-Minerals (EYE VITAMINS) CAPS Take by mouth 2 (two) times daily.      . pravastatin (PRAVACHOL) 40 MG tablet Take 40 mg by mouth daily.      . rivastigmine (EXELON) 4.6 mg/24hr Place 1 patch onto the skin daily.      Marland Kitchen triamterene-hydrochlorothiazide (MAXZIDE-25) 37.5-25 MG per tablet Take 0.5 tablets by mouth every other day.      No current facility-administered medications for  this visit.    REVIEW OF SYSTEMS:  [X]  denotes positive finding, [ ]  denotes negative finding Cardiac  Comments:  Chest pain or chest pressure:    Shortness of breath upon exertion:    Short of breath when lying flat:    Irregular heart rhythm:        Vascular    Pain in calf, thigh, or hip brought on by ambulation:    Pain in feet at night that wakes you up from your sleep:     Blood clot in your veins:    Leg swelling:         Pulmonary    Oxygen at home:    Productive cough:     Wheezing:         Neurologic    Sudden weakness in arms or legs:     Sudden numbness in arms or legs:     Sudden onset of difficulty speaking or slurred speech:    Temporary loss of vision in one eye:     Problems with dizziness:  X       Gastrointestinal     Blood in stool:     Vomited blood:         Genitourinary    Burning when urinating:     Blood in urine:        Psychiatric    Major depression:         Hematologic    Bleeding problems:    Problems with blood clotting too easily:        Skin    Rashes or ulcers: X       Constitutional    Fever or chills:      PHYSICAL EXAM: Filed Vitals:   05/07/15 1615  BP: 122/70  Pulse: 68  Temp: 97.2 F (36.2 C)  TempSrc: Oral  Resp: 16  Height: 5\' 10"  (1.778 m)  Weight: 242 lb (109.77 kg)  SpO2: 98%    GENERAL: The patient is a well-nourished male, in no acute distress. The vital signs are documented above. CARDIAC: There is a regular rate and rhythm.  VASCULAR: I do not detect carotid bruits. On the left side, which is the symptomatic side, he has a palpable femoral and popliteal pulse. I cannot palpate pedal pulses. He has a reasonable but monophasic dorsalis pedis signal and peroneal signal on the left. I cannot obtain a posterior tibial signal. On the right side, he has a palpable femoral and popliteal pulse. He has a monophasic dorsalis pedis, posterior tibial, and peroneal signal with the Doppler. He has bilateral lower extremity swelling. PULMONARY: There is good air exchange bilaterally without wheezing or rales. ABDOMEN: Soft and non-tender with normal pitched bowel sounds.  MUSCULOSKELETAL: There are no major deformities or cyanosis. NEUROLOGIC: No focal weakness or paresthesias are detected. SKIN: He has a wound on his left second toe that measures about 5 mm in diameter. It appears reasonably well perfused. He has hyperpigmentation bilaterally consistent with chronic venous insufficiency. He does have cellulitis on the dorsum of the foot. PSYCHIATRIC: The patient has a normal affect.  MEDICAL ISSUES:  NONHEALING WOUND LEFT SECOND TOE WITH TIBIAL ARTERY OCCLUSIVE DISEASE: Based on his exam he does have evidence of tibial artery occlusive disease. He has had the  wound for over a year. In addition he has evidence of chronic venous insufficiency. The swelling in his leg may also be interfering with wound healing. For all these reasons I have recommended that we  proceed with an arteriogram to further assess his tibial artery occlusive disease.  I have reviewed with the patient the indications for arteriography. In addition, I have reviewed the potential complications of arteriography including but not limited to: Bleeding, arterial injury, arterial thrombosis, dye action, renal insufficiency, or other unpredictable medical problems. I have explained to the patient that if we find disease amenable to angioplasty we could potentially address this at the same time. I have discussed the potential complications of angioplasty and stenting, including but not limited to: Bleeding, arterial thrombosis, arterial injury, dissection, or the need for surgical intervention.  If he does not have significant disease then we will know that were not missing anything that might compromise his healing. In addition, we'll feel more confident about recommending leg elevation and compression therapy for his venous disease which may also facilitate healing of his wound. If he does have significant disease hopefully it would be amenable to angioplasty which could be addressed at the same time. If he has non-reconstructable disease that will also be helpful to know in terms of planning. If he would require a tibial bypass then I think he is probably too high risk for that given his history of coronary artery disease, ischemic cardiomyopathy, congestive heart failure, obesity, and given his age.  I have started him on Keflex.  His arteriogram is scheduled for 05/17/2015. I will make further recommendations pending these results.     Waverly Ferrari [facility] [place]: [phone]

## 2015-05-17 NOTE — Op Note (Signed)
   PATIENT: John [name]   MRN: [id number] DOB: 06/04/1922    DATE OF PROCEDURE: 05/17/2015  INDICATIONS: RANE BLITCH is a 82 y.o. male with a nonhealing wound on his left foot. He has both evidence of tibial artery occlusive disease and also chronic venous insufficiency. I recommended arteriography in order to help direct his therapy including the potential for leg elevation and compression therapy given his venous disease.  PROCEDURE:  1. Ultrasound-guided access to the right common femoral artery 2. Aortogram with bilateral iliac arteriogram 3. Selective catheterization of the left external iliac artery with left lower extremity runoff  SURGEON: Di Kindle. Edilia Bo, MD, FACS  ANESTHESIA: local   EBL: minimal  TECHNIQUE: The patient was taken to the peripheral vascular lab. I elected not to sedate him given his age. Both groins were prepped and draped in usual sterile fashion. Under ultrasound guidance, after the skin was anesthetized, the right common femoral artery was cannulated with a micropuncture needle and a micropuncture sheath introduced over a wire. This was exchanged for a 5 [place] sheath over a [employer] wire. A pigtail catheter was positioned at the L1 vertebral body and an aortogram was obtained. The catheter was then repositioned above the aortic bifurcation and exchanged for a crossover catheter which was positioned into the proximal left common iliac artery. I then advanced the wire into the external iliac artery and exchanged the crossover catheter for a straight catheter. Selective left external iliac arterial was obtained with left lower extremity runoff at the completion of the procedure, the catheter was removed. The patient was transferred to the holding area for removal of the sheath.  FINDINGS:  1. There are single renal arteries bilaterally with no significant renal artery stenosis identified. 2, The infrarenal aorta, bilateral common iliac arteries, bilateral  internal iliac arteries, and bilateral external iliac arteries are patent. 3. On the left side, which is the site of concern, the common femoral, deep femoral, superficial femoral, popliteal, anterior tibial, and peroneal arteries are patent. The dorsalis pedis is occluded on the left. The posterior tibial artery has moderate diffuse disease and is occluded distally.  CLINICAL NOTE: The patient hasgood flow to the ankle  With distal disease in his foot and also disease in the posterior tibial artery. There are no options for revascularization.However he should have adequate circulation that his wound can be treated with elevation and compression as needed given his history of venous disease.  He will continue to follow up in the wound clinic and I'll be happy to see him back if he needs further work on his foot.  Waverly Ferrari, MD, FACS [facility] and Vein Specialists of [facility]  DATE OF DICTATION:   05/17/2015

## 2015-05-18 ENCOUNTER — Encounter (HOSPITAL_COMMUNITY): Payer: Self-pay | Admitting: Vascular Surgery

## 2015-05-18 ENCOUNTER — Encounter (HOSPITAL_BASED_OUTPATIENT_CLINIC_OR_DEPARTMENT_OTHER): Payer: Medicare Other | Attending: Surgery

## 2015-05-18 DIAGNOSIS — L97521 Non-pressure chronic ulcer of other part of left foot limited to breakdown of skin: Secondary | ICD-10-CM | POA: Insufficient documentation

## 2015-05-18 DIAGNOSIS — M1990 Unspecified osteoarthritis, unspecified site: Secondary | ICD-10-CM | POA: Diagnosis not present

## 2015-05-18 DIAGNOSIS — F0390 Unspecified dementia without behavioral disturbance: Secondary | ICD-10-CM | POA: Diagnosis not present

## 2015-05-18 DIAGNOSIS — E11319 Type 2 diabetes mellitus with unspecified diabetic retinopathy without macular edema: Secondary | ICD-10-CM | POA: Insufficient documentation

## 2015-05-18 DIAGNOSIS — E1140 Type 2 diabetes mellitus with diabetic neuropathy, unspecified: Secondary | ICD-10-CM | POA: Diagnosis not present

## 2015-05-18 DIAGNOSIS — I509 Heart failure, unspecified: Secondary | ICD-10-CM | POA: Diagnosis not present

## 2015-05-18 DIAGNOSIS — E11621 Type 2 diabetes mellitus with foot ulcer: Secondary | ICD-10-CM | POA: Diagnosis not present

## 2015-05-18 DIAGNOSIS — Z79899 Other long term (current) drug therapy: Secondary | ICD-10-CM | POA: Insufficient documentation

## 2015-05-18 DIAGNOSIS — Z950 Presence of cardiac pacemaker: Secondary | ICD-10-CM | POA: Insufficient documentation

## 2015-05-18 DIAGNOSIS — I110 Hypertensive heart disease with heart failure: Secondary | ICD-10-CM | POA: Diagnosis not present

## 2015-05-18 DIAGNOSIS — Z7982 Long term (current) use of aspirin: Secondary | ICD-10-CM | POA: Diagnosis not present

## 2015-05-18 DIAGNOSIS — M069 Rheumatoid arthritis, unspecified: Secondary | ICD-10-CM | POA: Diagnosis not present

## 2015-05-18 DIAGNOSIS — I70245 Atherosclerosis of native arteries of left leg with ulceration of other part of foot: Secondary | ICD-10-CM | POA: Insufficient documentation

## 2015-05-18 DIAGNOSIS — I2510 Atherosclerotic heart disease of native coronary artery without angina pectoris: Secondary | ICD-10-CM | POA: Diagnosis not present

## 2015-05-18 DIAGNOSIS — Z7984 Long term (current) use of oral hypoglycemic drugs: Secondary | ICD-10-CM | POA: Diagnosis not present

## 2015-05-19 ENCOUNTER — Encounter: Payer: Self-pay | Admitting: Vascular Surgery

## 2015-05-25 DIAGNOSIS — E11621 Type 2 diabetes mellitus with foot ulcer: Secondary | ICD-10-CM | POA: Diagnosis not present

## 2015-06-02 DIAGNOSIS — E11621 Type 2 diabetes mellitus with foot ulcer: Secondary | ICD-10-CM | POA: Diagnosis not present

## 2015-06-03 ENCOUNTER — Telehealth: Payer: Self-pay | Admitting: *Deleted

## 2015-06-03 NOTE — Telephone Encounter (Signed)
"  I'm calling in regards to [employer], I'm his POA.  He's scheduled for surgery on tomorrow with Dr. Ardelle Anton.  We don't know anything about what he needs to do.  You all had him scheduled for June 16.  Dr. Meyer Russel said that was too far to have him wait because he needs an amputation.  Infection is down to the bone.  He said it needs to be done right away."  He's not scheduled for surgery on tomorrow.  Dr. Ardelle Anton is seeing him for a consultation at which time he will determine if surgery is needed and if so, what type of surgery needs to be performed.  Consent forms will have to be signed. We would then have to see what day and time is available at the surgical center.  "You all don't do surgeries there?"  No, we will not be doing his surgery here if it is needed.  "Oh okay, thank you. We were under the impression it would be done tomorrow."

## 2015-06-04 ENCOUNTER — Encounter: Payer: Self-pay | Admitting: Podiatry

## 2015-06-04 ENCOUNTER — Ambulatory Visit (INDEPENDENT_AMBULATORY_CARE_PROVIDER_SITE_OTHER): Payer: Medicare Other | Admitting: Podiatry

## 2015-06-04 VITALS — BP 112/45 | HR 76 | Temp 97.8°F | Resp 18

## 2015-06-04 DIAGNOSIS — M86172 Other acute osteomyelitis, left ankle and foot: Secondary | ICD-10-CM | POA: Diagnosis not present

## 2015-06-04 MED ORDER — CLINDAMYCIN HCL 300 MG PO CAPS: 300.0000 mg | ORAL_CAPSULE | Freq: Three times a day (TID) | ORAL | Status: DC

## 2015-06-04 NOTE — Patient Instructions (Signed)
Go to the ER should there be any worsening signs or symptoms of infection Stop keflex, start clindamycin

## 2015-06-09 ENCOUNTER — Encounter: Payer: Self-pay | Admitting: Podiatry

## 2015-06-09 ENCOUNTER — Ambulatory Visit (INDEPENDENT_AMBULATORY_CARE_PROVIDER_SITE_OTHER): Payer: Medicare Other | Admitting: Podiatry

## 2015-06-09 ENCOUNTER — Ambulatory Visit: Payer: Medicare Other | Admitting: Podiatry

## 2015-06-09 DIAGNOSIS — M86172 Other acute osteomyelitis, left ankle and foot: Secondary | ICD-10-CM

## 2015-06-09 DIAGNOSIS — M86179 Other acute osteomyelitis, unspecified ankle and foot: Secondary | ICD-10-CM | POA: Insufficient documentation

## 2015-06-09 NOTE — Progress Notes (Addendum)
Subjective:     Patient ID: John [name], male   DOB: Aug 05, 1922, 82 y.o.   MRN: [id number]  HPI 82 year old male presents the office today with family members and his wife for concerns of a wound to left second toe which was referred to me by the wound care center. This has been ongoing now for several weeks and has progressed. He states me for evaluation for amputation of the toe however the family's concern by healing potential. He recently underwent angiogram with Dr. Edilia Bo.  Review of Systems  All other systems reviewed and are negative.      Objective:   Physical Exam General: AAO x3, NAD  Dermatological: On the dorsal aspect of left second toe at the PIPJ is ulceration with exposed bone. There is no drainage or pus. There is localized edema and erythema around the wound. There is no ascending cellulitis. No pus. No flexion to crepitus. No malodor. No other open lesions. There is mild edema to the feet.  Vascular: Dorsalis Pedis artery and Posterior Tibial artery pedal pulses are decreased.   Neurological : Sensation appears to be decreased however there is pain in the wound.  Musculoskeletal: No gross boney pedal deformities bilateral. No pain, crepitus, or limitation noted with foot and ankle range of motion bilateral.   Gait: Unassisted, Nonantalgic.      Assessment:     Osteomyelitis Left 2nd toe    Plan:     -Treatment options discussed including all alternatives, risks, and complications -Etiology of symptoms were discussed -Previous x-rays reviewed -Given PVD, concern for nonhealing of amputation site. Will try to discuss with vascular surgery. He will still likely need amputation but the family is also concerned about the healing potential.  -Clindamycin -The wound was debrided to granular tissue. Antibiotic ointment was applied followed by dressing. Daily dressing changes with antibiotic ointment for now. -Follow-up in 1 week or sooner. Visiting signs or  symptoms of worsening infection to the emergency room.  Per Dr. Adele Dan note: FINDINGS:  1. There are single renal arteries bilaterally with no significant renal artery stenosis identified. 2, The infrarenal aorta, bilateral common iliac arteries, bilateral internal iliac arteries, and bilateral external iliac arteries are patent. 3. On the left side, which is the site of concern, the common femoral, deep femoral, superficial femoral, popliteal, anterior tibial, and peroneal arteries are patent. The dorsalis pedis is occluded on the left. The posterior tibial artery has moderate diffuse disease and is occluded distally.  CLINICAL NOTE: The patient hasgood flow to the ankle With distal disease in his foot and also disease in the posterior tibial artery. There are no options for revascularization.However he should have adequate circulation that his wound can be treated with elevation and compression as needed given his history of venous disease. He will continue to follow up in the wound clinic and I'll be happy to see him back if he needs further work on his foot.

## 2015-06-09 NOTE — Progress Notes (Signed)
Patient ID: John [name], male   DOB: September 18, 1922, 82 y.o.   MRN: [id number]  Subjective: 82 year old male presents the office they with his wife and family member for follow-up with vibration left second toe ulcer osteomyelitis. He has continue clindamycin. The wound is no better and is unchanged compared last appointed. Denies any pus. No malodor. The wound is painful at times. Denies any systemic complaints such as fevers, chills, nausea, vomiting. No acute changes since last appointment, and no other complaints at this time.   Objective: AAO x3, NAD DP/PT pulses decreased bilaterally, CRT less than 3 seconds Continuation of wound of the dorsal aspect left second PIPJ with exposed bone. No drainage or pus. No malodor. No fluctuation or crepitus. No open lesions or pre-ulcerative lesions.  No pain with calf compression, swelling, warmth, erythema  Assessment:  osteomyelitis left second toe, PVD   Plan: -All treatment options discussed with the patient including all alternatives, risks, complications.  -Needs to have amputation second toe. Likely due this in the near future. We'll discuss with Dr. Durwin Nora prior to amputation. The patient scheduled to see me back on Monday in the appointment will likely schedule for surgery. Continue clindamycin for now. If is any signs or symptoms of worsening infection of the emergency room. Continue daily dressing changes ointme with antibiotic ointment. Call if questions or concerns.  -Patient encouraged to call the office with any questions, concerns, change in symptoms.    Ovid Curd, DPM

## 2015-06-14 ENCOUNTER — Telehealth: Payer: Self-pay | Admitting: *Deleted

## 2015-06-14 ENCOUNTER — Ambulatory Visit (INDEPENDENT_AMBULATORY_CARE_PROVIDER_SITE_OTHER): Payer: Medicare Other | Admitting: Podiatry

## 2015-06-14 ENCOUNTER — Encounter: Payer: Self-pay | Admitting: Podiatry

## 2015-06-14 VITALS — BP 141/114 | HR 71 | Temp 97.0°F | Resp 18

## 2015-06-14 DIAGNOSIS — M86172 Other acute osteomyelitis, left ankle and foot: Secondary | ICD-10-CM

## 2015-06-14 DIAGNOSIS — L97929 Non-pressure chronic ulcer of unspecified part of left lower leg with unspecified severity: Secondary | ICD-10-CM | POA: Diagnosis not present

## 2015-06-14 NOTE — Telephone Encounter (Signed)
I called and informed patient's wife that surgery will be on June 28, 2015.  "What else do I need to do?"  I reminded her again that he needs to have a physical prior to surgery and have a history and physical form filled out and faxed to [place] as well as [facility].  "I'm trying to make that appointment now."

## 2015-06-14 NOTE — Telephone Encounter (Signed)
And i need to see him next Monday prior to surgery

## 2015-06-15 ENCOUNTER — Telehealth: Payer: Self-pay | Admitting: *Deleted

## 2015-06-15 NOTE — Telephone Encounter (Signed)
This is why I am doing a pre-op appointment on Monday. I encouraged the patient and his wife to have a family member present because I could tell they did not grasp all of the information.

## 2015-06-15 NOTE — Telephone Encounter (Addendum)
Pt's granddtr, Allayne Stack Rayles called for more information on her Grandfather's appt 06/14/2015, because her grandmother had difficulty remembering all that was discussed and why pt would be coming in for a consult appt on Monday.  I told Bristy I would not be able to release that information to her, but she was welcomed to come to the appt on Monday.  Bristy states she knows that amputation of a toe is part of the discussion.  I told her I would need to discuss with Dr. Ardelle Anton and call again with the information he would allow me to release to her.  Bristy agreed. 06/16/2015-I informed Bristy, that the appt 06/21/2015 was a more indepth explanation of the surgical procedure and necessity, and post op care, if pt and family are in agreement then consents will be signed and surgery date choosen.  If at any time pt changes his mind, our office should be contacted to cancel the surgery.  Bristy states understanding.

## 2015-06-16 LAB — CUP PACEART INCLINIC DEVICE CHECK
Brady Statistic RV Percent Paced: 99 %
Implantable Lead Implant Date: 20100922
Implantable Lead Location: 753858
Lead Channel Impedance Value: 2500 Ohm
Lead Channel Impedance Value: 405 Ohm
Lead Channel Impedance Value: 820 Ohm
Lead Channel Pacing Threshold Amplitude: 1.4 V
Lead Channel Pacing Threshold Pulse Width: 0.5 ms
Lead Channel Pacing Threshold Pulse Width: 0.5 ms
Lead Channel Setting Pacing Amplitude: 2.4 V
Lead Channel Setting Sensing Sensitivity: 2.5 mV
Lead Channel Setting Sensing Sensitivity: 2.5 mV
MDC IDC LEAD IMPLANT DT: 20100922
MDC IDC LEAD LOCATION: 753860
MDC IDC MSMT LEADCHNL LV PACING THRESHOLD AMPLITUDE: 1 V
MDC IDC MSMT LEADCHNL LV PACING THRESHOLD AMPLITUDE: 1.2 V
MDC IDC MSMT LEADCHNL LV PACING THRESHOLD AMPLITUDE: 1.2 V
MDC IDC MSMT LEADCHNL LV PACING THRESHOLD PULSEWIDTH: 0.5 ms
MDC IDC MSMT LEADCHNL LV PACING THRESHOLD PULSEWIDTH: 0.5 ms
MDC IDC PG SERIAL: 111352
MDC IDC SESS DTM: 20170321040000
MDC IDC SET LEADCHNL LV PACING AMPLITUDE: 2.4 V
MDC IDC SET LEADCHNL LV PACING PULSEWIDTH: 0.5 ms
MDC IDC SET LEADCHNL RV PACING PULSEWIDTH: 0.8 ms
MDC IDC STAT BRADY RA PERCENT PACED: 0 %

## 2015-06-17 ENCOUNTER — Encounter: Payer: Self-pay | Admitting: Podiatry

## 2015-06-17 DIAGNOSIS — L97929 Non-pressure chronic ulcer of unspecified part of left lower leg with unspecified severity: Secondary | ICD-10-CM | POA: Insufficient documentation

## 2015-06-17 NOTE — Progress Notes (Signed)
Patient ID: John [name], male   DOB: 02/25/1922, 82 y.o.   MRN: [id number]  Subjective: 82 year old male presents the office they with his wife for follow-up evaluation of left second toe osteomyelitis and ulceration. The patient's wife was very hesitant deformity toe amputation therefore we have held off. She states the wound is about the same as what was the last 2 appointments and he has not had any change. Denies any systemic complaints such as fevers, chills, nausea, vomiting. No acute changes since last appointment, and no other complaints at this time.   Objective: AAO x3, NAD; sleepy but no change compared to previous appointments  DP/PT pulses decreased bilaterally, CRT less than 3 seconds Continuation of wound of the dorsal aspect left second PIPJ with exposed bone. No drainage or pus. No malodor. No fluctuation or crepitus. Chronic edema and erythema to the left foot and leg. No significant change from last appointment. No open lesions or pre-ulcerative lesions.  No pain with calf compression, swelling, warmth, erythema  Assessment:  osteomyelitis left second toe, PVD   Plan: -All treatment options discussed with the patient including all alternatives, risks, complications.  -I again today discussing amputation of the toe however the wife was unsure about this. They do not have any family members with him today. I do with this point healing is evident dictation toe given the continuation exposed bone without any healing. Discussed there are risks of doing amputation however. The patient's wife states that she is unsure what to do and the patient states that his wife's decision. I've encouraged family members to come to the office with them at the Next Monday however we'll go him schedule him for next Wednesday for an amputation. We'll do the consent at that time. Any worsening go to the ER.    Ovid Curd, DPM

## 2015-06-21 ENCOUNTER — Encounter: Payer: Self-pay | Admitting: Podiatry

## 2015-06-21 ENCOUNTER — Encounter: Payer: Self-pay | Admitting: *Deleted

## 2015-06-21 ENCOUNTER — Telehealth: Payer: Self-pay | Admitting: *Deleted

## 2015-06-21 ENCOUNTER — Ambulatory Visit (INDEPENDENT_AMBULATORY_CARE_PROVIDER_SITE_OTHER): Payer: Medicare Other | Admitting: Podiatry

## 2015-06-21 DIAGNOSIS — M86172 Other acute osteomyelitis, left ankle and foot: Secondary | ICD-10-CM | POA: Diagnosis not present

## 2015-06-21 DIAGNOSIS — L97929 Non-pressure chronic ulcer of unspecified part of left lower leg with unspecified severity: Secondary | ICD-10-CM | POA: Diagnosis not present

## 2015-06-21 NOTE — Telephone Encounter (Signed)
I left a message for Judeth Cornfield at Dr. Adele Dan office inquiring about medical clearance.  I sent a request for clearance on 06/10/2015.  Patient is scheduled for surgery on 06/28/2015.  Please advise.

## 2015-06-23 ENCOUNTER — Encounter (HOSPITAL_COMMUNITY)
Admission: RE | Admit: 2015-06-23 | Discharge: 2015-06-23 | Disposition: A | Discharge status: Home / Self Care | Payer: Medicare Other | Source: Ambulatory Visit | Attending: Podiatry | Admitting: Podiatry

## 2015-06-23 ENCOUNTER — Encounter (HOSPITAL_COMMUNITY): Payer: Self-pay

## 2015-06-23 ENCOUNTER — Telehealth: Payer: Self-pay | Admitting: *Deleted

## 2015-06-23 DIAGNOSIS — Z8673 Personal history of transient ischemic attack (TIA), and cerebral infarction without residual deficits: Secondary | ICD-10-CM | POA: Diagnosis not present

## 2015-06-23 DIAGNOSIS — Z950 Presence of cardiac pacemaker: Secondary | ICD-10-CM | POA: Diagnosis not present

## 2015-06-23 DIAGNOSIS — Z01812 Encounter for preprocedural laboratory examination: Secondary | ICD-10-CM | POA: Insufficient documentation

## 2015-06-23 DIAGNOSIS — I110 Hypertensive heart disease with heart failure: Secondary | ICD-10-CM | POA: Diagnosis not present

## 2015-06-23 DIAGNOSIS — E785 Hyperlipidemia, unspecified: Secondary | ICD-10-CM | POA: Diagnosis not present

## 2015-06-23 DIAGNOSIS — Z951 Presence of aortocoronary bypass graft: Secondary | ICD-10-CM | POA: Diagnosis not present

## 2015-06-23 DIAGNOSIS — I509 Heart failure, unspecified: Secondary | ICD-10-CM | POA: Diagnosis not present

## 2015-06-23 DIAGNOSIS — Z79899 Other long term (current) drug therapy: Secondary | ICD-10-CM | POA: Diagnosis not present

## 2015-06-23 DIAGNOSIS — Z01818 Encounter for other preprocedural examination: Secondary | ICD-10-CM | POA: Insufficient documentation

## 2015-06-23 DIAGNOSIS — Z7984 Long term (current) use of oral hypoglycemic drugs: Secondary | ICD-10-CM | POA: Diagnosis not present

## 2015-06-23 DIAGNOSIS — M869 Osteomyelitis, unspecified: Secondary | ICD-10-CM | POA: Diagnosis not present

## 2015-06-23 DIAGNOSIS — I2510 Atherosclerotic heart disease of native coronary artery without angina pectoris: Secondary | ICD-10-CM | POA: Insufficient documentation

## 2015-06-23 DIAGNOSIS — Z7982 Long term (current) use of aspirin: Secondary | ICD-10-CM | POA: Insufficient documentation

## 2015-06-23 DIAGNOSIS — E119 Type 2 diabetes mellitus without complications: Secondary | ICD-10-CM | POA: Insufficient documentation

## 2015-06-23 DIAGNOSIS — I4891 Unspecified atrial fibrillation: Secondary | ICD-10-CM | POA: Diagnosis not present

## 2015-06-23 DIAGNOSIS — F0390 Unspecified dementia without behavioral disturbance: Secondary | ICD-10-CM | POA: Insufficient documentation

## 2015-06-23 HISTORY — DX: Presence of cardiac pacemaker: Z95.0

## 2015-06-23 HISTORY — DX: Headache: R51

## 2015-06-23 HISTORY — DX: Cardiac arrhythmia, unspecified: I49.9

## 2015-06-23 HISTORY — DX: Headache, unspecified: R51.9

## 2015-06-23 HISTORY — DX: Presence of automatic (implantable) cardiac defibrillator: Z95.810

## 2015-06-23 HISTORY — DX: Unspecified osteoarthritis, unspecified site: M19.90

## 2015-06-23 LAB — BASIC METABOLIC PANEL
Anion gap: 6 (ref 5–15)
BUN: 13 mg/dL (ref 6–20)
CALCIUM: 9 mg/dL (ref 8.9–10.3)
CO2: 27 mmol/L (ref 22–32)
CREATININE: 1.06 mg/dL (ref 0.61–1.24)
Chloride: 108 mmol/L (ref 101–111)
GFR calc non Af Amer: 60 mL/min — ABNORMAL LOW (ref >60)
GLUCOSE: 157 mg/dL — AB (ref 65–99)
Potassium: 3.9 mmol/L (ref 3.5–5.1)
Sodium: 141 mmol/L (ref 135–145)

## 2015-06-23 LAB — CBC
HCT: 34 % — ABNORMAL LOW (ref 39.0–52.0)
Hemoglobin: 10.5 g/dL — ABNORMAL LOW (ref 13.0–17.0)
MCH: 31.5 pg (ref 26.0–34.0)
MCHC: 30.9 g/dL (ref 30.0–36.0)
MCV: 102.1 fL — ABNORMAL HIGH (ref 78.0–100.0)
PLATELETS: 180 10*3/uL (ref 150–400)
RBC: 3.33 MIL/uL — ABNORMAL LOW (ref 4.22–5.81)
RDW: 14.6 % (ref 11.5–15.5)
WBC: 3.7 10*3/uL — ABNORMAL LOW (ref 4.0–10.5)

## 2015-06-23 LAB — GLUCOSE, CAPILLARY: Glucose-Capillary: 167 mg/dL — ABNORMAL HIGH (ref 65–99)

## 2015-06-23 NOTE — Telephone Encounter (Signed)
Clinical notes were faxed to Dr. Edilia Bo again today.  Medical clearance is requested.  Surgery is scheduled for 06/28/2015 for Amputation 2nd Toe w/ MPJ Joint left foot.  I am waiting on response from Dr. Edilia Bo.  Dr. Edilia Bo emailed a medical clearance letter to Dr. Ardelle Anton.

## 2015-06-23 NOTE — Telephone Encounter (Signed)
done

## 2015-06-23 NOTE — Telephone Encounter (Signed)
"  We need orders for this patient.  He's scheduled for surgery on Monday."  I'll let Dr. Ardelle Anton know.

## 2015-06-23 NOTE — Progress Notes (Signed)
Patient ID: John [name], male   DOB: November 11, 1922, 82 y.o.   MRN: [id number]  Subjective: 82 year old male presents the office they with his wife and multiple family members today for follow-up evaluation of left second toe osteomyelitis and ulceration and to discuss amputation. The patient's wife states the toe is been the same and there is been no change. Denies any pus. Wound appears to be the same. Denies any systemic complaints such as fevers, chills, nausea, vomiting. No acute changes since last appointment, and no other complaints at this time.   Objective: AAO x3, NAD; sleepy but no change compared to previous appointments  DP/PT pulses decreased bilaterally, CRT less than 3 seconds Continuation of wound of the dorsal aspect left second PIPJ with exposed bone. No drainage or pus. No malodor. No fluctuation or crepitus. Chronic edema and erythema to the left foot and leg. No significant change from last appointment. No open lesions or pre-ulcerative lesions.  No pain with calf compression, swelling, warmth, erythema  Assessment: Osteomyelitis left second toe, PVD   Plan: -All treatment options discussed with the patient including all alternatives, risks, complications.  -I had a very long discussion today with the patient and multiple family members who accompanied him today in regards to treatment options. I discussed both limb salvage as well as amputation of the toe. I discussed pros and cons of each. At this point he has been on antibiotics for quite some time as well as undergoing local wound care and the wound has not progressed and a good way. At this point I do recommend amputation to help prevent spread of infection. I sat down with the patient's family and answered all her questions to the best of my ability. I discussed with them risks of surgery. I answered each of their questions. -At this time agreed to left second toe, possible partial ray amputation. -The incision placement  as well as the postoperative course was discussed with the patient. I discussed risks of the surgery which include, but not limited to, infection, bleeding, pain, swelling, need for further surgery, delayed or nonhealing, painful or ugly scar, numbness or sensation changes, over/under correction, recurrence, transfer lesions, further deformity, hardware failure, DVT/PE, loss of toe/foot. Patient understands these risks and wishes to proceed with surgery. The surgical consent was reviewed with the patient all 3 pages were signed. No promises or guarantees were given to the outcome of the procedure. All questions were answered to the best of my ability. Before the surgery the patient was encouraged to call the office if there is any further questions. The surgery will be performed at [facility]. Will hopefully be a SDA.   Ovid Curd, DPM

## 2015-06-23 NOTE — Pre-Procedure Instructions (Addendum)
John [name]  06/23/2015      [facility]ORE [address] [address], [place] - [address] AT [facility] [address] [place] [address] Phone: [phone] Fax: [phone]    Your procedure is scheduled on 06/28/15  Report to [facility] Admitting at 530 A.M.  Call this number if you have problems the morning of surgery:  [phone]   Remember:  Do not eat food or drink liquids after midnight.  Take these medicines the morning of surgery with A SIP OF WATER amiodarone,carvedilol(coreg) lexapro, namenda,          (exelon(rivastigmine)Remove 24 hrs before surgery 06/26/15  STOP all herbel meds, nsaids (aleve,naproxen,advil,ibuprofen) 5 days prior to surgery including aspirin, vitamins  No metformin day of surgery   Do not wear jewelry, make-up or nail polish.  Do not wear lotions, powders, or perfumes.  You may wear deodorant.  Do not shave 48 hours prior to surgery.  Men may shave face and neck.  Do not bring valuables to the hospital.  [facility] is not responsible for any belongings or valuables.  Contacts, dentures or bridgework may not be worn into surgery.  Leave your suitcase in the car.  After surgery it may be brought to your room.  For patients admitted to the hospital, discharge time will be determined by your treatment team.  Patients discharged the day of surgery will not be allowed to drive home.   Name and phone number of your driver:  Special instructions:   Special Instructions: [place] - Preparing for Surgery  Before surgery, you can play an important role.  Because skin is not sterile, your skin needs to be as free of germs as possible.  You can reduce the number of germs on you skin by washing with CHG (chlorahexidine gluconate) soap before surgery.  CHG is an antiseptic cleaner which kills germs and bonds with the skin to continue killing germs even after washing.  Please DO NOT use if you  have an allergy to CHG or antibacterial soaps.  If your skin becomes reddened/irritated stop using the CHG and inform your nurse when you arrive at Short Stay.  Do not shave (including legs and underarms) for at least 48 hours prior to the first CHG shower.  You may shave your face.  Please follow these instructions carefully:   1.  Shower with CHG Soap the night before surgery and the morning of Surgery.  2.  If you choose to wash your hair, wash your hair first as usual with your normal shampoo.  3.  After you shampoo, rinse your hair and body thoroughly to remove the Shampoo.  4.  Use CHG as you would any other liquid soap.  You can apply chg directly  to the skin and wash gently with scrungie or a clean washcloth.  5.  Apply the CHG Soap to your body ONLY FROM THE NECK DOWN.  Do not use on open wounds or open sores.  Avoid contact with your eyes ears, mouth and genitals (private parts).  Wash genitals (private parts)       with your normal soap.  6.  Wash thoroughly, paying special attention to the area where your surgery will be performed.  7.  Thoroughly rinse your body with warm water from the neck down.  8.  DO NOT shower/wash with your normal soap after using and rinsing off the CHG  Soap.  9.  Pat yourself dry with a clean towel.            10.  Wear clean pajamas.            11.  Place clean sheets on your bed the night of your first shower and do not sleep with pets.  Day of Surgery  Do not apply any lotions/deodorants the morning of surgery.  Please wear clean clothes to the hospital/surgery center.  Please read over the following fact sheets that you were given.

## 2015-06-24 LAB — HEMOGLOBIN A1C
Hgb A1c MFr Bld: 6 % — ABNORMAL HIGH (ref 4.8–5.6)
Mean Plasma Glucose: 126 mg/dL

## 2015-06-24 NOTE — Progress Notes (Signed)
Anesthesia Chart Review:  Pt is a 82 year old male scheduled for L 2nd toe amputation with Dr. Ovid Curd, DPM on 06/28/2015. Anesthesia type is listed as MAC.   PCP is Dr. Rodrigo Ran, H&P for surgery from last office visit 06/16/15 on paper chart.   PMH includes:  CAD (s/p CABG 2008: LIMA to LAD, SVG to OM, SVG to PD-PL), atrial fibrillation, ventricular tachycardia, CHF, pacemaker, carotid artery occlusion, HTN, DM, hyperlipidemia, stroke, dementia, subdural hematoma (2005, pt not candidate for anticoagulation). Never smoker. BMI 35  Medications include: amiodarone, ASA, carvedilol, metformin, namenda, pravastatin, exelon patch. Pt instructed to stop exelon patch 24 hrs before surgery.   Preoperative labs reviewed.  HgbA1c 6.0, glucose 157  EKG 03/30/15: electronic ventricular pacemaker  Echo 03/01/09:  - Left ventricle: The cavity size was normal. Wall thickness was increased in a pattern of mild LVH. Systolic function was mildly to moderately reduced. The estimated ejection fraction was in the range of 40% to 45%. Moderate hypokinesis of the inferior myocardium. - Aortic valve: Trivial regurgitation. - Mitral valve: Calcified annulus. Mild regurgitation. - Left atrium: The atrium was moderately dilated. - Right atrium: The atrium was dilated.  Cardiac cath 09/28/08:  1. Patent LIMA and SVG grafts.  2. Potential sites of ischemia involved with distal LAD and a unbypassed diagonal branch. Attempted PCI to LAD unsuccessful 3. Abnormal LV function with increased LVEDP  EP cardiologist is Dr. Sherryl Manges, last office visit 03/26/15. Cardiologist is Dr. Viann Fish, last office visit 2 years ago.   Perioperative pacemaker prescription form notes pt is pacemaker dependent but procedure should not interfere with pacemaker.   Reviewed case with Dr. Mable Fill.   If no changes, I anticipate pt can proceed with surgery as scheduled.   Rica Mast, FNP-BC [facility] Short Stay Surgical  Center/Anesthesiology Phone: [phone] 06/24/2015 11:35 AM

## 2015-06-27 MED ORDER — DEXTROSE 5 % IV SOLN
3.0000 g | INTRAVENOUS | Status: AC
  Administered 2015-06-28: 3 g via INTRAVENOUS
  Filled 2015-06-27 (×4): qty 3000

## 2015-06-28 ENCOUNTER — Encounter (HOSPITAL_COMMUNITY)
Admission: RE | Disposition: A | Discharge status: Skilled Nursing Facility | Payer: Self-pay | Source: Ambulatory Visit | Attending: Internal Medicine

## 2015-06-28 ENCOUNTER — Encounter: Payer: Self-pay | Admitting: Podiatry

## 2015-06-28 ENCOUNTER — Ambulatory Visit (HOSPITAL_COMMUNITY): Payer: Medicare Other | Admitting: Anesthesiology

## 2015-06-28 ENCOUNTER — Ambulatory Visit (HOSPITAL_COMMUNITY): Payer: Medicare Other | Admitting: Emergency Medicine

## 2015-06-28 ENCOUNTER — Encounter (HOSPITAL_COMMUNITY): Payer: Self-pay | Admitting: Anesthesiology

## 2015-06-28 ENCOUNTER — Observation Stay (HOSPITAL_COMMUNITY)
Admission: RE | Admit: 2015-06-28 | Discharge: 2015-07-01 | Disposition: A | Discharge status: Skilled Nursing Facility | Payer: Medicare Other | Source: Ambulatory Visit | Attending: Internal Medicine | Admitting: Internal Medicine

## 2015-06-28 DIAGNOSIS — R262 Difficulty in walking, not elsewhere classified: Secondary | ICD-10-CM | POA: Diagnosis not present

## 2015-06-28 DIAGNOSIS — Z8614 Personal history of Methicillin resistant Staphylococcus aureus infection: Secondary | ICD-10-CM | POA: Diagnosis not present

## 2015-06-28 DIAGNOSIS — Z09 Encounter for follow-up examination after completed treatment for conditions other than malignant neoplasm: Secondary | ICD-10-CM

## 2015-06-28 DIAGNOSIS — Z955 Presence of coronary angioplasty implant and graft: Secondary | ICD-10-CM | POA: Insufficient documentation

## 2015-06-28 DIAGNOSIS — Z79899 Other long term (current) drug therapy: Secondary | ICD-10-CM | POA: Insufficient documentation

## 2015-06-28 DIAGNOSIS — Z950 Presence of cardiac pacemaker: Secondary | ICD-10-CM | POA: Insufficient documentation

## 2015-06-28 DIAGNOSIS — M868X4 Other osteomyelitis, hand: Secondary | ICD-10-CM | POA: Diagnosis present

## 2015-06-28 DIAGNOSIS — Z8673 Personal history of transient ischemic attack (TIA), and cerebral infarction without residual deficits: Secondary | ICD-10-CM | POA: Insufficient documentation

## 2015-06-28 DIAGNOSIS — Z794 Long term (current) use of insulin: Secondary | ICD-10-CM | POA: Insufficient documentation

## 2015-06-28 DIAGNOSIS — E118 Type 2 diabetes mellitus with unspecified complications: Secondary | ICD-10-CM | POA: Insufficient documentation

## 2015-06-28 DIAGNOSIS — E785 Hyperlipidemia, unspecified: Secondary | ICD-10-CM | POA: Diagnosis not present

## 2015-06-28 DIAGNOSIS — I5022 Chronic systolic (congestive) heart failure: Secondary | ICD-10-CM | POA: Diagnosis not present

## 2015-06-28 DIAGNOSIS — I110 Hypertensive heart disease with heart failure: Secondary | ICD-10-CM | POA: Diagnosis not present

## 2015-06-28 DIAGNOSIS — I509 Heart failure, unspecified: Secondary | ICD-10-CM | POA: Diagnosis not present

## 2015-06-28 DIAGNOSIS — L0390 Cellulitis, unspecified: Secondary | ICD-10-CM | POA: Diagnosis present

## 2015-06-28 DIAGNOSIS — E119 Type 2 diabetes mellitus without complications: Secondary | ICD-10-CM | POA: Insufficient documentation

## 2015-06-28 DIAGNOSIS — E11621 Type 2 diabetes mellitus with foot ulcer: Secondary | ICD-10-CM | POA: Diagnosis not present

## 2015-06-28 DIAGNOSIS — N400 Enlarged prostate without lower urinary tract symptoms: Secondary | ICD-10-CM | POA: Insufficient documentation

## 2015-06-28 DIAGNOSIS — I4891 Unspecified atrial fibrillation: Secondary | ICD-10-CM | POA: Insufficient documentation

## 2015-06-28 DIAGNOSIS — L03116 Cellulitis of left lower limb: Secondary | ICD-10-CM | POA: Insufficient documentation

## 2015-06-28 DIAGNOSIS — I10 Essential (primary) hypertension: Secondary | ICD-10-CM | POA: Diagnosis present

## 2015-06-28 DIAGNOSIS — I2510 Atherosclerotic heart disease of native coronary artery without angina pectoris: Secondary | ICD-10-CM | POA: Diagnosis not present

## 2015-06-28 DIAGNOSIS — M86679 Other chronic osteomyelitis, unspecified ankle and foot: Secondary | ICD-10-CM | POA: Diagnosis not present

## 2015-06-28 DIAGNOSIS — R531 Weakness: Secondary | ICD-10-CM | POA: Insufficient documentation

## 2015-06-28 DIAGNOSIS — F0150 Vascular dementia without behavioral disturbance: Secondary | ICD-10-CM | POA: Diagnosis present

## 2015-06-28 DIAGNOSIS — F0390 Unspecified dementia without behavioral disturbance: Secondary | ICD-10-CM | POA: Diagnosis not present

## 2015-06-28 HISTORY — PX: TOE AMPUTATION: SHX809

## 2015-06-28 HISTORY — PX: AMPUTATION: SHX166

## 2015-06-28 LAB — CBC WITH DIFFERENTIAL/PLATELET
BASOS PCT: 1 %
Basophils Absolute: 0 10*3/uL (ref 0.0–0.1)
EOS ABS: 0.1 10*3/uL (ref 0.0–0.7)
EOS PCT: 3 %
HCT: 32.6 % — ABNORMAL LOW (ref 39.0–52.0)
HEMOGLOBIN: 10.1 g/dL — AB (ref 13.0–17.0)
LYMPHS PCT: 28 %
Lymphs Abs: 1.3 10*3/uL (ref 0.7–4.0)
MCH: 31.6 pg (ref 26.0–34.0)
MCHC: 31 g/dL (ref 30.0–36.0)
MCV: 101.9 fL — ABNORMAL HIGH (ref 78.0–100.0)
MONO ABS: 0.5 10*3/uL (ref 0.1–1.0)
Monocytes Relative: 12 %
NEUTROS PCT: 56 %
Neutro Abs: 2.6 10*3/uL (ref 1.7–7.7)
PLATELETS: 140 10*3/uL — AB (ref 150–400)
RBC: 3.2 MIL/uL — AB (ref 4.22–5.81)
RDW: 14.6 % (ref 11.5–15.5)
WBC: 4.5 10*3/uL (ref 4.0–10.5)

## 2015-06-28 LAB — BASIC METABOLIC PANEL
ANION GAP: 6 (ref 5–15)
BUN: 15 mg/dL (ref 6–20)
CALCIUM: 8.7 mg/dL — AB (ref 8.9–10.3)
CHLORIDE: 109 mmol/L (ref 101–111)
CO2: 26 mmol/L (ref 22–32)
CREATININE: 1.1 mg/dL (ref 0.61–1.24)
GFR calc non Af Amer: 57 mL/min — ABNORMAL LOW (ref >60)
Glucose, Bld: 114 mg/dL — ABNORMAL HIGH (ref 65–99)
Potassium: 3.8 mmol/L (ref 3.5–5.1)
SODIUM: 141 mmol/L (ref 135–145)

## 2015-06-28 LAB — GLUCOSE, CAPILLARY
GLUCOSE-CAPILLARY: 128 mg/dL — AB (ref 65–99)
GLUCOSE-CAPILLARY: 108 mg/dL — AB (ref 65–99)
GLUCOSE-CAPILLARY: 121 mg/dL — AB (ref 65–99)
Glucose-Capillary: 96 mg/dL (ref 65–99)
Glucose-Capillary: 178 mg/dL — ABNORMAL HIGH (ref 65–99)

## 2015-06-28 LAB — C-REACTIVE PROTEIN: CRP: 1.9 mg/dL — ABNORMAL HIGH (ref <1.0)

## 2015-06-28 LAB — SEDIMENTATION RATE: SED RATE: 45 mm/h — AB (ref 0–16)

## 2015-06-28 LAB — PREALBUMIN: PREALBUMIN: 16.1 mg/dL — AB (ref 18–38)

## 2015-06-28 SURGERY — AMPUTATION, FOOT, RAY
Anesthesia: Monitor Anesthesia Care | Site: Foot | Laterality: Left

## 2015-06-28 MED ORDER — ONDANSETRON HCL 4 MG PO TABS: 4.0000 mg | ORAL_TABLET | Freq: Four times a day (QID) | ORAL | Status: DC | PRN

## 2015-06-28 MED ORDER — VITAMIN D 1000 UNITS PO TABS
1000.0000 [IU] | ORAL_TABLET | Freq: Every day | ORAL | Status: DC
  Administered 2015-06-29 – 2015-07-01 (×3): 1000 [IU] via ORAL
  Filled 2015-06-28 (×3): qty 1

## 2015-06-28 MED ORDER — ESCITALOPRAM OXALATE 10 MG PO TABS
10.0000 mg | ORAL_TABLET | Freq: Every day | ORAL | Status: DC
  Administered 2015-06-29 – 2015-07-01 (×3): 10 mg via ORAL
  Filled 2015-06-28 (×3): qty 1

## 2015-06-28 MED ORDER — RIVASTIGMINE 9.5 MG/24HR TD PT24
9.5000 mg | MEDICATED_PATCH | Freq: Every day | TRANSDERMAL | Status: DC
  Administered 2015-06-28 – 2015-07-01 (×4): 9.5 mg via TRANSDERMAL
  Filled 2015-06-28 (×5): qty 1

## 2015-06-28 MED ORDER — VANCOMYCIN HCL 10 G IV SOLR
1250.0000 mg | INTRAVENOUS | Status: DC
  Administered 2015-06-29 – 2015-06-30 (×2): 1250 mg via INTRAVENOUS
  Filled 2015-06-28 (×3): qty 1250

## 2015-06-28 MED ORDER — OXYCODONE HCL 5 MG PO TABS: 5.0000 mg | ORAL_TABLET | Freq: Once | ORAL | Status: DC | PRN

## 2015-06-28 MED ORDER — ONDANSETRON HCL 4 MG/2ML IJ SOLN: 4.0000 mg | Freq: Once | INTRAMUSCULAR | Status: DC | PRN

## 2015-06-28 MED ORDER — SENNOSIDES-DOCUSATE SODIUM 8.6-50 MG PO TABS: 1.0000 | ORAL_TABLET | Freq: Every evening | ORAL | Status: DC | PRN

## 2015-06-28 MED ORDER — DEXTROSE 5 % IV SOLN
1.0000 g | Freq: Three times a day (TID) | INTRAVENOUS | Status: DC
  Administered 2015-06-28 – 2015-07-01 (×8): 1 g via INTRAVENOUS
  Filled 2015-06-28 (×11): qty 1

## 2015-06-28 MED ORDER — CARVEDILOL 6.25 MG PO TABS
6.2500 mg | ORAL_TABLET | Freq: Two times a day (BID) | ORAL | Status: DC
  Administered 2015-06-28 – 2015-07-01 (×6): 6.25 mg via ORAL
  Filled 2015-06-28 (×6): qty 1

## 2015-06-28 MED ORDER — FENTANYL CITRATE (PF) 100 MCG/2ML IJ SOLN: 25.0000 ug | INTRAMUSCULAR | Status: DC | PRN

## 2015-06-28 MED ORDER — PHENYLEPHRINE 40 MCG/ML (10ML) SYRINGE FOR IV PUSH (FOR BLOOD PRESSURE SUPPORT)
PREFILLED_SYRINGE | INTRAVENOUS | Status: AC
  Filled 2015-06-28: qty 10

## 2015-06-28 MED ORDER — 0.9 % SODIUM CHLORIDE (POUR BTL) OPTIME
TOPICAL | Status: DC | PRN
  Administered 2015-06-28: 1000 mL

## 2015-06-28 MED ORDER — CEFTRIAXONE SODIUM 2 G IJ SOLR: 2.0000 g | INTRAMUSCULAR | Status: DC

## 2015-06-28 MED ORDER — MORPHINE SULFATE (PF) 2 MG/ML IV SOLN
1.0000 mg | INTRAVENOUS | Status: DC | PRN
  Administered 2015-06-28: 1 mg via INTRAVENOUS
  Filled 2015-06-28: qty 1

## 2015-06-28 MED ORDER — FENTANYL CITRATE (PF) 100 MCG/2ML IJ SOLN
INTRAMUSCULAR | Status: DC | PRN
  Administered 2015-06-28: 25 ug via INTRAVENOUS

## 2015-06-28 MED ORDER — VITAMIN D (ERGOCALCIFEROL) 1.25 MG (50000 UNIT) PO CAPS: 50000.0000 [IU] | ORAL_CAPSULE | ORAL | Status: DC

## 2015-06-28 MED ORDER — ACETAMINOPHEN 650 MG RE SUPP: 650.0000 mg | Freq: Four times a day (QID) | RECTAL | Status: DC | PRN

## 2015-06-28 MED ORDER — PROPOFOL 500 MG/50ML IV EMUL
INTRAVENOUS | Status: DC | PRN
  Administered 2015-06-28: 40 ug/kg/min via INTRAVENOUS

## 2015-06-28 MED ORDER — PRAVASTATIN SODIUM 40 MG PO TABS
40.0000 mg | ORAL_TABLET | Freq: Every day | ORAL | Status: DC
  Administered 2015-06-28 – 2015-07-01 (×4): 40 mg via ORAL
  Filled 2015-06-28 (×4): qty 1

## 2015-06-28 MED ORDER — INSULIN ASPART 100 UNIT/ML ~~LOC~~ SOLN: 0.0000 [IU] | Freq: Every day | SUBCUTANEOUS | Status: DC

## 2015-06-28 MED ORDER — DEXAMETHASONE SODIUM PHOSPHATE 10 MG/ML IJ SOLN
INTRAMUSCULAR | Status: AC
  Filled 2015-06-28: qty 1

## 2015-06-28 MED ORDER — PHENYLEPHRINE HCL 10 MG/ML IJ SOLN
INTRAMUSCULAR | Status: DC | PRN
  Administered 2015-06-28 (×2): 40 ug via INTRAVENOUS

## 2015-06-28 MED ORDER — CHLORHEXIDINE GLUCONATE CLOTH 2 % EX PADS: 6.0000 | MEDICATED_PAD | Freq: Once | CUTANEOUS | Status: DC

## 2015-06-28 MED ORDER — PROPOFOL 10 MG/ML IV BOLUS
INTRAVENOUS | Status: DC | PRN
  Administered 2015-06-28: 20 mg via INTRAVENOUS

## 2015-06-28 MED ORDER — LIDOCAINE HCL (CARDIAC) 20 MG/ML IV SOLN
INTRAVENOUS | Status: DC | PRN
  Administered 2015-06-28: 40 mg via INTRAVENOUS

## 2015-06-28 MED ORDER — HYDROCODONE-ACETAMINOPHEN 5-325 MG PO TABS
1.0000 | ORAL_TABLET | ORAL | Status: DC | PRN
  Administered 2015-06-28 – 2015-06-29 (×2): 1 via ORAL
  Administered 2015-06-30 (×2): 2 via ORAL
  Filled 2015-06-28: qty 2
  Filled 2015-06-28: qty 1
  Filled 2015-06-28: qty 2
  Filled 2015-06-28: qty 1

## 2015-06-28 MED ORDER — PROPOFOL 10 MG/ML IV BOLUS
INTRAVENOUS | Status: AC
  Filled 2015-06-28: qty 20

## 2015-06-28 MED ORDER — ONDANSETRON HCL 4 MG/2ML IJ SOLN: 4.0000 mg | Freq: Four times a day (QID) | INTRAMUSCULAR | Status: DC | PRN

## 2015-06-28 MED ORDER — MIDAZOLAM HCL 2 MG/2ML IJ SOLN
INTRAMUSCULAR | Status: AC
  Filled 2015-06-28: qty 2

## 2015-06-28 MED ORDER — ACETAMINOPHEN 325 MG PO TABS: 650.0000 mg | ORAL_TABLET | Freq: Four times a day (QID) | ORAL | Status: DC | PRN

## 2015-06-28 MED ORDER — ONDANSETRON HCL 4 MG/2ML IJ SOLN
INTRAMUSCULAR | Status: AC
  Filled 2015-06-28: qty 2

## 2015-06-28 MED ORDER — LACTATED RINGERS IV SOLN
INTRAVENOUS | Status: DC | PRN
  Administered 2015-06-28 (×2): via INTRAVENOUS

## 2015-06-28 MED ORDER — MEMANTINE HCL ER 28 MG PO CP24
28.0000 mg | ORAL_CAPSULE | Freq: Every day | ORAL | Status: DC
  Administered 2015-06-29 – 2015-07-01 (×3): 28 mg via ORAL
  Filled 2015-06-28 (×3): qty 1

## 2015-06-28 MED ORDER — LIDOCAINE 2% (20 MG/ML) 5 ML SYRINGE
INTRAMUSCULAR | Status: AC
  Filled 2015-06-28: qty 5

## 2015-06-28 MED ORDER — OXYCODONE HCL 5 MG/5ML PO SOLN: 5.0000 mg | Freq: Once | ORAL | Status: DC | PRN

## 2015-06-28 MED ORDER — DEXTROSE 5 % IV SOLN
2.0000 g | Freq: Once | INTRAVENOUS | Status: AC
  Administered 2015-06-28: 2 g via INTRAVENOUS
  Filled 2015-06-28: qty 2

## 2015-06-28 MED ORDER — ONDANSETRON HCL 4 MG/2ML IJ SOLN
INTRAMUSCULAR | Status: DC | PRN
  Administered 2015-06-28: 4 mg via INTRAVENOUS

## 2015-06-28 MED ORDER — METRONIDAZOLE IN NACL 5-0.79 MG/ML-% IV SOLN: 500.0000 mg | Freq: Three times a day (TID) | INTRAVENOUS | Status: DC

## 2015-06-28 MED ORDER — FENTANYL CITRATE (PF) 250 MCG/5ML IJ SOLN
INTRAMUSCULAR | Status: AC
  Filled 2015-06-28: qty 5

## 2015-06-28 MED ORDER — SODIUM CHLORIDE 0.9 % IV SOLN
2000.0000 mg | Freq: Once | INTRAVENOUS | Status: AC
  Administered 2015-06-28: 2000 mg via INTRAVENOUS
  Filled 2015-06-28: qty 2000

## 2015-06-28 MED ORDER — INSULIN ASPART 100 UNIT/ML ~~LOC~~ SOLN
0.0000 [IU] | Freq: Three times a day (TID) | SUBCUTANEOUS | Status: DC
Start: 2015-06-28 — End: 2015-07-01
  Administered 2015-06-28: 1 [IU] via SUBCUTANEOUS
  Administered 2015-06-29: 3 [IU] via SUBCUTANEOUS
  Administered 2015-06-29 – 2015-07-01 (×4): 2 [IU] via SUBCUTANEOUS

## 2015-06-28 MED ORDER — AMIODARONE HCL 200 MG PO TABS
200.0000 mg | ORAL_TABLET | Freq: Every day | ORAL | Status: DC
  Administered 2015-06-29 – 2015-07-01 (×3): 200 mg via ORAL
  Filled 2015-06-28 (×3): qty 1

## 2015-06-28 MED ORDER — SODIUM CHLORIDE 0.9 % IV SOLN: INTRAVENOUS | Status: AC

## 2015-06-28 SURGICAL SUPPLY — 46 items
BANDAGE ACE 4X5 VEL STRL LF (GAUZE/BANDAGES/DRESSINGS) ×2 IMPLANT
BANDAGE ESMARK 6X9 LF (GAUZE/BANDAGES/DRESSINGS) IMPLANT
BLADE SURG 10 STRL SS (BLADE) ×3 IMPLANT
BNDG CMPR 9X6 STRL LF SNTH (GAUZE/BANDAGES/DRESSINGS)
BNDG COHESIVE 4X5 TAN STRL (GAUZE/BANDAGES/DRESSINGS) ×3 IMPLANT
BNDG ESMARK 6X9 LF (GAUZE/BANDAGES/DRESSINGS)
BNDG GAUZE ELAST 4 BULKY (GAUZE/BANDAGES/DRESSINGS) ×2 IMPLANT
CUFF TOURNIQUET SINGLE 34IN LL (TOURNIQUET CUFF) IMPLANT
CUFF TOURNIQUET SINGLE 44IN (TOURNIQUET CUFF) IMPLANT
DRAPE U-SHAPE 47X51 STRL (DRAPES) ×6 IMPLANT
DURAPREP 26ML APPLICATOR (WOUND CARE) ×3 IMPLANT
ELECT REM PT RETURN 9FT ADLT (ELECTROSURGICAL) ×3
ELECTRODE REM PT RTRN 9FT ADLT (ELECTROSURGICAL) ×1 IMPLANT
GAUZE SPONGE 4X4 12PLY STRL (GAUZE/BANDAGES/DRESSINGS) ×3 IMPLANT
GAUZE XEROFORM 5X9 LF (GAUZE/BANDAGES/DRESSINGS) ×3 IMPLANT
GLOVE BIO SURGEON STRL SZ8 (GLOVE) ×3 IMPLANT
GLOVE BIOGEL PI IND STRL 8 (GLOVE) ×1 IMPLANT
GLOVE BIOGEL PI INDICATOR 8 (GLOVE) ×2
GLOVE ORTHO TXT STRL SZ7.5 (GLOVE) ×3 IMPLANT
GLOVE SS BIOGEL STRL SZ 7.5 (GLOVE) IMPLANT
GLOVE SUPERSENSE BIOGEL SZ 7.5 (GLOVE) ×2
GOWN STRL REUS W/ TWL LRG LVL3 (GOWN DISPOSABLE) ×1 IMPLANT
GOWN STRL REUS W/ TWL XL LVL3 (GOWN DISPOSABLE) ×4 IMPLANT
GOWN STRL REUS W/TWL LRG LVL3 (GOWN DISPOSABLE) ×3
GOWN STRL REUS W/TWL XL LVL3 (GOWN DISPOSABLE) ×12
KIT BASIN OR (CUSTOM PROCEDURE TRAY) ×3 IMPLANT
KIT ROOM TURNOVER OR (KITS) ×3 IMPLANT
NS IRRIG 1000ML POUR BTL (IV SOLUTION) ×3 IMPLANT
PACK ORTHO EXTREMITY (CUSTOM PROCEDURE TRAY) ×3 IMPLANT
PAD ABD 8X10 STRL (GAUZE/BANDAGES/DRESSINGS) ×2 IMPLANT
PAD ARMBOARD 7.5X6 YLW CONV (MISCELLANEOUS) ×6 IMPLANT
PAD CAST 4YDX4 CTTN HI CHSV (CAST SUPPLIES) ×1 IMPLANT
PADDING CAST COTTON 4X4 STRL (CAST SUPPLIES) ×3
SOL PREP POV-IOD 4OZ 10% (MISCELLANEOUS) ×2 IMPLANT
SPONGE GAUZE 4X4 12PLY STER LF (GAUZE/BANDAGES/DRESSINGS) ×2 IMPLANT
SPONGE LAP 18X18 X RAY DECT (DISPOSABLE) ×6 IMPLANT
STAPLER VISISTAT 35W (STAPLE) ×3 IMPLANT
STOCKINETTE IMPERVIOUS LG (DRAPES) IMPLANT
SUT ETHILON 2 0 PSLX (SUTURE) ×6 IMPLANT
SUT PROLENE 3 0 SH 48 (SUTURE) ×2 IMPLANT
TOWEL OR 17X24 6PK STRL BLUE (TOWEL DISPOSABLE) ×3 IMPLANT
TOWEL OR 17X26 10 PK STRL BLUE (TOWEL DISPOSABLE) ×3 IMPLANT
TUBE CONNECTING 12'X1/4 (SUCTIONS) ×1
TUBE CONNECTING 12X1/4 (SUCTIONS) ×2 IMPLANT
WATER STERILE IRR 1000ML POUR (IV SOLUTION) ×3 IMPLANT
YANKAUER SUCT BULB TIP NO VENT (SUCTIONS) ×3 IMPLANT

## 2015-06-28 NOTE — Progress Notes (Signed)
Pharmacy Antibiotic Note  John [name] is a 82 y.o. male admitted on 06/28/2015 with diabetic foot infxn, osteomyelitis.  Pharmacy has been consulted for vanc/cefepime dosing. Admitted s/p amputation of 2nd L toe. AF, labs pending.  Received cefazolin x 1 at 0746.  Plan: Vanc 2g IV x1 Cefepime 2g IV x1 Monitor clinical progress, c/s, renal function, abx plan/LOT VT@SS  as indicated F/u SCr for abx maintenance dosing   Height: 5' 9.5" (176.5 cm) Weight: 257 lb 15 oz (117 kg) IBW/kg (Calculated) : 71.85  Temp (24hrs), Avg:98.1 F (36.7 C), Min:97.5 F (36.4 C), Max:99.1 F (37.3 C)   Recent Labs Lab 06/23/15 1446  WBC 3.7*  CREATININE 1.06    Estimated Creatinine Clearance: 58.9 mL/min (by C-G formula based on Cr of 1.06).    No Known Allergies  Antimicrobials this admission: 6/19 vanc >>  6/19 cefepime >>  Dose adjustments this admission:   Microbiology results: 6/19 BCx:  6/19 L toe tissue:    Babs Bertin, PharmD, [facility] Clinical Pharmacist Pager [phone] 06/28/2015 10:36 AM

## 2015-06-28 NOTE — H&P (Signed)
History and Physical    John [name] IHK:[id number] DOB: [birth year] DOA: 06/28/2015  PCP: Ezequiel Kayser, MD Patient coming from: peri operative area s/p left toe amputation  Chief Complaint: cellulitis  HPI: John [name] is a very pleasant and drowsy 82 y.o. male with medical history significant for nonhealing ulcer of left lower extremity, diabetes, CAD, A. fib status post pacemaker, hypertension, osteomyelitis being admitted status post amputation second left toe.  Information is obtained from the chart and the podiatrist. Patient with a history left second toe osteomyelitis and ulceration. Chart review indicates patient had been on antibiotics for "quite some time and underwent local wound care and wound was nonhealing". He was evaluated 5 days ago recommendation dictation of the left toe. Patient admitted status post amputation of left toe. Dr. Ardelle Anton moved left toe Penrose drain applied. Note indicates no pus but serosanguineous drainage. No report or documentation of fever chills leukocytosis. No report of nausea vomiting diarrhea. No complaints of any chest pain palpitations headache dizziness syncope or near-syncope. No dysuria hematuria frequency or urgency.    ED Course: none  Review of Systems: As per HPI otherwise 10 point review of systems negative.   Ambulatory Status: Uses walker on occasion.  Past Medical History  Diagnosis Date  . Ventricular tachycardia (HCC)     Syncope-2010 treated with amiodarone  . Subdural hematoma (HCC) 11/05    Precluding Coumadin  . Atrial fibrillation (HCC)     Permanent  . HTN (hypertension)   . CAD (coronary artery disease)     s/p CABG; Failed LAD PCI 2010  . HLD (hyperlipidemia)   . BPH (benign prostatic hyperplasia)   . Decreased hearing   . Gilbert syndrome   . Carotid artery occlusion   . Dementia   . DM2 (diabetes mellitus, type 2) (HCC)   . Memory loss   . CHF (congestive heart failure) (HCC)   . Dysrhythmia     hx  atrial fib ,tachycardia  . AICD (automatic cardioverter/defibrillator) present   . Presence of permanent cardiac pacemaker   . Stroke [facility]) ?10  . Headache   . Arthritis     Past Surgical History  Procedure Laterality Date  . Coronary artery bypass graft  1998  . Hemorrhoid surgery    . Pacemaker insertion  ?10    [employer]  . Peripheral vascular catheterization N/A 05/17/2015    Procedure: Abdominal Aortogram w/Lower Extremity;  Surgeon: Chuck Hint, MD;  Location: [facility] INVASIVE CV LAB;  Service: Cardiovascular;  Laterality: N/A;  . Brain surgery  05    subdural hematoma    Social History   Social History  . Marital Status: Married    Spouse Name: N/A  . Number of Children: N/A  . Years of Education: N/A   Occupational History  . retired principal    Social History Main Topics  . Smoking status: Never Smoker   . Smokeless tobacco: Never Used  . Alcohol Use: No  . Drug Use: No  . Sexual Activity: Not on file   Other Topics Concern  . Not on file   Social History Narrative    No Known Allergies  Family History  Problem Relation Age of Onset  . Coronary artery disease Mother   . Cancer Father     Prior to Admission medications   Medication Sig Start Date End Date Taking? Authorizing Provider  amiodarone (PACERONE) 200 MG tablet Take 200 mg by mouth daily. Sunday,Monday,Wednesday,Thursday, & Friday only  Yes Historical Provider, MD  aspirin 81 MG tablet Take 81 mg by mouth daily.     Yes Historical Provider, MD  carvedilol (COREG) 6.25 MG tablet Take 1 tablet (6.25 mg total) by mouth 2 (two) times daily with a meal. 03/03/15  Yes Duke Salvia, MD  Cholecalciferol (VITAMIN D3) 1000 UNITS CAPS Take 1,000 Units by mouth daily.    Yes Historical Provider, MD  ergocalciferol (VITAMIN D2) 50000 UNITS capsule Take 50,000 Units by mouth every Sunday. Reported on 05/13/2015   Yes Historical Provider, MD  escitalopram (LEXAPRO) 10 MG tablet Take 10 mg by  mouth daily. 05/12/15  Yes Historical Provider, MD  metFORMIN (GLUCOPHAGE) 500 MG tablet Take 250 mg by mouth daily with breakfast.    Yes Historical Provider, MD  Multiple Vitamins-Minerals (EYE VITAMINS) CAPS Take 1 capsule by mouth 2 (two) times daily.    Yes Historical Provider, MD  NAMENDA XR 28 MG CP24 24 hr capsule Take 28 mg by mouth daily.  05/07/15  Yes Historical Provider, MD  pravastatin (PRAVACHOL) 40 MG tablet Take 40 mg by mouth daily. Reported on 05/13/2015   Yes Historical Provider, MD  rivastigmine (EXELON) 9.5 mg/24hr Place 1 patch onto the skin daily.  06/14/15  Yes Historical Provider, MD    Physical Exam: Filed Vitals:   06/28/15 6294 06/28/15 0842 06/28/15 0856 06/28/15 0915  BP:  112/41 134/68 135/85  Pulse:  70 70 70  Temp:  97.5 F (36.4 C)    Resp:  17 15 14   Height: 5' 9.5" (1.765 m)     Weight: 110.224 kg (243 lb)     SpO2:  100% 98%      General:  Appears Quite drowsy somewhat pale but comfortable Eyes:  PERRL, EOMI, normal lids, iris ENT:  grossly normal hearing, lips & tongue, mucous membranes of his mouth are pink but dry Neck:  no LAD, masses or thyromegaly Cardiovascular:  RRR, no m/r/g. Left lower leg with swelling erythema mild heat. Dressing to left foot dry and intact. Left toes slightly cool to touch right leg with no lower extremity edema Respiratory:  CTA bilaterally, no w/r/r. Normal respiratory effort. Abdomen:  soft, ntnd, NABS Skin:  no rash or induration seen on limited exam Musculoskeletal:  grossly normal tone BUE/BLE, good ROM, no bony abnormality Psychiatric:  grossly normal mood and affect, speech fluent and appropriate, AOx3 Neurologic:  CN 2-12 grossly intact, moves all extremities in coordinated fashion, sensation intact  Labs on Admission: I have personally reviewed following labs and imaging studies  CBC:  Recent Labs Lab 06/23/15 1446  WBC 3.7*  HGB 10.5*  HCT 34.0*  MCV 102.1*  PLT 180   Basic Metabolic  Panel:  Recent Labs Lab 06/23/15 1446  NA 141  K 3.9  CL 108  CO2 27  GLUCOSE 157*  BUN 13  CREATININE 1.06  CALCIUM 9.0   GFR: Estimated Creatinine Clearance: 57.1 mL/min (by C-G formula based on Cr of 1.06). Liver Function Tests: No results for input(s): AST, ALT, ALKPHOS, BILITOT, PROT, ALBUMIN in the last 168 hours. No results for input(s): LIPASE, AMYLASE in the last 168 hours. No results for input(s): AMMONIA in the last 168 hours. Coagulation Profile: No results for input(s): INR, PROTIME in the last 168 hours. Cardiac Enzymes: No results for input(s): CKTOTAL, CKMB, CKMBINDEX, TROPONINI in the last 168 hours. BNP (last 3 results) No results for input(s): PROBNP in the last 8760 hours. HbA1C: No results for input(s): HGBA1C in  the last 72 hours. CBG:  Recent Labs Lab 06/23/15 1406 06/28/15 0630 06/28/15 0845  GLUCAP 167* 128* 108*   Lipid Profile: No results for input(s): CHOL, HDL, LDLCALC, TRIG, CHOLHDL, LDLDIRECT in the last 72 hours. Thyroid Function Tests: No results for input(s): TSH, T4TOTAL, FREET4, T3FREE, THYROIDAB in the last 72 hours. Anemia Panel: No results for input(s): VITAMINB12, FOLATE, FERRITIN, TIBC, IRON, RETICCTPCT in the last 72 hours. Urine analysis:    Component Value Date/Time   COLORURINE YELLOW 02/23/2009 2225   APPEARANCEUR CLOUDY* 02/23/2009 2225   LABSPEC 1.013 02/23/2009 2225   PHURINE 6.0 02/23/2009 2225   GLUCOSEU NEGATIVE 02/23/2009 2225   HGBUR LARGE* 02/23/2009 2225   BILIRUBINUR NEGATIVE 02/23/2009 2225   KETONESUR NEGATIVE 02/23/2009 2225   PROTEINUR NEGATIVE 02/23/2009 2225   UROBILINOGEN 1.0 02/23/2009 2225   NITRITE POSITIVE* 02/23/2009 2225   LEUKOCYTESUR LARGE* 02/23/2009 2225    Creatinine Clearance: Estimated Creatinine Clearance: 57.1 mL/min (by C-G formula based on Cr of 1.06).  Sepsis Labs: @LABRCNTIP (procalcitonin:4,lacticidven:4) )No results found for this or any previous visit (from the past  240 hour(s)).   Radiological Exams on Admission: No results found.  EKG: Independently reviewed. pending  Assessment/Plan Principal Problem:   Cellulitis Active Problems:   Diabetes (HCC)   Essential hypertension   Atrial fibrillation (HCC)   CAD (coronary artery disease)   Pacemaker-CRT     Vascular dementia, uncomplicated   CHF (congestive heart failure) (HCC)     1. Cellulitis left lower leg/osteomylitis/non-healing diabetic foot ulcer dose post amputation of left second toe the setting of peripheral vascular disease. Patient admitted from outpatient surgery secondary to purulent drainage, comorbidities per Dr. [employer] request. Lab work is pending. No current imaging. Patient is hemodynamically stable and not hypoxic. Nontoxic appearing -Admit to medical floor -Obtain CBC and basic metabolic panel -Follow cultures set by podiatry -Antibiotics per protocol for potential MRSA and Pseudomonas -Physical therapy -Social worker for likely placement -wound care per podiatry -Pain management  #2. A. fib. Not on anticoagulation secondary to history of subdural hematoma Kenna Gilbert vasc score 4. Controlled. Home medications include Coreg -We'll continue this with parameters  #3. CAD/CHF/peripheral vascular disease/ status post pacemaker. Heart score 5. No reports of chest pain. EKG pending. Medications include beta blocker, amiodarone. Echo 2011 reveals mild LVH, EF 40-45%, moderate hypokinesis. Chart review indicates device interrogated March 2017. -Continue home meds -Monitor intake and output -Obtain daily weights -EKG  4. Hypertension. Controlled in the postanesthesia care unit. Home medications include beta blocker. -Monitor closely -Continue home meds with parameters  5. Diabetes. I medications include metformin. -We will obtain hemoglobin A1c -We will hold metformin for now as it'll intake unreliable -Sliding scale insulin for optimal control  #6. Vascular  dementia. Appears stable at baseline. -Continue home meds  DVT prophylaxis: scd Code Status: full  Family Communication: none present  Disposition Plan: may need placement  Consults called: none  Admission status: obs    April 2017 M MD [facility]  If 7PM-7AM, please contact night-coverage [email or web address] Password [email or web address]  06/28/2015, 9:30 AM

## 2015-06-28 NOTE — Progress Notes (Signed)
Arrived to 6n19 from PACU, sleepy but easily arousable, oriented to room and surroundings, denies nausea/apin at this time

## 2015-06-28 NOTE — Progress Notes (Addendum)
Status post left partial 2nd ray amputation; no pus but serosanguinous drainage. Penrose drain placed.  Sent pathology and microbiology for clean margins WBAT in CAM boot D/C NPO Would keep on IV antibiotic while inpatient, broad spectrum since there are no cultures to go off right now.  Will likely need skilled nursing placement upon discharge.  Spoke to Toya Smothers, NP about admission.  I will continue to follow him while inpatient.   If you have any questions please call me directly at [phone] or you can reach the office at [phone] [facility])  Ovid Curd, DPM

## 2015-06-28 NOTE — Op Note (Signed)
Surgeon: Ovid Curd, DPM Assistants: none  Pre-operative diagnosis: Left 2nd digit osteomyelitis  Post-operative diagnosis: Same Procedure: Left 2nd digit amputation  Pathology: Specimen- left 2nd toe for pathology; left 2nd clean margin microbiology/pathology Anesthesia: Spinal  Hemostasis: Anatomic  EBL: minimal  Materials: Penrose drain; 3-0 monocryl Injectables: none Complications: none   Indications for Surgery: John [name] presented to John office today for nonhealing wound to left second toe as well as for exposed bone. He was sent by John wound care center after tenting conservative treatment and try to get John wound to heal without any success. I obtained medical clearance as well as discussed with vascular surgery in regards to healing potential. I had a long discussion with John [name] as well as multiple family members about John surgery including alternatives, risks, complications. John [name]'s wife who has power of attorney agrees to John procedure as well as all John family members. No promises or guarantees were given John outcome of John procedure and all questions were answered to John best of my ability.  Procedure in detail: John [name] was both verbally and visually identified by myself, John nursing staff, and anesthesia staff in John preoperative holding area he was then transferred to John operative via stretcher placed on John operative table in supine position. He underwent a spinal anesthetic by anesthesia. Next left lower extremity was then scrubbed prepped and draped in normal sterile fashion.  Attention was directed to John left second digit which a racquet-shaped incision was planned along John second toe. John incision was made from skin to bone with a #10 blade scalpel. John second toe was then disarticulated leveled MPJ. Serosanguineous drainage was expressed however there is no purulence. John second metatarsal head did appear to be somewhat discolored and therefore lifted  proceed with removal of John second metatarsal head. This was completed with a sagittal bone saw. John remaining bone appeared to be hard in nature and white in color. Small pieces of John bone from John remaining metatarsal were sent for both microbiology and pathology for clean margins. All nonviable tissue was debrided. There did appear to be bleeding and John procedure however there was decreased. At this time John incision was copiously irrigated with sterile saline and hemostasis was achieved. John incision was then closed with 3-0 Prolene and a Penrose drain was applied. Betadine was painted over John incision followed by a dry sterile dressing as well as a nonadherent dressing. He was then awoken from anesthesia and found to tolerated John procedure well any, complications. At John conclusion of procedure John surgical count was confirmed to be correct. He was then transferred to PACU with vital signs stable and vascular status intact.  Admit to inpatient and possible skilled nursing placement.

## 2015-06-28 NOTE — Transfer of Care (Signed)
Immediate Anesthesia Transfer of Care Note  Patient: John [name]  Procedure(s) Performed: Procedure(s): AMPUTATION SECOND LEFT TOE (Left)  Patient Location: PACU  Anesthesia Type:MAC and Spinal  Level of Consciousness: awake, alert , oriented and patient cooperative  Airway & Oxygen Therapy: Patient Spontanous Breathing  Post-op Assessment: Report given to RN and Post -op Vital signs reviewed and stable  Post vital signs: Reviewed  Last Vitals:  Filed Vitals:   06/28/15 0626  BP: 132/61  Pulse: 70  Temp: 37.3 C  Resp: 18    Last Pain: There were no vitals filed for this visit.    Patients Stated Pain Goal: 5 (06/28/15 0707)  Complications: No apparent anesthesia complications

## 2015-06-28 NOTE — Anesthesia Postprocedure Evaluation (Signed)
Anesthesia Post Note  Patient: John [name]  Procedure(s) Performed: Procedure(s) (LRB): AMPUTATION SECOND LEFT TOE (Left)  Patient location during evaluation: PACU Anesthesia Type: MAC and Spinal Level of consciousness: awake and oriented Pain management: pain level controlled Vital Signs Assessment: post-procedure vital signs reviewed and stable Respiratory status: spontaneous breathing, nonlabored ventilation and respiratory function stable Cardiovascular status: blood pressure returned to baseline Anesthetic complications: no    Last Vitals:  Filed Vitals:   06/28/15 1030 06/28/15 1408  BP: 150/67 116/56  Pulse: 70 70  Temp: 36.5 C 36.9 C  Resp: 16 16    Last Pain: There were no vitals filed for this visit.               Hamna Asa COKER

## 2015-06-28 NOTE — Progress Notes (Signed)
Pharmacy Antibiotic Note  John [name] is a 82 y.o. male admitted on 06/28/2015 with diabetic foot infxn, osteomyelitis.  Pharmacy has been consulted for vanc/cefepime dosing. Admitted s/p amputation of 2nd L toe. AF, SCr 1.1 on admit (1.06 on 6/14), normalized CrCl~45  Received cefazolin x 1 at 0746.  Plan: Vanc 2g IV x1; then 1250mg  IV q24h Cefepime 2g IV x1; 1g IV q8h Monitor clinical progress, c/s, renal function, abx plan/LOT VT@SS  as indicated   Height: 5' 9.5" (176.5 cm) Weight: 257 lb 15 oz (117 kg) IBW/kg (Calculated) : 71.85  Temp (24hrs), Avg:98.2 F (36.8 C), Min:97.5 F (36.4 C), Max:99.1 F (37.3 C)   Recent Labs Lab 06/23/15 1446 06/28/15 1037  WBC 3.7* 4.5  CREATININE 1.06 1.10    Estimated Creatinine Clearance: 56.8 mL/min (by C-G formula based on Cr of 1.1).    No Known Allergies  Antimicrobials this admission: 6/19 vanc >>  6/19 cefepime >>  Dose adjustments this admission:   Microbiology results: 6/19 BCx:  6/19 L toe tissue:    7/19, PharmD, [facility] Clinical Pharmacist Pager [phone] 06/28/2015 2:33 PM

## 2015-06-28 NOTE — Anesthesia Preprocedure Evaluation (Addendum)
Anesthesia Evaluation  Patient identified by MRN, date of birth, ID band Patient awake    Reviewed: Allergy & Precautions, NPO status , Patient's Chart, lab work & pertinent test results  Airway Mallampati: II  TM Distance: >3 FB Neck ROM: Limited    Dental  (+) Dental Advisory Given, Teeth Intact   Pulmonary    breath sounds clear to auscultation       Cardiovascular hypertension, Pt. on medications and Pt. on home beta blockers + CAD, + CABG and + Peripheral Vascular Disease  + dysrhythmias Atrial Fibrillation + pacemaker + Cardiac Defibrillator  Rhythm:Regular Rate:Normal  paced   Neuro/Psych CVA    GI/Hepatic   Endo/Other  diabetes, Type 2, Oral Hypoglycemic Agents  Renal/GU      Musculoskeletal   Abdominal   Peds  Hematology   Anesthesia Other Findings   Reproductive/Obstetrics                            Anesthesia Physical Anesthesia Plan  ASA: III  Anesthesia Plan: MAC and Regional   Post-op Pain Management:    Induction: Intravenous  Airway Management Planned: Natural Airway and Simple Face Mask  Additional Equipment:   Intra-op Plan:   Post-operative Plan:   Informed Consent:   Plan Discussed with: CRNA and Anesthesiologist  Anesthesia Plan Comments:         Anesthesia Quick Evaluation

## 2015-06-28 NOTE — Anesthesia Procedure Notes (Addendum)
Procedure Name: MAC Date/Time: 06/28/2015 7:40 AM Performed by: Lovie Chol Pre-anesthesia Checklist: Patient identified, Emergency Drugs available, Suction available, Patient being monitored and Timeout performed Patient Re-evaluated:Patient Re-evaluated prior to inductionOxygen Delivery Method: Simple face mask   Spinal Patient location during procedure: ICU Start time: 06/28/2015 7:40 AM End time: 06/28/2015 7:45 AM Staffing Performed by: anesthesiologist  Preanesthetic Checklist Completed: patient identified, site marked, surgical consent, pre-op evaluation, timeout performed, IV checked, risks and benefits discussed and monitors and equipment checked Spinal Block Patient position: right lateral decubitus Prep: Betadine Patient monitoring: cardiac monitor Approach: right paramedian Location: L4-5 Injection technique: single-shot Needle Needle type: Tuohy  Needle gauge: 22 G Needle length: 9 cm Assessment Sensory level: T6 Additional Notes 10 mg 0.75% Bupivacaine injected easily

## 2015-06-28 NOTE — Brief Op Note (Signed)
06/28/2015  8:33 AM  PATIENT:  John [name]  82 y.o. male  PRE-OPERATIVE DIAGNOSIS:  osteomylitis  POST-OPERATIVE DIAGNOSIS:  osteomylitis  PROCEDURE:  Procedure(s): AMPUTATION SECOND LEFT TOE (Left)  SURGEON:  Surgeon(s) and Role:    * Vivi Barrack, DPM - Primary  PHYSICIAN ASSISTANT:   ASSISTANTS: none   ANESTHESIA:   spinal  EBL:  Total I/O In: 800 [I.V.:800] Out: -   BLOOD ADMINISTERED:none  DRAINS: Penrose drain in the left 2nd toe amputation site   LOCAL MEDICATIONS USED:  NONE  SPECIMEN:  No Specimen  DISPOSITION OF SPECIMEN:  left 2nd toe for pathology and clean margin 2nd metatarsal for micro and pathology  COUNTS:  YES  TOURNIQUET:  * No tourniquets in log *  DICTATION: .Dragon Dictation  PLAN OF CARE: Admit to inpatient   PATIENT DISPOSITION:  PACU - hemodynamically stable.   Delay start of Pharmacological VTE agent (>24hrs) due to surgical blood loss or risk of bleeding: no

## 2015-06-29 ENCOUNTER — Telehealth: Payer: Self-pay | Admitting: *Deleted

## 2015-06-29 ENCOUNTER — Encounter (HOSPITAL_COMMUNITY): Payer: Self-pay | Admitting: Podiatry

## 2015-06-29 ENCOUNTER — Observation Stay (HOSPITAL_COMMUNITY): Payer: Medicare Other

## 2015-06-29 DIAGNOSIS — F0150 Vascular dementia without behavioral disturbance: Secondary | ICD-10-CM | POA: Diagnosis not present

## 2015-06-29 DIAGNOSIS — E118 Type 2 diabetes mellitus with unspecified complications: Secondary | ICD-10-CM | POA: Diagnosis not present

## 2015-06-29 DIAGNOSIS — M868X4 Other osteomyelitis, hand: Secondary | ICD-10-CM | POA: Diagnosis not present

## 2015-06-29 DIAGNOSIS — L03116 Cellulitis of left lower limb: Secondary | ICD-10-CM | POA: Diagnosis not present

## 2015-06-29 DIAGNOSIS — I10 Essential (primary) hypertension: Secondary | ICD-10-CM | POA: Diagnosis not present

## 2015-06-29 LAB — BASIC METABOLIC PANEL
ANION GAP: 4 — AB (ref 5–15)
BUN: 16 mg/dL (ref 6–20)
CALCIUM: 8.7 mg/dL — AB (ref 8.9–10.3)
CO2: 28 mmol/L (ref 22–32)
CREATININE: 1.07 mg/dL (ref 0.61–1.24)
Chloride: 108 mmol/L (ref 101–111)
GFR, EST NON AFRICAN AMERICAN: 59 mL/min — AB (ref >60)
GLUCOSE: 127 mg/dL — AB (ref 65–99)
Potassium: 4.2 mmol/L (ref 3.5–5.1)
Sodium: 140 mmol/L (ref 135–145)

## 2015-06-29 LAB — CBC
HCT: 31.9 % — ABNORMAL LOW (ref 39.0–52.0)
HEMOGLOBIN: 9.8 g/dL — AB (ref 13.0–17.0)
MCH: 31.9 pg (ref 26.0–34.0)
MCHC: 30.7 g/dL (ref 30.0–36.0)
MCV: 103.9 fL — ABNORMAL HIGH (ref 78.0–100.0)
PLATELETS: 149 10*3/uL — AB (ref 150–400)
RBC: 3.07 MIL/uL — ABNORMAL LOW (ref 4.22–5.81)
RDW: 15 % (ref 11.5–15.5)
WBC: 4.5 10*3/uL (ref 4.0–10.5)

## 2015-06-29 LAB — HEMOGLOBIN A1C
Hgb A1c MFr Bld: 5.8 % — ABNORMAL HIGH (ref 4.8–5.6)
Mean Plasma Glucose: 120 mg/dL

## 2015-06-29 LAB — GLUCOSE, CAPILLARY
GLUCOSE-CAPILLARY: 126 mg/dL — AB (ref 65–99)
GLUCOSE-CAPILLARY: 128 mg/dL — AB (ref 65–99)
Glucose-Capillary: 153 mg/dL — ABNORMAL HIGH (ref 65–99)
Glucose-Capillary: 123 mg/dL — ABNORMAL HIGH (ref 65–99)

## 2015-06-29 NOTE — Telephone Encounter (Addendum)
Post op courtesy call-Left message requesting a callback with pt's status. Left message instructing pt not to be weight bearing or dangling the foot more than 15 mins/hour, keep the dressing clean and dry and in place it would be changed at the 1st POV, remain in the boot to walk , but may take off to ice, rest, and take pain medication as directed and call with concerns. 08/09/2015-Cindy - states pt's post op amputation wound is healed and the dressing will not stay on his foot, they have him in a clean sock and orthopedic shoe is that okay, please advise. 08/10/2015-DrArdelle Anton states pt may wear a clean white cotton sock as dressing since the wound is healed and the dressing will not stay on. Orders called to Nursing Supervisor - Ann at [facility].

## 2015-06-29 NOTE — Progress Notes (Signed)
Came back to update [facility] on plan now that PT had seen patient.  SNF placement in progress.  Mrs. Sereno not prsent but son was and plan written on my face card to pass to patient's wife.  IV abx, PT, and wound care. Marlin Canary DO

## 2015-06-29 NOTE — Care Management Obs Status (Signed)
MEDICARE OBSERVATION STATUS NOTIFICATION   Patient Details  Name: John [name] MRN: [id number] Date of Birth: Dec 23, 1922   Medicare Observation Status Notification Given:  Yes    Kingsley Plan, RN 06/29/2015, 10:26 AM

## 2015-06-29 NOTE — Evaluation (Signed)
Physical Therapy Evaluation Patient Details Name: John [name] MRN: [id number] DOB: May 05, 1922 Today's Date: 06/29/2015   History of Present Illness  John [name] is a 82 y.o. male with a Past Medical History of SVT, SDH, Afib, CAD, HTN, BPH, Gilbert syndrome, dementia, DM, CHF, AICD placement, CVA, HA, who presented to [facility] for elective toe removal by podiatry.  Clinical Impression  Patient presents with decreased independence with mobility due to deficits listed in PT problem list.  He will benefit from skilled PT in the acute setting to allow return home following SNF level rehab stay.  Was at ILF prior to surgery, but do not feel wife can assist safely in that setting.      Follow Up Recommendations SNF;Supervision/Assistance - 24 hour    Equipment Recommendations  None recommended by PT    Recommendations for Other Services       Precautions / Restrictions Precautions Precautions: Fall Required Braces or Orthoses: Other Brace/Splint Other Brace/Splint: camboot (MD to order) Restrictions Weight Bearing Restrictions: Yes LLE Weight Bearing: Weight bearing as tolerated      Mobility  Bed Mobility Overal bed mobility: Needs Assistance Bed Mobility: Supine to Sit     Supine to sit: Mod assist     General bed mobility comments: assist to lift shoulders and scoot to EOB  Transfers Overall transfer level: Needs assistance Equipment used: Rolling walker (2 wheeled) Transfers: Sit to/from [employer] Sit to Stand: Mod assist Stand pivot transfers: Mod assist       General transfer comment: up from EOB and stand pivot to recliner with post op shoe in the room  Ambulation/Gait                Stairs            Wheelchair Mobility    Modified Rankin (Stroke Patients Only)       Balance Overall balance assessment: Needs assistance Sitting-balance support: Feet supported Sitting balance-Leahy Scale: Fair     Standing balance  support: Bilateral upper extremity supported Standing balance-Leahy Scale: Poor Standing balance comment: imbalance with standing wtih walker and stepping to chair mod support for balance                             Pertinent Vitals/Pain Pain Assessment: Faces Faces Pain Scale: Hurts little more Pain Location: L foot with weight bearing    Home Living Family/patient expects to be discharged to:: Skilled nursing facility Living Arrangements: Spouse/significant other               Additional Comments: was at [facility] ILF    Prior Function Level of Independence: Needs assistance   Gait / Transfers Assistance Needed: walked with some stand by assist wtih rollator           Hand Dominance   Dominant Hand: Right    Extremity/Trunk Assessment   Upper Extremity Assessment: Overall WFL for tasks assessed           Lower Extremity Assessment: RLE deficits/detail;LLE deficits/detail RLE Deficits / Details: AROM WFL, strength grossly 4/5  LLE Deficits / Details: AROM WFL, except foot due to wrapped in ace bandage and gauze dressing, asensate toes, strength hip flexion 4/5; errythma and warmth up 2/3 of lower leg     Communication   Communication: HOH  Cognition Arousal/Alertness: Awake/alert Behavior During Therapy: WFL for tasks assessed/performed Overall Cognitive Status: History of cognitive impairments - at  baseline                      General Comments General comments (skin integrity, edema, etc.): wife in the room, was unable to recall name of ILF where they live until I named a few.  States her children won't let her drive anymore, did provide some info on pt's PLOF; agrees to SNF level rehab    Exercises        Assessment/Plan    PT Assessment Patient needs continued PT services  PT Diagnosis Difficulty walking;Acute pain;Generalized weakness   PT Problem List Decreased mobility;Decreased balance;Decreased activity  tolerance;Pain;Impaired sensation;Decreased safety awareness;Decreased knowledge of precautions;Decreased strength  PT Treatment Interventions DME instruction;Gait training;Functional mobility training;Patient/family education;Therapeutic activities;Therapeutic exercise;Balance training   PT Goals (Current goals can be found in the Care Plan section) Acute Rehab PT Goals Patient Stated Goal: To go to rehab PT Goal Formulation: With patient/family Time For Goal Achievement: 07/06/15 Potential to Achieve Goals: Fair    Frequency Min 3X/week   Barriers to discharge        Co-evaluation               End of Session Equipment Utilized During Treatment: Gait belt Activity Tolerance: Patient limited by pain Patient left: in chair;with call bell/phone within reach;with chair alarm set;with family/visitor present      Functional Assessment Tool Used: Clinial Judgement Functional Limitation: Mobility: Walking and moving around Mobility: Walking and Moving Around Current Status [phone]): At least 60 percent but less than 80 percent impaired, limited or restricted Mobility: Walking and Moving Around Goal Status [phone]): At least 40 percent but less than 60 percent impaired, limited or restricted    Time: 1229-1256 PT Time Calculation (min) (ACUTE ONLY): 27 min   Charges:   PT Evaluation $PT Eval Moderate Complexity: 1 Procedure PT Treatments $Therapeutic Activity: 8-22 mins   PT G Codes:   PT G-Codes **NOT FOR INPATIENT CLASS** Functional Assessment Tool Used: Clinial Judgement Functional Limitation: Mobility: Walking and moving around Mobility: Walking and Moving Around Current Status ([id number]): At least 60 percent but less than 80 percent impaired, limited or restricted Mobility: Walking and Moving Around Goal Status [phone]): At least 40 percent but less than 60 percent impaired, limited or restricted    Elray Mcgregor 06/29/2015, 2:19 PM  Sheran Lawless,  [place] [phone] 06/29/2015

## 2015-06-29 NOTE — Clinical Social Work Note (Signed)
CSW contacted [facility] and patient is from the indepdent living section of [facility], not ALF or Memory Care.  CSW will continue to follow patient's progress if SNF is recommended.  Ervin Knack. Irene Mitcham, MSW, LCSWA [phone] 06/29/2015 11:41 AM

## 2015-06-29 NOTE — Progress Notes (Signed)
Orthopedic Tech Progress Note Patient Details:  John [name] July 30, 1922 [id number]  Ortho Devices Type of Ortho Device: CAM walker Ortho Device/Splint Location: LLE Ortho Device/Splint Interventions: Ordered, Application   Jennye Moccasin 06/29/2015, 8:33 PM

## 2015-06-29 NOTE — Progress Notes (Signed)
S: [name] is POD #1 s/p left 2nd partial ray amputation. He states he is doing well. He denies any systemic complaints as fevers, chills, nausea, vomiting. He did get seen by physical therapy today and he was up and walking in the surgical shoe. He has continue antibiotics while inpatient. No new concerns today.  O: Blood pressure 117/56, pulse 69, temperature 98.9 F (37.2 C), temperature source Oral, resp. rate 18, height 5' 9.5" (1.765 m), weight 257 lb 15 oz (117 kg), SpO2 95 %. AAO x3, NAD; laying in bed eating dinner with son present On the left foot dressing is clean, dry, intact. There is an immediate capillary refill time to all the meaning digits. There does appear to be chronic erythema to the left leg which appears to be unchanged compared to prior. There is no pain with calf compression.  CBC    Component Value Date/Time   WBC 4.5 06/29/2015 0534   RBC 3.07* 06/29/2015 0534   HGB 9.8* 06/29/2015 0534   HCT 31.9* 06/29/2015 0534   PLT 149* 06/29/2015 0534   MCV 103.9* 06/29/2015 0534   MCH 31.9 06/29/2015 0534   MCHC 30.7 06/29/2015 0534   RDW 15.0 06/29/2015 0534   LYMPHSABS 1.3 06/28/2015 1037   MONOABS 0.5 06/28/2015 1037   EOSABS 0.1 06/28/2015 1037   BASOSABS 0.0 06/28/2015 1037    BMP Latest Ref Rng 06/29/2015 06/28/2015 06/23/2015  Glucose 65 - 99 mg/dL 174(B) 449(Q) 759(F)  BUN 6 - 20 mg/dL 16 15 13   Creatinine 0.61 - 1.24 mg/dL 6.38 4.66  Sodium 135 - 145 mmol/L 140 141 141  Potassium 3.5 - 5.1 mmol/L 4.2 3.8 3.9  Chloride 101 - 111 mmol/L 108 109 108  CO2 22 - 32 mmol/L 28 26 27   Calcium 8.9 - 10.3 mg/dL 5.99) ) 9.0   Assessment: POD #1 s/p left partial 2nd ray amputation  Plan: -Discussed the surgery with the patient and son who was at bedside today. -Awaiting OR cultures. Continue IV antibiotic for now. Likely discharge home with 2 weeks of Augmentin however will await cultures. -Chronic erythema to the left leg likely result of vascular  changes however cannot rule out infection. Again continue IV antibiotics. -Weightbearing as tolerated. We'll order cam boot. -I will see the patient in morning to remove the drain.  -Continue to follow the patient while inpatient. I will schedule an appointment next week for follow-up with me pending discharge -If any questions please feel free to call me directly at [phone] or you can reach me at the office at [phone] [facility]).   [phone], DPM

## 2015-06-29 NOTE — Progress Notes (Addendum)
PROGRESS NOTE    John [name]  GLO:[id number] DOB: December 17, 1922 DOA: 06/28/2015 PCP: Ezequiel Kayser, MD   Outpatient Specialists:  Dr. Ardelle Anton (Podiatry)   Brief Narrative:  John [name] is a very pleasant and drowsy 82 y.o. male with medical history significant for nonhealing ulcer of left lower extremity, diabetes, CAD, A. fib status post pacemaker, hypertension, osteomyelitis being admitted status post amputation second left toe.  Information is obtained from the chart and the podiatrist. Patient with a history left second toe osteomyelitis and ulceration. Chart review indicates patient had been on antibiotics for "quite some time and underwent local wound care and wound was nonhealing". He was evaluated 5 days ago recommendation dictation of the left toe. Patient admitted status post amputation of left toe. Dr. Ardelle Anton moved left toe Penrose drain applied. Note indicates no pus but serosanguineous drainage. No report or documentation of fever chills leukocytosis. No report of nausea vomiting diarrhea. No complaints of any chest pain palpitations headache dizziness syncope or near-syncope. No dysuria hematuria frequency or urgency.    Assessment & Plan:   Principal Problem:   Cellulitis Active Problems:   Diabetes (HCC)   Essential hypertension   Atrial fibrillation (HCC)   CAD (coronary artery disease)   Pacemaker-CRT   [employer]   Vascular dementia, uncomplicated   CHF (congestive heart failure) (HCC)   Diabetes mellitus with complication (HCC)   Cellulitis left lower leg/osteomylitis/non-healing diabetic foot ulcer dose post amputation of left second toe the setting of peripheral vascular disease.  -Follow cultures sent by podiatry -Antibiotics per protocol for potential MRSA and Pseudomonas -Physical therapy -Social worker for likely placement -wound care per podiatry -Pain management  A. fib. Not on anticoagulation secondary to history of subdural hematoma [place]  vasc score 4. Controlled. -Coreg  CAD/CHF/peripheral vascular disease/ status post pacemaker. Heart score 5. No reports of chest pain. EKG pending. Medications include beta blocker, amiodarone. Echo 2011 reveals mild LVH, EF 40-45%, moderate hypokinesis. Chart review indicates device interrogated March 2017. -Continue home meds -Monitor intake and output -Obtain daily weights -EKG  Hypertension -Monitor closely -Continue home meds with parameters  Diabetes - hemoglobin A1c: 5.8 -hold metformin -Sliding scale insulin  Vascular dementia. Appears stable at baseline. -Continue home meds   DVT prophylaxis:  SCD's  Code Status: Full Code   Family Communication: Wife at bedside  Disposition Plan:  PT eval ordered   Consultants:     Procedures:        Subjective: No complaints Pleasantly confused  Objective: Filed Vitals:   06/28/15 1030 06/28/15 1408 06/28/15 1824 06/28/15 2135  BP: 150/67 116/56 142/72 121/52  Pulse: 70 70 70 69  Temp: 97.7 F (36.5 C) 98.4 F (36.9 C) 98 F (36.7 C) 99.2 F (37.3 C)  TempSrc: Oral Oral Oral Oral  Resp: 16 16 16 17   Height:      Weight: 117 kg (257 lb 15 oz)     SpO2: 100% 97% 98% 92%    Intake/Output Summary (Last 24 hours) at 06/29/15 0919 Last data filed at 06/29/15 0849  Gross per 24 hour  Intake   1350 ml  Output    300 ml  Net   1050 ml   Filed Weights   06/28/15 0626 06/28/15 0707 06/28/15 1030  Weight: 110.224 kg (243 lb) 110.224 kg (243 lb) 117 kg (257 lb 15 oz)    Examination:  General exam: Appears calm and comfortable-- pleasantly confused Respiratory system: Clear to auscultation. Respiratory effort normal.  Cardiovascular system: irr. Gastrointestinal system: Abdomen is nondistended, soft and nontender. No organomegaly or masses felt. Normal bowel sounds heard. Skin: left leg wrapped    Data Reviewed: I have personally reviewed following labs and imaging studies  CBC:  Recent  Labs Lab 06/23/15 1446 06/28/15 1037 06/29/15 0534  WBC 3.7* 4.5 4.5  NEUTROABS  --  2.6  --   HGB 10.5* 10.1* 9.8*  HCT 34.0* 32.6* 31.9*  MCV 102.1* 101.9* 103.9*  PLT 180 140* 149*   Basic Metabolic Panel:  Recent Labs Lab 06/23/15 1446 06/28/15 1037 06/29/15 0534  NA 141 141 140  K 3.9 3.8 4.2  CL 108 109 108  CO2 27 26 28   GLUCOSE 157* 114* 127*  BUN 13 15 16   CREATININE 1.06 1.10 1.07  CALCIUM 9.0 8.7* 8.7*   GFR: Estimated Creatinine Clearance: 58.3 mL/min (by C-G formula based on Cr of 1.07). Liver Function Tests: No results for input(s): AST, ALT, ALKPHOS, BILITOT, PROT, ALBUMIN in the last 168 hours. No results for input(s): LIPASE, AMYLASE in the last 168 hours. No results for input(s): AMMONIA in the last 168 hours. Coagulation Profile: No results for input(s): INR, PROTIME in the last 168 hours. Cardiac Enzymes: No results for input(s): CKTOTAL, CKMB, CKMBINDEX, TROPONINI in the last 168 hours. BNP (last 3 results) No results for input(s): PROBNP in the last 8760 hours. HbA1C:  Recent Labs  06/28/15 1037  HGBA1C 5.8*   CBG:  Recent Labs Lab 06/28/15 0845 06/28/15 1151 06/28/15 1646 06/28/15 2135 06/29/15 0831  GLUCAP 108* 121* 96 178* 126*   Lipid Profile: No results for input(s): CHOL, HDL, LDLCALC, TRIG, CHOLHDL, LDLDIRECT in the last 72 hours. Thyroid Function Tests: No results for input(s): TSH, T4TOTAL, FREET4, T3FREE, THYROIDAB in the last 72 hours. Anemia Panel: No results for input(s): VITAMINB12, FOLATE, FERRITIN, TIBC, IRON, RETICCTPCT in the last 72 hours. Urine analysis:    Component Value Date/Time   COLORURINE YELLOW 02/23/2009 2225   APPEARANCEUR CLOUDY* 02/23/2009 2225   LABSPEC 1.013 02/23/2009 2225   PHURINE 6.0 02/23/2009 2225   GLUCOSEU NEGATIVE 02/23/2009 2225   HGBUR LARGE* 02/23/2009 2225   BILIRUBINUR NEGATIVE 02/23/2009 2225   KETONESUR NEGATIVE 02/23/2009 2225   PROTEINUR NEGATIVE 02/23/2009 2225    UROBILINOGEN 1.0 02/23/2009 2225   NITRITE POSITIVE* 02/23/2009 2225   LEUKOCYTESUR LARGE* 02/23/2009 2225      Recent Results (from the past 240 hour(s))  Aerobic/Anaerobic Culture (surgical/deep wound)     Status: None (Preliminary result)   Collection Time: 06/28/15  8:46 AM  Result Value Ref Range Status   Specimen Description TISSUE LEFT TOE  Final   Special Requests SMALL PIECE LEFT SECOND TOE CLEAN MARGIN  Final   Gram Stain   Final    RARE WBC PRESENT, PREDOMINANTLY PMN RARE GRAM VARIABLE ROD    Culture PENDING  Incomplete   Report Status PENDING  Incomplete  Aerobic/Anaerobic Culture (surgical/deep wound)     Status: None (Preliminary result)   Collection Time: 06/28/15 10:25 AM  Result Value Ref Range Status   Specimen Description TISSUE LEFT TOE  Final   Special Requests LEFT SECOND TOE CLEAN MARGIN LARGE SPECIMEN 2  Final   Gram Stain   Final    RARE WBC PRESENT, PREDOMINANTLY PMN NO ORGANISMS SEEN    Culture PENDING  Incomplete   Report Status PENDING  Incomplete      Anti-infectives    Start     Dose/Rate Route Frequency Ordered Stop  06/29/15 1300  vancomycin (VANCOCIN) 1,250 mg in sodium chloride 0.9 % 250 mL IVPB     1,250 mg 166.7 mL/hr over 90 Minutes Intravenous Every 24 hours 06/28/15 1439     06/28/15 2100  ceFEPIme (MAXIPIME) 1 g in dextrose 5 % 50 mL IVPB     1 g 100 mL/hr over 30 Minutes Intravenous Every 8 hours 06/28/15 1439     06/28/15 1045  vancomycin (VANCOCIN) 2,000 mg in sodium chloride 0.9 % 500 mL IVPB     2,000 mg 250 mL/hr over 120 Minutes Intravenous  Once 06/28/15 1038 06/28/15 1501   06/28/15 1045  ceFEPIme (MAXIPIME) 2 g in dextrose 5 % 50 mL IVPB     2 g 100 mL/hr over 30 Minutes Intravenous  Once 06/28/15 1038 06/28/15 1255   06/28/15 0900  cefTRIAXone (ROCEPHIN) 2 g in dextrose 5 % 50 mL IVPB  Status:  Discontinued     2 g 100 mL/hr over 30 Minutes Intravenous Every 24 hours 06/28/15 0849 06/28/15 0911   06/28/15 0900   metroNIDAZOLE (FLAGYL) IVPB 500 mg  Status:  Discontinued     500 mg 100 mL/hr over 60 Minutes Intravenous Every 8 hours 06/28/15 0849 06/28/15 0911   06/28/15 0700  ceFAZolin (ANCEF) 3 g in dextrose 5 % 50 mL IVPB     3 g 130 mL/hr over 30 Minutes Intravenous To ShortStay Surgical 06/27/15 1051 06/28/15 0746       Radiology Studies: No results found.      Scheduled Meds: . amiodarone  200 mg Oral Daily  . carvedilol  6.25 mg Oral BID WC  . ceFEPime (MAXIPIME) IV  1 g Intravenous Q8H  . cholecalciferol  1,000 Units Oral Daily  . escitalopram  10 mg Oral Daily  . insulin aspart  0-15 Units Subcutaneous TID WC  . insulin aspart  0-5 Units Subcutaneous QHS  . memantine  28 mg Oral Daily  . pravastatin  40 mg Oral Daily  . rivastigmine  9.5 mg Transdermal Daily  . vancomycin  1,250 mg Intravenous Q24H  . [START ON 07/04/2015] Vitamin D (Ergocalciferol)  50,000 Units Oral Q Sun   Continuous Infusions:       Time spent: 35 min    Kobe Jansma Juanetta Gosling, DO [facility] Pager [phone]  If 7PM-7AM, please contact night-coverage [email or web address] Password [facility] 06/29/2015, 9:19 AM

## 2015-06-30 DIAGNOSIS — E1159 Type 2 diabetes mellitus with other circulatory complications: Secondary | ICD-10-CM | POA: Diagnosis not present

## 2015-06-30 DIAGNOSIS — L03116 Cellulitis of left lower limb: Secondary | ICD-10-CM | POA: Diagnosis not present

## 2015-06-30 DIAGNOSIS — I2510 Atherosclerotic heart disease of native coronary artery without angina pectoris: Secondary | ICD-10-CM

## 2015-06-30 DIAGNOSIS — E118 Type 2 diabetes mellitus with unspecified complications: Secondary | ICD-10-CM

## 2015-06-30 DIAGNOSIS — I5022 Chronic systolic (congestive) heart failure: Secondary | ICD-10-CM

## 2015-06-30 DIAGNOSIS — I10 Essential (primary) hypertension: Secondary | ICD-10-CM

## 2015-06-30 DIAGNOSIS — I482 Chronic atrial fibrillation: Secondary | ICD-10-CM | POA: Diagnosis not present

## 2015-06-30 DIAGNOSIS — M868X4 Other osteomyelitis, hand: Secondary | ICD-10-CM | POA: Diagnosis not present

## 2015-06-30 DIAGNOSIS — F0150 Vascular dementia without behavioral disturbance: Secondary | ICD-10-CM

## 2015-06-30 LAB — GLUCOSE, CAPILLARY
GLUCOSE-CAPILLARY: 137 mg/dL — AB (ref 65–99)
GLUCOSE-CAPILLARY: 112 mg/dL — AB (ref 65–99)
Glucose-Capillary: 102 mg/dL — ABNORMAL HIGH (ref 65–99)
Glucose-Capillary: 132 mg/dL — ABNORMAL HIGH (ref 65–99)

## 2015-06-30 MED ORDER — HEPARIN SODIUM (PORCINE) 5000 UNIT/ML IJ SOLN
5000.0000 [IU] | Freq: Three times a day (TID) | INTRAMUSCULAR | Status: DC
  Administered 2015-06-30 – 2015-07-01 (×3): 5000 [IU] via SUBCUTANEOUS
  Filled 2015-06-30 (×3): qty 1

## 2015-06-30 NOTE — Clinical Social Work Note (Signed)
Patient has bed at [facility]. Family aware of bed and anticipated d/c date. Facility notified that patient will d/c on tomorrow, 6/22 with IV abx, vancomycin and wound care.   CSW remains available as needed.   Derenda Fennel, MSW, LCSWA [phone] 06/30/2015 2:53 PM

## 2015-06-30 NOTE — Progress Notes (Signed)
Physical Therapy Treatment Patient Details Name: John [name] MRN: [id number] DOB: 06/30/1922 Today's Date: 06/30/2015    History of Present Illness John [name] is a 82 y.o. male with a Past Medical History of SVT, SDH, Afib, CAD, HTN, BPH, Gilbert syndrome, dementia, DM, CHF, AICD placement, CVA, HA, who presented to [facility] for elective toe removal by podiatry.    PT Comments    Pt progressed to gait training in CAM boot, pt unsteady with gait training and requires close chair follow for safety. Current plan remains appropriate will continue to follow during hospitalization.    Follow Up Recommendations  SNF;Supervision/Assistance - 24 hour     Equipment Recommendations  None recommended by PT    Recommendations for Other Services       Precautions / Restrictions Precautions Precautions: Fall Required Braces or Orthoses: Other Brace/Splint Other Brace/Splint: camboot (MD to order) Restrictions Weight Bearing Restrictions: Yes LLE Weight Bearing: Weight bearing as tolerated    Mobility  Bed Mobility Overal bed mobility: Needs Assistance Bed Mobility: Supine to Sit     Supine to sit: Min assist     General bed mobility comments: Pt performed increased mobility with decreased assistance.  pt required tactile cues to guide LEs edge of bed.  pt required assistance for trunk elevation and increased time to complete transfer.    Transfers Overall transfer level: Needs assistance Equipment used: Rolling walker (2 wheeled) Transfers: Sit to/from Stand Sit to Stand: Mod assist         General transfer comment: Cues for hand placement to push from seated surface.  Bed height placed in slightly elevated position.    Ambulation/Gait Ambulation/Gait assistance: Min assist;Mod assist Ambulation Distance (Feet): 18 Feet Assistive device: Rolling walker (2 wheeled) Gait Pattern/deviations: Step-through pattern;Decreased stride length;Shuffle;Antalgic;Trunk flexed      General Gait Details: Cues for upper trunk control, B knee extension, and RW safety and position.  Pt buckled x2 and required increased assist to maintain standing.     Stairs            Wheelchair Mobility    Modified Rankin (Stroke Patients Only)       Balance     Sitting balance-Leahy Scale: Fair       Standing balance-Leahy Scale: Poor                      Cognition Arousal/Alertness: Awake/alert Behavior During Therapy: WFL for tasks assessed/performed Overall Cognitive Status: History of cognitive impairments - at baseline                      Exercises General Exercises - Lower Extremity Ankle Circles/Pumps: AROM;Both;10 reps;Supine Quad Sets: Both;10 reps;Supine Heel Slides: AROM;Both;10 reps;Supine Hip ABduction/ADduction: AROM;Both;10 reps;Supine Straight Leg Raises: AROM;Both;10 reps;Supine    General Comments        Pertinent Vitals/Pain Pain Assessment: 0-10 Faces Pain Scale: Hurts little more Pain Location: L foot with weight bearing.   Pain Descriptors / Indicators: Grimacing;Guarding;Discomfort Pain Intervention(s): Monitored during session;Repositioned    Home Living                      Prior Function            PT Goals (current goals can now be found in the care plan section) Acute Rehab PT Goals Patient Stated Goal: To go to rehab Potential to Achieve Goals: Fair Progress towards PT goals: Progressing toward goals  Frequency  Min 3X/week    PT Plan Current plan remains appropriate    Co-evaluation             End of Session Equipment Utilized During Treatment: Gait belt Activity Tolerance: Patient limited by pain Patient left: in chair;with call bell/phone within reach;with chair alarm set;with family/visitor present     Time: 6861-6837 PT Time Calculation (min) (ACUTE ONLY): 34 min  Charges:  $Gait Training: 8-22 mins $Therapeutic Exercise: 8-22 mins                    G Codes:       John [name] 07-25-15, 4:18 PM  Joycelyn Rua, PTA pager [phone]

## 2015-06-30 NOTE — NC FL2 (Signed)
MEDICAID FL2 LEVEL OF CARE SCREENING TOOL     IDENTIFICATION  Patient Name: John [name] Birthdate: 12/04/1922 Sex: male Admission Date (Current Location): 06/28/2015  [facility] and [place] Number:  Producer, television/film/video and Address:  The [place], [address] [place], [place] [address]      Provider Number: [id number]  Attending Physician Name and Address:  Starleen Arms, MD  Relative Name and Phone Number:       Current Level of Care: Hospital Recommended Level of Care: Skilled Nursing Facility Prior Approval Number:    Date Approved/Denied:   PASRR Number:   [id number]  Discharge Plan: SNF    Current Diagnoses: Patient Active Problem List   Diagnosis Date Noted  . Cellulitis 06/28/2015  . CHF (congestive heart failure) (HCC)   . Diabetes mellitus with complication (HCC)   . Nonhealing ulcer of left lower extremity (HCC) 06/17/2015  . Acute osteomyelitis of ankle and foot (HCC) 06/09/2015  . Atherosclerosis of artery of extremity with ulceration (HCC) 01/21/2014  . Vascular dementia, uncomplicated 09/18/2012  . Unspecified late effects of cerebrovascular disease 09/18/2012  . Edema 04/05/2010  . [employer] 04/05/2010  . Ventricular tachycardia (HCC)   . Atrial fibrillation (HCC)   . CAD (coronary artery disease)   . Diabetes (HCC) 01/12/2009  . GILBERT'S SYNDROME 01/12/2009  . Essential hypertension 01/12/2009  . CARDIOMYOPATHY, ISCHEMIC 01/12/2009  . VENTRICULAR TACHYCARDIA 01/12/2009  . SICK SINUS/ TACHY-BRADY SYNDROME 01/12/2009    Orientation RESPIRATION BLADDER Height & Weight     Self  O2 (at 2L) Continent Weight: 257 lb 15 oz (117 kg) Height:  5' 9.5" (176.5 cm)  BEHAVIORAL SYMPTOMS/MOOD NEUROLOGICAL BOWEL NUTRITION STATUS   (NONE )  (NONE ) Continent Diet (CARB MODIFIED )  AMBULATORY STATUS COMMUNICATION OF NEEDS Skin   Limited Assist Verbally Surgical wounds                      Personal Care Assistance Level of Assistance  Bathing, Feeding, Dressing Bathing Assistance: Limited assistance Feeding assistance: Limited assistance Dressing Assistance: Limited assistance     Functional Limitations Info  Sight, Hearing, Speech Sight Info: Adequate Hearing Info: Adequate Speech Info: Adequate    SPECIAL CARE FACTORS FREQUENCY  PT (By licensed PT)     PT Frequency: 3              Contractures      Additional Factors Info  Insulin Sliding Scale, Code Status, Allergies Code Status Info: FULL CODE  Allergies Info: N/A    Insulin Sliding Scale Info: 3 X per day       Current Medications (06/30/2015):  This is the current hospital active medication list Current Facility-Administered Medications  Medication Dose Route Frequency Provider Last Rate Last Dose  . acetaminophen (TYLENOL) tablet 650 mg  650 mg Oral Q6H PRN Gwenyth Bender, NP       Or  . acetaminophen (TYLENOL) suppository 650 mg  650 mg Rectal Q6H PRN Gwenyth Bender, NP      . amiodarone (PACERONE) tablet 200 mg  200 mg Oral Daily Lesle Chris Black, NP   200 mg at 06/30/15 1023  . carvedilol (COREG) tablet 6.25 mg  6.25 mg Oral BID WC Gwenyth Bender, NP   6.25 mg at 06/30/15 0845  . ceFEPIme (MAXIPIME) 1 g in dextrose 5 % 50 mL IVPB  1 g Intravenous Q8H Almon Hercules,  RPH   1 g at 06/30/15 1353  . cholecalciferol (VITAMIN D) tablet 1,000 Units  1,000 Units Oral Daily Gwenyth Bender, NP   1,000 Units at 06/30/15 1022  . escitalopram (LEXAPRO) tablet 10 mg  10 mg Oral Daily Gwenyth Bender, NP   10 mg at 06/30/15 1022  . HYDROcodone-acetaminophen (NORCO/VICODIN) 5-325 MG per tablet 1-2 tablet  1-2 tablet Oral Q4H PRN Gwenyth Bender, NP   2 tablet at 06/30/15 0309  . insulin aspart (novoLOG) injection 0-15 Units  0-15 Units Subcutaneous TID WC Gwenyth Bender, NP   2 Units at 06/30/15 1353  . insulin aspart (novoLOG) injection 0-5 Units  0-5 Units Subcutaneous QHS Gwenyth Bender, NP   0 Units at 06/28/15  2222  . memantine (NAMENDA XR) 24 hr capsule 28 mg  28 mg Oral Daily Lesle Chris Black, NP   28 mg at 06/30/15 1022  . ondansetron (ZOFRAN) tablet 4 mg  4 mg Oral Q6H PRN Gwenyth Bender, NP       Or  . ondansetron [facility]) injection 4 mg  4 mg Intravenous Q6H PRN Gwenyth Bender, NP      . pravastatin (PRAVACHOL) tablet 40 mg  40 mg Oral Daily Lesle Chris Black, NP   40 mg at 06/30/15 1022  . rivastigmine (EXELON) 9.5 mg/24hr 9.5 mg  9.5 mg Transdermal Daily Lesle Chris Black, NP   9.5 mg at 06/30/15 1025  . senna-docusate (Senokot-S) tablet 1 tablet  1 tablet Oral QHS PRN Gwenyth Bender, NP      . vancomycin (VANCOCIN) 1,250 mg in sodium chloride 0.9 % 250 mL IVPB  1,250 mg Intravenous Q24H Almon Hercules, RPH   1,250 mg at 06/30/15 1353  . [START ON 07/04/2015] Vitamin D (Ergocalciferol) (DRISDOL) capsule 50,000 Units  50,000 Units Oral Q Sun Gwenyth Bender, NP         Discharge Medications: Please see discharge summary for a list of discharge medications.  Relevant Imaging Results:  Relevant Lab Results:   Additional Information SSN [id number]  Vaughan Browner, LCSW

## 2015-06-30 NOTE — Clinical Social Work Placement (Signed)
   CLINICAL SOCIAL WORK PLACEMENT  NOTE  Date:  06/30/2015  Patient Details  Name: John [name] MRN: [id number] Date of Birth: 03-Jun-1922  Clinical Social Work is seeking post-discharge placement for this patient at the Skilled  Nursing Facility level of care (*CSW will initial, date and re-position this form in  chart as items are completed):  Yes   Patient/family provided with [facility] Clinical Social Work Department's list of facilities offering this level of care within the geographic area requested by the patient (or if unable, by the patient's family).  Yes   Patient/family informed of their freedom to choose among providers that offer the needed level of care, that participate in Medicare, Medicaid or managed care program needed by the patient, have an available bed and are willing to accept the patient.  Yes   Patient/family informed of [facility]'s ownership interest in [facility] and [facility], as well as of the fact that they are under no obligation to receive care at these facilities.  PASRR submitted to EDS on 06/30/15     PASRR number received on 06/30/15     Existing PASRR number confirmed on       FL2 transmitted to all facilities in geographic area requested by pt/family on 06/30/15     FL2 transmitted to all facilities within larger geographic area on       Patient informed that his/her managed care company has contracts with or will negotiate with certain facilities, including the following:            Patient/family informed of bed offers received.  Patient chooses bed at       Physician recommends and patient chooses bed at      Patient to be transferred to   on  .  Patient to be transferred to facility by       Patient family notified on   of transfer.  Name of family member notified:        PHYSICIAN Please sign FL2     Additional Comment:    _______________________________________________ Vaughan Browner, LCSW 06/30/2015,  2:22 PM

## 2015-06-30 NOTE — Progress Notes (Signed)
Subjective:  John [name] is POD #2 s/p left partial 2nd ray amputation. He is doing well. Patients wife and sons at bedside. States he is anxious at times but doing well. No new concerns.  Objective: Blood pressure 112/49, pulse 70, temperature 98.3 F (36.8 C), temperature source Axillary, resp. rate 18, height 5' 9.5" (1.765 m), weight 257 lb 15 oz (117 kg), SpO2 93 %. NAD, sleeping but opening eyes Bandage this clean, dry, intact with no strike through. Upon removal to damage the drain was removed in total.  Incision is well coapted without any evidence of dehiscence. There is minimal edema of the surgical site and there is no erythema or increase in warmth. There is no drainage or pus expressed expressed. No ascending cellulitis. No fluctuance or crepitance. Chronic erythema to the left leg is somewhat improved as well status post surgery. No pain with calf compression. No other open lesions of the left at this time.  Assessment: POD #2 s/p left partial 2nd ray amputation; doing well  Plan: -Continue IV antibitoics while inpatients. Follow-up with culture/pathology -WBAT in CAM boot. He does minimal walking at baseline. -Dressing was changed today and the drain was removed. Xerform and DSD applied. Keep dressing clean, dry, intact.  -Once discharged likely 2 weeks of PO abx, possibly augmentin. Follow culture/pathology.  -I will go ahead and make him a follow-up to see me on Monday in the [place] office.  -PT -If you have any questions please feel free to call me directly at [phone] or you can call the office at [phone].   Ovid Curd, DPM

## 2015-06-30 NOTE — Progress Notes (Signed)
PROGRESS NOTE    John [name]  QQP:[id number] DOB: [birth year] DOA: 06/28/2015 PCP: Ezequiel Kayser, MD   Outpatient Specialists:  Dr. Ardelle Anton (Podiatry)   Brief Narrative:  John [name] is a very pleasant and drowsy 82 y.o. male with medical history significant for nonhealing ulcer of left lower extremity, diabetes, CAD, A. fib status post pacemaker, hypertension, osteomyelitis being admitted status post amputation second left toe.  Information is obtained from the chart and the podiatrist. Patient with a history left second toe osteomyelitis and ulceration. Chart review indicates patient had been on antibiotics for "quite some time and underwent local wound care and wound was nonhealing". He was evaluated 5 days ago recommendation dictation of the left toe. Patient admitted status post amputation of left toe. Dr. Ardelle Anton moved left toe Penrose drain applied. Note indicates no pus but serosanguineous drainage. No report or documentation of fever chills leukocytosis. No report of nausea vomiting diarrhea. No complaints of any chest pain palpitations headache dizziness syncope or near-syncope. No dysuria hematuria frequency or urgency.    Assessment & Plan:   Principal Problem:   Cellulitis Active Problems:   Diabetes (HCC)   Essential hypertension   Atrial fibrillation (HCC)   CAD (coronary artery disease)   Pacemaker-CRT   [employer]   Vascular dementia, uncomplicated   CHF (congestive heart failure) (HCC)   Diabetes mellitus with complication (HCC)   Cellulitis left lower leg/osteomylitis/non-healing diabetic foot ulcer  - post amputation of left second toe the setting of peripheral vascular disease.  -Follow cultures sent by podiatry -Antibiotics per protocol for potential MRSA and Pseudomonas -wound care per podiatry -Pain management  A. fib.  - Not on anticoagulation secondary to history of subdural hematoma [place] vasc score 4. Controlled. -  Coreg  CAD/CHF/peripheral vascular disease - status post pacemaker.  - Denies any chest pain or shortness of breath  - Continue home Medications include beta blocker, amiodarone. Echo 2011 reveals mild LVH, EF 40-45%, moderate hypokinesis. Chart review indicates device interrogated March 2017. -Monitor intake and output  Hypertension -Monitor closely -Continue home meds with parameters  Diabetes - hemoglobin A1c: 5.8 -hold metformin -Sliding scale insulin  Vascular dementia.  - Appears stable at baseline. -Continue home meds   DVT prophylaxis:  SCD's  Code Status: Full Code   Family Communication: Wife at bedside  Disposition Plan:  SNF in am.   Consultants:   Podiatry  Procedures:  Left 2nd digit amputation Dr. Loreta Ave on 6/19     Subjective: No complaints Pleasantly confused  Objective: Filed Vitals:   06/29/15 1342 06/29/15 2218 06/30/15 0557 06/30/15 1356  BP: 117/56 116/54 112/49 133/64  Pulse: 69 70 70 70  Temp: 98.9 F (37.2 C) 98.3 F (36.8 C) 98.3 F (36.8 C) 98.6 F (37 C)  TempSrc: Oral Axillary Axillary Oral  Resp: 18 18  18   Height:      Weight:      SpO2: 95% 91% 93% 94%    Intake/Output Summary (Last 24 hours) at 06/30/15 1632 Last data filed at 06/30/15 1356  Gross per 24 hour  Intake    360 ml  Output    550 ml  Net   -190 ml   Filed Weights   06/28/15 0626 06/28/15 0707 06/28/15 1030  Weight: 110.224 kg (243 lb) 110.224 kg (243 lb) 117 kg (257 lb 15 oz)    Examination:  General exam: Appears calm and comfortable-- pleasantly confused Respiratory system: Clear to auscultation. Respiratory effort normal. Cardiovascular  system: iRRR, No rubs murmurs gallops Gastrointestinal system: Abdomen is nondistended, soft and nontender. Normal bowel sounds heard. Skin: left leg wrapped    Data Reviewed: I have personally reviewed following labs and imaging studies  CBC:  Recent Labs Lab 06/28/15 1037 06/29/15 0534   WBC 4.5 4.5  NEUTROABS 2.6  --   HGB 10.1* 9.8*  HCT 32.6* 31.9*  MCV 101.9* 103.9*  PLT 140* 149*   Basic Metabolic Panel:  Recent Labs Lab 06/28/15 1037 06/29/15 0534  NA 141 140  K 3.8 4.2  CL 109 108  CO2 26 28  GLUCOSE 114* 127*  BUN 15 16  CREATININE 1.10 1.07  CALCIUM 8.7* 8.7*   GFR: Estimated Creatinine Clearance: 58.3 mL/min (by C-G formula based on Cr of 1.07). Liver Function Tests: No results for input(s): AST, ALT, ALKPHOS, BILITOT, PROT, ALBUMIN in the last 168 hours. No results for input(s): LIPASE, AMYLASE in the last 168 hours. No results for input(s): AMMONIA in the last 168 hours. Coagulation Profile: No results for input(s): INR, PROTIME in the last 168 hours. Cardiac Enzymes: No results for input(s): CKTOTAL, CKMB, CKMBINDEX, TROPONINI in the last 168 hours. BNP (last 3 results) No results for input(s): PROBNP in the last 8760 hours. HbA1C:  Recent Labs  06/28/15 1037  HGBA1C 5.8*   CBG:  Recent Labs Lab 06/29/15 1158 06/29/15 1647 06/29/15 2035 06/30/15 0749 06/30/15 1146  GLUCAP 153* 128* 123* 102* 137*   Lipid Profile: No results for input(s): CHOL, HDL, LDLCALC, TRIG, CHOLHDL, LDLDIRECT in the last 72 hours. Thyroid Function Tests: No results for input(s): TSH, T4TOTAL, FREET4, T3FREE, THYROIDAB in the last 72 hours. Anemia Panel: No results for input(s): VITAMINB12, FOLATE, FERRITIN, TIBC, IRON, RETICCTPCT in the last 72 hours. Urine analysis:    Component Value Date/Time   COLORURINE YELLOW 02/23/2009 2225   APPEARANCEUR CLOUDY* 02/23/2009 2225   LABSPEC 1.013 02/23/2009 2225   PHURINE 6.0 02/23/2009 2225   GLUCOSEU NEGATIVE 02/23/2009 2225   HGBUR LARGE* 02/23/2009 2225   BILIRUBINUR NEGATIVE 02/23/2009 2225   KETONESUR NEGATIVE 02/23/2009 2225   PROTEINUR NEGATIVE 02/23/2009 2225   UROBILINOGEN 1.0 02/23/2009 2225   NITRITE POSITIVE* 02/23/2009 2225   LEUKOCYTESUR LARGE* 02/23/2009 2225      Recent Results  (from the past 240 hour(s))  Aerobic/Anaerobic Culture (surgical/deep wound)     Status: None (Preliminary result)   Collection Time: 06/28/15  8:46 AM  Result Value Ref Range Status   Specimen Description TISSUE LEFT TOE  Final   Special Requests SMALL PIECE LEFT SECOND TOE CLEAN MARGIN  Final   Gram Stain   Final    RARE WBC PRESENT, PREDOMINANTLY PMN RARE GRAM VARIABLE ROD    Culture   Final    NORMAL SKIN FLORA NO ANAEROBES ISOLATED; CULTURE IN PROGRESS FOR 5 DAYS    Report Status PENDING  Incomplete  Aerobic/Anaerobic Culture (surgical/deep wound)     Status: None (Preliminary result)   Collection Time: 06/28/15 10:25 AM  Result Value Ref Range Status   Specimen Description TISSUE LEFT TOE  Final   Special Requests LEFT SECOND TOE CLEAN MARGIN LARGE SPECIMEN 2  Final   Gram Stain   Final    RARE WBC PRESENT, PREDOMINANTLY PMN NO ORGANISMS SEEN    Culture   Final    NORMAL SKIN FLORA NO ANAEROBES ISOLATED; CULTURE IN PROGRESS FOR 5 DAYS    Report Status PENDING  Incomplete  Blood Cultures x 2 sites  Status: None (Preliminary result)   Collection Time: 06/28/15 10:49 AM  Result Value Ref Range Status   Specimen Description BLOOD RIGHT FOREARM  Final   Special Requests BOTTLES DRAWN AEROBIC AND ANAEROBIC 10CC   Final   Culture NO GROWTH 2 DAYS  Final   Report Status PENDING  Incomplete  Blood Cultures x 2 sites     Status: None (Preliminary result)   Collection Time: 06/28/15 10:55 AM  Result Value Ref Range Status   Specimen Description BLOOD RIGHT ANTECUBITAL  Final   Special Requests IN PEDIATRIC BOTTLE  3CC  Final   Culture NO GROWTH 2 DAYS  Final   Report Status PENDING  Incomplete      Anti-infectives    Start     Dose/Rate Route Frequency Ordered Stop   06/29/15 1300  vancomycin (VANCOCIN) 1,250 mg in sodium chloride 0.9 % 250 mL IVPB     1,250 mg 166.7 mL/hr over 90 Minutes Intravenous Every 24 hours 06/28/15 1439     06/28/15 2100  ceFEPIme (MAXIPIME)  1 g in dextrose 5 % 50 mL IVPB     1 g 100 mL/hr over 30 Minutes Intravenous Every 8 hours 06/28/15 1439     06/28/15 1045  vancomycin (VANCOCIN) 2,000 mg in sodium chloride 0.9 % 500 mL IVPB     2,000 mg 250 mL/hr over 120 Minutes Intravenous  Once 06/28/15 1038 06/28/15 1501   06/28/15 1045  ceFEPIme (MAXIPIME) 2 g in dextrose 5 % 50 mL IVPB     2 g 100 mL/hr over 30 Minutes Intravenous  Once 06/28/15 1038 06/28/15 1255   06/28/15 0900  cefTRIAXone (ROCEPHIN) 2 g in dextrose 5 % 50 mL IVPB  Status:  Discontinued     2 g 100 mL/hr over 30 Minutes Intravenous Every 24 hours 06/28/15 0849 06/28/15 0911   06/28/15 0900  metroNIDAZOLE (FLAGYL) IVPB 500 mg  Status:  Discontinued     500 mg 100 mL/hr over 60 Minutes Intravenous Every 8 hours 06/28/15 0849 06/28/15 0911   06/28/15 0700  ceFAZolin (ANCEF) 3 g in dextrose 5 % 50 mL IVPB     3 g 130 mL/hr over 30 Minutes Intravenous To [facility] 06/27/15 1051 06/28/15 0746       Radiology Studies: Dg Foot 2 Views Left  06/29/2015  CLINICAL DATA:  Status post amputation of the second toe. EXAM: LEFT FOOT - 2 VIEW COMPARISON:  04/30/2015 FINDINGS: Surgical changes from amputation of the second toe and also the distal aspect of the second metatarsal head. They drain is in place. The other bony structures are unremarkable. Small vessel calcifications are noted. IMPRESSION: Status post amputation of the second toe and distal aspect of the second metatarsal head. Electronically Signed   By: Rudie Meyer M.D.   On: 06/29/2015 21:48        Scheduled Meds: . amiodarone  200 mg Oral Daily  . carvedilol  6.25 mg Oral BID WC  . ceFEPime (MAXIPIME) IV  1 g Intravenous Q8H  . cholecalciferol  1,000 Units Oral Daily  . escitalopram  10 mg Oral Daily  . insulin aspart  0-15 Units Subcutaneous TID WC  . insulin aspart  0-5 Units Subcutaneous QHS  . memantine  28 mg Oral Daily  . pravastatin  40 mg Oral Daily  . rivastigmine  9.5 mg  Transdermal Daily  . vancomycin  1,250 mg Intravenous Q24H  . [START ON 07/04/2015] Vitamin D (Ergocalciferol)  50,000 Units Oral  Q Sun   Continuous Infusions:       Time spent: 25 min    Jarmaine Ehrler, MD [facility] Pager [phone]  If 7PM-7AM, please contact night-coverage [email or web address] Password [facility] 06/30/2015, 4:32 PM

## 2015-06-30 NOTE — Clinical Social Work Note (Signed)
Clinical Social Work Assessment  Patient Details  Name: John [name] MRN: [id number] Date of Birth: 11-Aug-1922  Date of referral:  06/30/15               Reason for consult:  Facility Placement, Discharge Planning                Permission sought to share information with:  Family Supports, Magazine features editor, Case Estate manager/land agent granted to share information::  Yes, Verbal Permission Granted  Name::      Renae Fickle [place] )  Agency::   (SNF's )  Relationship::   (Son)  Contact Information:   [phone])  Housing/Transportation Living arrangements for the past 2 months:  Independent Living Facility Source of Information:  Adult Children Patient Interpreter Needed:  None Criminal Activity/Legal Involvement Pertinent to Current Situation/Hospitalization:  No - Comment as needed Significant Relationships:  Spouse, Adult Children, Siblings Lives with:  Spouse, Facility Resident Do you feel safe going back to the place where you live?  No Need for family participation in patient care:  Yes (Comment)  Care giving concerns:  Patient requiring short-term rehab placement for PT, IV abx, and wound care.   Social Worker assessment / plan:  Visual merchandiser spoke with pt's son, Renae Fickle in regards to post-acute placement for SNF. CSW introduced CSW role and SNF. CSW reviewed and provided SNF list. Patient's son confirmed that patient was admitted from [facility] where he lives with his wife. Pt's son reported that patient has never been in SNF for ST rehab however family has heard good things about [facility] which would be their first preference. Son agreeable to pt being faxed out to other facilities as well. No further concerns reported at this time. CSW remains available as needed.   Employment status:  Retired Database administrator PT Recommendations:  Skilled Nursing Facility Information / Referral to community resources:  Skilled  Nursing Facility  Patient/Family's Response to care:  Pt disoriented x3. Family agreeable to SNF placement. Family supportive and involved in pt's care. Son appreciated social work intervention.  Patient/Family's Understanding of and Emotional Response to Diagnosis, Current Treatment, and Prognosis:  Son stated that he received updates from MD on yesterday in regards to d/c planning and pt's condition.   Emotional Assessment Appearance:  Appears stated age Attitude/Demeanor/Rapport:  Unable to Assess Affect (typically observed):  Unable to Assess Orientation:  Oriented to Self Alcohol / Substance use:  Not Applicable Psych involvement (Current and /or in the community):  No (Comment)  Discharge Needs  Concerns to be addressed:  Care Coordination Readmission within the last 30 days:  No Current discharge risk:  Cognitively Impaired, Dependent with Mobility Barriers to Discharge:  Continued Medical Work up   [employer], MSW, LCSWA [phone] 06/30/2015 2:18 PM

## 2015-07-01 DIAGNOSIS — I482 Chronic atrial fibrillation: Secondary | ICD-10-CM | POA: Diagnosis not present

## 2015-07-01 DIAGNOSIS — M868X4 Other osteomyelitis, hand: Secondary | ICD-10-CM | POA: Diagnosis not present

## 2015-07-01 DIAGNOSIS — E1159 Type 2 diabetes mellitus with other circulatory complications: Secondary | ICD-10-CM

## 2015-07-01 DIAGNOSIS — I2510 Atherosclerotic heart disease of native coronary artery without angina pectoris: Secondary | ICD-10-CM | POA: Diagnosis not present

## 2015-07-01 DIAGNOSIS — L03116 Cellulitis of left lower limb: Secondary | ICD-10-CM | POA: Diagnosis not present

## 2015-07-01 LAB — GLUCOSE, CAPILLARY
GLUCOSE-CAPILLARY: 93 mg/dL (ref 65–99)
GLUCOSE-CAPILLARY: 129 mg/dL — AB (ref 65–99)

## 2015-07-01 MED ORDER — INSULIN ASPART 100 UNIT/ML ~~LOC~~ SOLN: 0.0000 [IU] | Freq: Three times a day (TID) | SUBCUTANEOUS | Status: DC

## 2015-07-01 MED ORDER — RISAQUAD PO CAPS
2.0000 | ORAL_CAPSULE | Freq: Every day | ORAL | Status: DC
  Administered 2015-07-01: 2 via ORAL
  Filled 2015-07-01: qty 2

## 2015-07-01 MED ORDER — RISAQUAD PO CAPS: 2.0000 | ORAL_CAPSULE | Freq: Every day | ORAL | Status: AC

## 2015-07-01 MED ORDER — AMOXICILLIN-POT CLAVULANATE 875-125 MG PO TABS
1.0000 | ORAL_TABLET | Freq: Two times a day (BID) | ORAL | Status: DC
  Administered 2015-07-01: 1 via ORAL
  Filled 2015-07-01: qty 1

## 2015-07-01 MED ORDER — ACETAMINOPHEN 325 MG PO TABS: 650.0000 mg | ORAL_TABLET | Freq: Four times a day (QID) | ORAL | Status: DC | PRN

## 2015-07-01 MED ORDER — AMOXICILLIN-POT CLAVULANATE 875-125 MG PO TABS: 1.0000 | ORAL_TABLET | Freq: Two times a day (BID) | ORAL | Status: AC

## 2015-07-01 MED ORDER — HYDROCODONE-ACETAMINOPHEN 5-325 MG PO TABS: 1.0000 | ORAL_TABLET | Freq: Four times a day (QID) | ORAL | Status: DC | PRN

## 2015-07-01 MED ORDER — SENNOSIDES-DOCUSATE SODIUM 8.6-50 MG PO TABS: 1.0000 | ORAL_TABLET | Freq: Every day | ORAL | Status: DC

## 2015-07-01 NOTE — Clinical Social Work Placement (Addendum)
   CLINICAL SOCIAL WORK PLACEMENT  NOTE  Date:  07/01/2015  Patient Details  Name: John [name] MRN: [id number] Date of Birth: March 28, 1922  Clinical Social Work is seeking post-discharge placement for this patient at the Skilled  Nursing Facility level of care (*CSW will initial, date and re-position this form in  chart as items are completed):  Yes   Patient/family provided with [place] Clinical Social Work Department's list of facilities offering this level of care within the geographic area requested by the patient (or if unable, by the patient's family).  Yes   Patient/family informed of their freedom to choose among providers that offer the needed level of care, that participate in Medicare, Medicaid or managed care program needed by the patient, have an available bed and are willing to accept the patient.  Yes   Patient/family informed of [place]'s ownership interest in [facility] and [facility], as well as of the fact that they are under no obligation to receive care at these facilities.  PASRR submitted to EDS on 06/30/15     PASRR number received on 06/30/15     Existing PASRR number confirmed on       FL2 transmitted to all facilities in geographic area requested by pt/family on 06/30/15     FL2 transmitted to all facilities within larger geographic area on       Patient informed that his/her managed care company has contracts with or will negotiate with certain facilities, including the following:        Yes   Patient/family informed of bed offers received.  Patient chooses bed at [facility]     Physician recommends and patient chooses bed at      Patient to be transferred to [facility] on 07/01/15.  Patient to be transferred to facility by [facility]     Patient family notified on 07/01/15 of transfer.  Name of family member notified:  son, Renae Fickle and patient's wife     PHYSICIAN Please sign FL2     Additional Comment:     _______________________________________________ Rondel Baton, LCSW 07/01/2015, 10:38 AM

## 2015-07-01 NOTE — Clinical Social Work Psych Note (Signed)
Patient will discharge today per MD order. Patient will discharge to: [facility] RN to call report prior to transportation to: [phone] Transportation: [facility] to be called at 1:00 per SNF request.  Son was updated via phone (left message)  CSW sent discharge summary to SNF for review.    Vickii Penna, LCSW [phone]  5N1-9, 2S 15-16 and Psychiatric Service Line  Licensed Clinical Social Worker

## 2015-07-01 NOTE — Discharge Summary (Signed)
John [name], is a 82 y.o. male  DOB [birth year]  MRN [id number].  Admission date:  06/28/2015  Admitting Physician  Ozella Rocks, MD  Discharge Date:  07/01/2015   Primary MD  Ezequiel Kayser, MD  Recommendations for primary care physician for things to follow:  - Patient to follow with podiatry Dr. Loreta Ave this coming Monday - Check CBC, BMP in 5 days.   Admission Diagnosis  osteomylitis   Discharge Diagnosis  osteomylitis    Principal Problem:   Cellulitis Active Problems:   Diabetes (HCC)   Essential hypertension   Atrial fibrillation (HCC)   CAD (coronary artery disease)   Pacemaker-CRT   [employer]   Vascular dementia, uncomplicated   CHF (congestive heart failure) (HCC)   Diabetes mellitus with complication (HCC)      Past Medical History  Diagnosis Date  . Ventricular tachycardia (HCC)     Syncope-2010 treated with amiodarone  . Subdural hematoma (HCC) 11/05    Precluding Coumadin  . Atrial fibrillation (HCC)     Permanent  . HTN (hypertension)   . CAD (coronary artery disease)     s/p CABG; Failed LAD PCI 2010  . HLD (hyperlipidemia)   . BPH (benign prostatic hyperplasia)   . Decreased hearing   . Gilbert syndrome   . Carotid artery occlusion   . Dementia   . DM2 (diabetes mellitus, type 2) (HCC)   . Memory loss   . CHF (congestive heart failure) (HCC)   . Dysrhythmia     hx atrial fib ,tachycardia  . AICD (automatic cardioverter/defibrillator) present   . Presence of permanent cardiac pacemaker   . Stroke [facility]) ?10  . Headache   . Arthritis     Past Surgical History  Procedure Laterality Date  . Coronary artery bypass graft  1998  . Hemorrhoid surgery    . Pacemaker insertion  ?10    [employer]  . Peripheral vascular catheterization N/A 05/17/2015    Procedure: Abdominal Aortogram w/Lower Extremity;  Surgeon: Chuck Hint, MD;   Location: [facility] INVASIVE CV LAB;  Service: Cardiovascular;  Laterality: N/A;  . Brain surgery  05    subdural hematoma  . Toe amputation Left 06/28/2015    2nd toe  . Amputation Left 06/28/2015    Procedure: AMPUTATION SECOND LEFT TOE;  Surgeon: Vivi Barrack, DPM;  Location: [facility] OR;  Service: Podiatry;  Laterality: Left;       History of present illness and  Hospital Course:     Kindly see H&P for history of present illness and admission details, please review complete Labs, Consult reports and Test reports for all details in brief  HPI  from the history and physical done on the day of admission 06/28/2015 John [name] is a very pleasant and drowsy 82 y.o. male with medical history significant for nonhealing ulcer of left lower extremity, diabetes, CAD, A. fib status post pacemaker, hypertension, osteomyelitis being admitted status post amputation second left toe.  Information is obtained from  the chart and the podiatrist. Patient with a history left second toe osteomyelitis and ulceration. Chart review indicates patient had been on antibiotics for "quite some time and underwent local wound care and wound was nonhealing". He was evaluated 5 days ago recommendation dictation of the left toe. Patient admitted status post amputation of left toe. Dr. Ardelle Anton moved left toe Penrose drain applied. Note indicates no pus but serosanguineous drainage. No report or documentation of fever chills leukocytosis. No report of nausea vomiting diarrhea. No complaints of any chest pain palpitations headache dizziness syncope or near-syncope. No dysuria hematuria frequency or urgency.    Hospital Course  Cellulitis left lower leg/osteomylitis/non-healing diabetic foot ulcer  - post amputation of left second toe on 6/19 in  the setting of peripheral vascular disease -Follow cultures sent by podiatry, so far growing normal skin flora, but final results still pending at time of discharge -Treated with IV  vancomycin and cefepime during hospital stay, we'll discharge on 2 weeks total of by mouth Augmentin podiatry recommendation. -wound care per podiatry, to follow with him this coming Monday.  A. fib.  - Not on anticoagulation secondary to history of subdural hematoma [place] vasc score 4. Controlled. - Coreg  CAD/CHF/peripheral vascular disease - status post pacemaker.  - Denies any chest pain or shortness of breath  - Continue home Medications include beta blocker, amiodarone. Echo 2011 reveals mild LVH, EF 40-45%, moderate hypokinesis. Chart review indicates device interrogated March 2017. -Monitor intake and output  Hypertension -Monitor closely -Continue home meds with parameters  Diabetes - hemoglobin A1c: 5.8 -CBG has been acceptable during hospital stay, we'll discharge on insulin sliding scale, Hold metformin on discharge.  Vascular dementia.  - Appears stable at baseline. -Continue home meds    Discharge Condition:  Stable   Follow UP  Follow-up Information    Follow up with Ezequiel Kayser, MD.   Specialty:  Internal Medicine   Contact information:   [address] [place] [address] [phone]       Go to Ovid Curd, DPM.   Specialty:  Podiatry   Why:  on Monday   Contact information:   [address] [place] [address] [phone]         Discharge Instructions  and  Discharge Medications     Discharge Instructions    Discharge instructions    Complete by:  As directed   ollow with Primary MD Ezequiel Kayser, MD after discharge from SNF  Get CBC, CMP,  checked  by Primary MD next visit.    Activity: -WBAT in CAM boot with Full fall precautions use walker/cane & assistance as needed   Disposition Home    Diet: Heart Healthy , carbohydrate modified, with feeding assistance and aspiration precautions.  For Heart failure patients - Check your Weight same time everyday, if you gain over 2 pounds, or you develop in leg  swelling, experience more shortness of breath or chest pain, call your Primary MD immediately. Follow Cardiac Low Salt Diet and 1.5 lit/day fluid restriction.   On your next visit with your primary care physician please Get Medicines reviewed and adjusted.   Please request your Prim.MD to go over all Hospital Tests and Procedure/Radiological results at the follow up, please get all Hospital records sent to your Prim MD by signing hospital release before you go home.   If you experience worsening of your admission symptoms, develop shortness of breath, life threatening emergency, suicidal or homicidal thoughts you must seek medical  attention immediately by calling 911 or calling your MD immediately  if symptoms less severe.  You Must read complete instructions/literature along with all the possible adverse reactions/side effects for all the Medicines you take and that have been prescribed to you. Take any new Medicines after you have completely understood and accpet all the possible adverse reactions/side effects.   Do not drive, operating heavy machinery, perform activities at heights, swimming or participation in water activities or provide baby sitting services if your were admitted for syncope or siezures until you have seen by Primary MD or a Neurologist and advised to do so again.  Do not drive when taking Pain medications.    Do not take more than prescribed Pain, Sleep and Anxiety Medications  Special Instructions: If you have smoked or chewed Tobacco  in the last 2 yrs please stop smoking, stop any regular Alcohol  and or any Recreational drug use.  Wear Seat belts while driving.   Please note  You were cared for by a hospitalist during your hospital stay. If you have any questions about your discharge medications or the care you received while you were in the hospital after you are discharged, you can call the unit and asked to speak with the hospitalist on call if the hospitalist  that took care of you is not available. Once you are discharged, your primary care physician will handle any further medical issues. Please note that NO REFILLS for any discharge medications will be authorized once you are discharged, as it is imperative that you return to your primary care physician (or establish a relationship with a primary care physician if you do not have one) for your aftercare needs so that they can reassess your need for medications and monitor your lab values.     Discharge wound care:    Complete by:  As directed   Keep dressing clean, dry, intact, till seen by podiatry next Monday, and further instruction will be from him            Medication List    STOP taking these medications        metFORMIN 500 MG tablet  Commonly known as:  GLUCOPHAGE      TAKE these medications        acetaminophen 325 MG tablet  Commonly known as:  TYLENOL  Take 2 tablets (650 mg total) by mouth every 6 (six) hours as needed for mild pain (or Fever >/= 101).     acidophilus Caps capsule  Take 2 capsules by mouth daily.     amiodarone 200 MG tablet  Commonly known as:  PACERONE  Take 200 mg by mouth daily. Sunday,Monday,Wednesday,Thursday, & Friday only     amoxicillin-clavulanate 875-125 MG tablet  Commonly known as:  AUGMENTIN  Take 1 tablet by mouth every 12 (twelve) hours. Take total of 14 days, continue through July 5.     aspirin 81 MG tablet  Take 81 mg by mouth daily.     carvedilol 6.25 MG tablet  Commonly known as:  COREG  Take 1 tablet (6.25 mg total) by mouth 2 (two) times daily with a meal.     ergocalciferol 50000 units capsule  Commonly known as:  VITAMIN D2  Take 50,000 Units by mouth every Sunday. Reported on 05/13/2015     escitalopram 10 MG tablet  Commonly known as:  LEXAPRO  Take 10 mg by mouth daily.     EYE VITAMINS Caps  Take 1 capsule by  mouth 2 (two) times daily.     HYDROcodone-acetaminophen 5-325 MG tablet  Commonly known as:   NORCO/VICODIN  Take 1 tablet by mouth every 6 (six) hours as needed for moderate pain.     insulin aspart 100 UNIT/ML injection  Commonly known as:  novoLOG  Inject 0-15 Units into the skin 3 (three) times daily with meals.     NAMENDA XR 28 MG Cp24 24 hr capsule  Generic drug:  memantine  Take 28 mg by mouth daily.     pravastatin 40 MG tablet  Commonly known as:  PRAVACHOL  Take 40 mg by mouth daily. Reported on 05/13/2015     rivastigmine 9.5 mg/24hr  Commonly known as:  EXELON  Place 1 patch onto the skin daily.     senna-docusate 8.6-50 MG tablet  Commonly known as:  Senokot-S  Take 1 tablet by mouth at bedtime.     Vitamin D3 1000 units Caps  Take 1,000 Units by mouth daily.          Diet and Activity recommendation: See Discharge Instructions above   Consults obtained -  podiatry   Major procedures and Radiology Reports - PLEASE review detailed and final reports for all details, in brief -  Left 2nd digit amputation Dr. Loreta Ave on 6/19   Dg Foot 2 Views Left  06/29/2015  CLINICAL DATA:  Status post amputation of the second toe. EXAM: LEFT FOOT - 2 VIEW COMPARISON:  04/30/2015 FINDINGS: Surgical changes from amputation of the second toe and also the distal aspect of the second metatarsal head. They drain is in place. The other bony structures are unremarkable. Small vessel calcifications are noted. IMPRESSION: Status post amputation of the second toe and distal aspect of the second metatarsal head. Electronically Signed   By: Rudie Meyer M.D.   On: 06/29/2015 21:48    Micro Results    Recent Results (from the past 240 hour(s))  Aerobic/Anaerobic Culture (surgical/deep wound)     Status: None (Preliminary result)   Collection Time: 06/28/15  8:46 AM  Result Value Ref Range Status   Specimen Description TISSUE LEFT TOE  Final   Special Requests SMALL PIECE LEFT SECOND TOE CLEAN MARGIN  Final   Gram Stain   Final    RARE WBC PRESENT, PREDOMINANTLY PMN RARE  GRAM VARIABLE ROD    Culture   Final    NORMAL SKIN FLORA NO ANAEROBES ISOLATED; CULTURE IN PROGRESS FOR 5 DAYS    Report Status PENDING  Incomplete  Aerobic/Anaerobic Culture (surgical/deep wound)     Status: None (Preliminary result)   Collection Time: 06/28/15 10:25 AM  Result Value Ref Range Status   Specimen Description TISSUE LEFT TOE  Final   Special Requests LEFT SECOND TOE CLEAN MARGIN LARGE SPECIMEN 2  Final   Gram Stain   Final    RARE WBC PRESENT, PREDOMINANTLY PMN NO ORGANISMS SEEN    Culture   Final    NORMAL SKIN FLORA NO ANAEROBES ISOLATED; CULTURE IN PROGRESS FOR 5 DAYS    Report Status PENDING  Incomplete  Blood Cultures x 2 sites     Status: None (Preliminary result)   Collection Time: 06/28/15 10:49 AM  Result Value Ref Range Status   Specimen Description BLOOD RIGHT FOREARM  Final   Special Requests BOTTLES DRAWN AEROBIC AND ANAEROBIC 10CC   Final   Culture NO GROWTH 2 DAYS  Final   Report Status PENDING  Incomplete  Blood Cultures x 2 sites  Status: None (Preliminary result)   Collection Time: 06/28/15 10:55 AM  Result Value Ref Range Status   Specimen Description BLOOD RIGHT ANTECUBITAL  Final   Special Requests IN PEDIATRIC BOTTLE  3CC  Final   Culture NO GROWTH 2 DAYS  Final   Report Status PENDING  Incomplete       Today   Subjective:   John [name] today has no headache,no chest or abdominal pain, feels  better  today.   Objective:   Blood pressure 131/68, pulse 70, temperature 98.2 F (36.8 C), temperature source Oral, resp. rate 20, height 5' 9.5" (1.765 m), weight 117 kg (257 lb 15 oz), SpO2 100 %.   Intake/Output Summary (Last 24 hours) at 07/01/15 1025 Last data filed at 07/01/15 0630  Gross per 24 hour  Intake    290 ml  Output   1700 ml  Net  -1410 ml    Exam General exam: Appears calm and comfortable-- pleasantly confused Respiratory system: Clear to auscultation. Respiratory effort normal. Cardiovascular system:  iRRR, No rubs murmurs gallops Gastrointestinal system: Abdomen is nondistended, soft and nontender. Normal bowel sounds heard. Skin: left foot  Wrapped  Data Review   CBC w Diff: Lab Results  Component Value Date   WBC 4.5 06/29/2015   HGB 9.8* 06/29/2015   HCT 31.9* 06/29/2015   PLT 149* 06/29/2015   LYMPHOPCT 28 06/28/2015   MONOPCT 12 06/28/2015   EOSPCT 3 06/28/2015   BASOPCT 1 06/28/2015    CMP: Lab Results  Component Value Date   NA 140 06/29/2015   K 4.2 06/29/2015   CL 108 06/29/2015   CO2 28 06/29/2015   BUN 16 06/29/2015   CREATININE 1.07 06/29/2015   PROT 7.4 06/18/2012   ALBUMIN 3.9 06/18/2012   BILITOT 1.9* 06/18/2012   ALKPHOS 43 06/18/2012   AST 21 06/18/2012   ALT 14 06/18/2012  .   Total Time in preparing paper work, data evaluation and todays exam - 35 minutes  Holden Draughon M.D on 07/01/2015 at 10:25 AM  [facility]   Office  [phone]

## 2015-07-01 NOTE — Discharge Instructions (Signed)
Follow with Primary MD Ezequiel Kayser, MD after discharge from SNF  Get CBC, CMP,  checked  by Primary MD next visit.    Activity: -WBAT in CAM boot with Full fall precautions use walker/cane & assistance as needed   Disposition Home    Diet: Heart Healthy , carbohydrate modified, with feeding assistance and aspiration precautions.  For Heart failure patients - Check your Weight same time everyday, if you gain over 2 pounds, or you develop in leg swelling, experience more shortness of breath or chest pain, call your Primary MD immediately. Follow Cardiac Low Salt Diet and 1.5 lit/day fluid restriction.   On your next visit with your primary care physician please Get Medicines reviewed and adjusted.   Please request your Prim.MD to go over all Hospital Tests and Procedure/Radiological results at the follow up, please get all Hospital records sent to your Prim MD by signing hospital release before you go home.   If you experience worsening of your admission symptoms, develop shortness of breath, life threatening emergency, suicidal or homicidal thoughts you must seek medical attention immediately by calling 911 or calling your MD immediately  if symptoms less severe.  You Must read complete instructions/literature along with all the possible adverse reactions/side effects for all the Medicines you take and that have been prescribed to you. Take any new Medicines after you have completely understood and accpet all the possible adverse reactions/side effects.   Do not drive, operating heavy machinery, perform activities at heights, swimming or participation in water activities or provide baby sitting services if your were admitted for syncope or siezures until you have seen by Primary MD or a Neurologist and advised to do so again.  Do not drive when taking Pain medications.    Do not take more than prescribed Pain, Sleep and Anxiety Medications  Special Instructions: If you have smoked  or chewed Tobacco  in the last 2 yrs please stop smoking, stop any regular Alcohol  and or any Recreational drug use.  Wear Seat belts while driving.   Please note  You were cared for by a hospitalist during your hospital stay. If you have any questions about your discharge medications or the care you received while you were in the hospital after you are discharged, you can call the unit and asked to speak with the hospitalist on call if the hospitalist that took care of you is not available. Once you are discharged, your primary care physician will handle any further medical issues. Please note that NO REFILLS for any discharge medications will be authorized once you are discharged, as it is imperative that you return to your primary care physician (or establish a relationship with a primary care physician if you do not have one) for your aftercare needs so that they can reassess your need for medications and monitor your lab values.

## 2015-07-01 NOTE — Progress Notes (Signed)
Patient discharged to [facility], reported to nurse Chyrl Civatte.

## 2015-07-03 LAB — CULTURE, BLOOD (ROUTINE X 2)
Culture: NO GROWTH
Culture: NO GROWTH

## 2015-07-03 LAB — AEROBIC/ANAEROBIC CULTURE (SURGICAL/DEEP WOUND)

## 2015-07-03 LAB — AEROBIC/ANAEROBIC CULTURE W GRAM STAIN (SURGICAL/DEEP WOUND)

## 2015-07-05 ENCOUNTER — Ambulatory Visit (INDEPENDENT_AMBULATORY_CARE_PROVIDER_SITE_OTHER): Payer: Medicare Other | Admitting: Podiatry

## 2015-07-05 ENCOUNTER — Encounter: Payer: Self-pay | Admitting: Podiatry

## 2015-07-05 DIAGNOSIS — Z09 Encounter for follow-up examination after completed treatment for conditions other than malignant neoplasm: Secondary | ICD-10-CM

## 2015-07-05 DIAGNOSIS — M86172 Other acute osteomyelitis, left ankle and foot: Secondary | ICD-10-CM

## 2015-07-05 MED ORDER — DOXYCYCLINE HYCLATE 100 MG PO TABS: 100.0000 mg | ORAL_TABLET | Freq: Two times a day (BID) | ORAL | Status: DC

## 2015-07-10 ENCOUNTER — Encounter: Payer: Self-pay | Admitting: Podiatry

## 2015-07-10 NOTE — Progress Notes (Signed)
Patient ID: SHELTON SQUARE, male   DOB: [birth year], 82 y.o.   MRN: [id number]  Subjective: John [name] is a 82 y.o. is seen today in office s/p left 2nd partial ray amputaiton preformed on 06/28/15. He presents today with his wife and son who states that he is doing well. He has not had any pain of the realize. He has remained in the surgical shoe. Denies any systemic complaints such as fevers, chills, nausea, vomiting. No calf pain, chest pain, shortness of breath.   Objective: General: No acute distress, AAOx3  DP/PT pulses palpable, CRT < 3 sec to all digits.   Motor function intact to remaining digits.  Left foot: Incision is well coapted without any evidence of dehiscence and sutures intact. There is no surrounding erythema, ascending cellulitis, fluctuance, crepitus, malodor, drainage/purulence. There is minimal edema around the surgical site. There is no pain along the surgical site. Chronic erythema to the left leg. There is no increase in warmth. No drainage or pus. No fluctuance or crepitus. No other areas of tenderness to bilateral lower extremities.  No other open lesions or pre-ulcerative lesions.  No pain with calf compression, swelling, warmth, erythema.   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 -At this time he does show positive culture in the clean margin showing rare bacteria. Because this will start doxycycline. This is ordered today. -Antibiotic ointment was applied followed by dressing. Facility can change the bandage with similar dressing. -I'll see him back as scheduled. That time possible suture removal. Even if the sutures are removed, remain in surgical shoe until I see him again.  -Monitor for any clinical signs or symptoms of infection and directed to call the office  immediately should any occur or go to the ER.  Ovid Curd, DPM

## 2015-07-12 ENCOUNTER — Ambulatory Visit (INDEPENDENT_AMBULATORY_CARE_PROVIDER_SITE_OTHER): Payer: Medicare Other | Admitting: Podiatry

## 2015-07-12 VITALS — BP 145/76 | HR 70 | Temp 97.2°F | Resp 16

## 2015-07-12 DIAGNOSIS — M86172 Other acute osteomyelitis, left ankle and foot: Secondary | ICD-10-CM

## 2015-07-12 DIAGNOSIS — Z09 Encounter for follow-up examination after completed treatment for conditions other than malignant neoplasm: Secondary | ICD-10-CM

## 2015-07-12 NOTE — Progress Notes (Signed)
Subjective:     Patient ID: John [name], male   DOB: [birth year], 82 y.o.   MRN: [id number]  HPI patient states and doing well with this left toe and it seems to be healing   Review of Systems     Objective:   Physical Exam Neurovascular status unchanged negative Homans sign noted with well-healing surgical site from amputation second digit left with wound edges well coapted stitches in place and no drainage    Assessment:     Doing well post amputation second digit left foot    Plan:     Discussed condition and at this point removed every other stitch. Went ahead and reapplied sterile dressing and he will return 2 weeks for final stitch removal and is encouraged to call if any other issues should occur or go to the emergency room if any systemic changes should occur

## 2015-07-26 ENCOUNTER — Encounter: Payer: Medicare Other | Admitting: Podiatry

## 2015-07-30 ENCOUNTER — Encounter: Payer: Medicare Other | Admitting: Podiatry

## 2015-07-30 ENCOUNTER — Encounter: Payer: Self-pay | Admitting: Podiatry

## 2015-07-30 ENCOUNTER — Ambulatory Visit (INDEPENDENT_AMBULATORY_CARE_PROVIDER_SITE_OTHER): Payer: Medicare Other | Admitting: Podiatry

## 2015-07-30 ENCOUNTER — Ambulatory Visit (INDEPENDENT_AMBULATORY_CARE_PROVIDER_SITE_OTHER): Payer: Medicare Other

## 2015-07-30 DIAGNOSIS — M79672 Pain in left foot: Secondary | ICD-10-CM

## 2015-07-30 DIAGNOSIS — Z09 Encounter for follow-up examination after completed treatment for conditions other than malignant neoplasm: Secondary | ICD-10-CM

## 2015-07-30 DIAGNOSIS — M86172 Other acute osteomyelitis, left ankle and foot: Secondary | ICD-10-CM

## 2015-08-01 NOTE — Progress Notes (Signed)
Patient ID: John [name], male   DOB: 12-Sep-1922, 82 y.o.   MRN: [id number]  Subjective: John [name] is a 82 y.o. is seen today in office s/p left 2nd partial ray amputaiton preformed on 06/28/15. He presents today for suture removal. He presents to the son states he is doing well. They deny any problems in the incision or any redness or any drainage. He is continued surgical shoe. Denies any systemic complaints such as fevers, chills, nausea, vomiting. No calf pain, chest pain, shortness of breath.   Objective: General: No acute distress, AAOx3  DP/PT pulses palpable, CRT < 3 sec to all digits.   Motor function intact to remaining digits.  Left foot: Incision is well coapted without any evidence of dehiscence and sutures intact. There is no surrounding erythema, ascending cellulitis, fluctuance, crepitus, malodor, drainage/purulence. There is ttsvr edema around the surgical site. There is no pain along the surgical site. Chronic erythema to the left leg but actually improved today. There is no increase in warmth. No drainage or pus. No fluctuance or crepitus. No other areas of tenderness to bilateral lower extremities.  No other open lesions or pre-ulcerative lesions.  No pain with calf compression, swelling, warmth, erythema.   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 No definitive evidence of acute of the myelitis this time. No evidence of fracture at this time. -Sutures removed today without complications or bleeding. Continue antibiotic when a dressing changes. Continue a surgical shoe. Continue elevation. -Continue doxycycline for 2 more weeks. -Monitor for signs or symptoms of worsening infection and directed to go the ER should any occur. -Follow-up in 3 weeks or sooner if needed. Call if questions concerns in the  meantime.  John [name], DPM

## 2015-08-02 ENCOUNTER — Encounter: Payer: Medicare Other | Admitting: Podiatry

## 2015-08-09 NOTE — Telephone Encounter (Signed)
That is OK

## 2015-08-12 ENCOUNTER — Telehealth: Payer: Self-pay | Admitting: *Deleted

## 2015-08-12 DIAGNOSIS — L03119 Cellulitis of unspecified part of limb: Principal | ICD-10-CM

## 2015-08-12 DIAGNOSIS — L02619 Cutaneous abscess of unspecified foot: Secondary | ICD-10-CM

## 2015-08-12 NOTE — Telephone Encounter (Addendum)
Nurse from [facility] called states pt is in office with red, hot weepy leg and left 2nd toe area is draining yellow, doctor there is going to give gram Rocephin and refill the Doxycycline. Appt at [facility] 08/13/2015 in the morning. 08/13/2015-[facility] staff - [facility] states pt says he was to get an ointment with the Clindamycin.  Dr. Ardelle Anton states pt is to get Iodosorb or Iodoflex from [facility].  I informed the [facility] - pharmacist, and she states she will call pt. Faxed orders to [facility]. 08/30/2015-DrArdelle Anton referred to Infectious disease and venous doppler left leg. I informed pt's wife, Cordelia Pen the cipro had been called to the pharmacy, to take cipro and cleocin at different times and stop the doxycycline.  I told Cordelia Pen she would be receiving an appt call from Infectious Disease at [facility], and vascular studies from [facility]. Doppler orders to [facility]. Faxed required referral form, pt clinical and demographics to Infectious Disease. [facility] - [facility] states there is an adverse reaction between Amiodarone and Cipro. Dr. Ardelle Anton changed to Augmentin. Orders called to [facility].

## 2015-08-12 NOTE — Telephone Encounter (Signed)
OK if it gets worse go to the ER.

## 2015-08-13 ENCOUNTER — Encounter: Payer: Self-pay | Admitting: Podiatry

## 2015-08-13 ENCOUNTER — Ambulatory Visit (INDEPENDENT_AMBULATORY_CARE_PROVIDER_SITE_OTHER): Payer: Medicare Other | Admitting: Podiatry

## 2015-08-13 ENCOUNTER — Ambulatory Visit (INDEPENDENT_AMBULATORY_CARE_PROVIDER_SITE_OTHER): Payer: Medicare Other

## 2015-08-13 VITALS — BP 120/62 | HR 70 | Temp 97.3°F | Resp 18

## 2015-08-13 DIAGNOSIS — T8781 Dehiscence of amputation stump: Secondary | ICD-10-CM

## 2015-08-13 DIAGNOSIS — M79672 Pain in left foot: Secondary | ICD-10-CM

## 2015-08-13 DIAGNOSIS — M86679 Other chronic osteomyelitis, unspecified ankle and foot: Secondary | ICD-10-CM

## 2015-08-13 DIAGNOSIS — Z09 Encounter for follow-up examination after completed treatment for conditions other than malignant neoplasm: Secondary | ICD-10-CM

## 2015-08-13 DIAGNOSIS — L97929 Non-pressure chronic ulcer of unspecified part of left lower leg with unspecified severity: Secondary | ICD-10-CM

## 2015-08-13 DIAGNOSIS — L03116 Cellulitis of left lower limb: Secondary | ICD-10-CM

## 2015-08-13 MED ORDER — NONFORMULARY OR COMPOUNDED ITEM: 3 refills | Status: DC

## 2015-08-13 MED ORDER — CLINDAMYCIN HCL 300 MG PO CAPS: 300.0000 mg | ORAL_CAPSULE | Freq: Three times a day (TID) | ORAL | 2 refills | Status: DC

## 2015-08-20 NOTE — Progress Notes (Signed)
Subjective: 82 year old male presents the office they with his wife and son for concerns of infection to the left foot. He was seen by his primary care physician yesterday and he was given an injection of Rocephin as well as refill doxycycline. The leg is red and he has had some small amount of drainage coming from the indications site. They deny any systemic complaints such as fevers, chills, nausea, vomiting. No acute changes since last appointment, and no other complaints at this time.   Objective: NAD DP/PT pulses palpable bilaterally, CRT less than 3 seconds Faint erythema on the dorsal aspect of the patient's left foot. Along the incision site is a small central area of wound dehiscence and opening which does not probe to bone there is no undermining or tunneling. This measures about 0.2 x 0.2 cm. Small amount of serous and was transitioned expressed there is no pus. There is chronic venous changes of the left leg and some slight erythema. This erythematous to leg is been ongoing since I'm very first saw him and actually appears to be somewhat improved today compared to what it was initially. There is no weeping of the legs there is no fluctuance or crepitus. There is no malodor.. No edema, erythema, increase in warmth to bilateral lower extremities.  No open lesions or pre-ulcerative lesions.  No pain with calf compression, swelling, warmth, erythema  Assessment: Left leg/foot infection, cellulitis  Plan: -All treatment options discussed with the patient including all alternatives, risks, complications.  -X-rays obtained and reviewed. -Wound was debrided on the amputation site. Recommended daily dressing changes. Wound dressings were ordered today. Ordered Iodosorb from present. Continue daily dressing changes. -Continue doxycycline. We'll also add clindamycin. -Monitor for signs or symptoms of worsening infection To the ER should any signs or symptoms of worsening infection occur. Also  encouraged to call any questions or concerns or any change in symptoms the office. Follow up in 1 week or sooner if needed.  Ovid Curd, DPM

## 2015-08-23 ENCOUNTER — Ambulatory Visit (INDEPENDENT_AMBULATORY_CARE_PROVIDER_SITE_OTHER): Payer: Medicare Other | Admitting: Podiatry

## 2015-08-23 ENCOUNTER — Encounter: Payer: Self-pay | Admitting: Podiatry

## 2015-08-23 VITALS — BP 166/91 | HR 70 | Resp 16

## 2015-08-23 DIAGNOSIS — L03116 Cellulitis of left lower limb: Secondary | ICD-10-CM

## 2015-08-23 DIAGNOSIS — L97929 Non-pressure chronic ulcer of unspecified part of left lower leg with unspecified severity: Secondary | ICD-10-CM

## 2015-08-23 MED ORDER — DOXYCYCLINE HYCLATE 100 MG PO TABS: 100.0000 mg | ORAL_TABLET | Freq: Two times a day (BID) | ORAL | 0 refills | Status: DC

## 2015-08-23 MED ORDER — CLINDAMYCIN HCL 300 MG PO CAPS: 300.0000 mg | ORAL_CAPSULE | Freq: Three times a day (TID) | ORAL | 2 refills | Status: DC

## 2015-08-27 ENCOUNTER — Ambulatory Visit: Payer: Medicare Other | Admitting: Podiatry

## 2015-08-28 ENCOUNTER — Emergency Department (HOSPITAL_COMMUNITY)
Admission: EM | Admit: 2015-08-28 | Discharge: 2015-08-28 | Disposition: A | Discharge status: Home / Self Care | Payer: Medicare Other | Attending: Emergency Medicine | Admitting: Emergency Medicine

## 2015-08-28 ENCOUNTER — Emergency Department (HOSPITAL_COMMUNITY): Payer: Medicare Other

## 2015-08-28 ENCOUNTER — Encounter (HOSPITAL_COMMUNITY): Payer: Self-pay | Admitting: Emergency Medicine

## 2015-08-28 DIAGNOSIS — F0390 Unspecified dementia without behavioral disturbance: Secondary | ICD-10-CM | POA: Diagnosis not present

## 2015-08-28 DIAGNOSIS — I2510 Atherosclerotic heart disease of native coronary artery without angina pectoris: Secondary | ICD-10-CM | POA: Insufficient documentation

## 2015-08-28 DIAGNOSIS — Y92009 Unspecified place in unspecified non-institutional (private) residence as the place of occurrence of the external cause: Secondary | ICD-10-CM | POA: Insufficient documentation

## 2015-08-28 DIAGNOSIS — I509 Heart failure, unspecified: Secondary | ICD-10-CM | POA: Insufficient documentation

## 2015-08-28 DIAGNOSIS — L03116 Cellulitis of left lower limb: Secondary | ICD-10-CM | POA: Insufficient documentation

## 2015-08-28 DIAGNOSIS — Z7982 Long term (current) use of aspirin: Secondary | ICD-10-CM | POA: Insufficient documentation

## 2015-08-28 DIAGNOSIS — Z791 Long term (current) use of non-steroidal anti-inflammatories (NSAID): Secondary | ICD-10-CM | POA: Insufficient documentation

## 2015-08-28 DIAGNOSIS — Z95810 Presence of automatic (implantable) cardiac defibrillator: Secondary | ICD-10-CM | POA: Insufficient documentation

## 2015-08-28 DIAGNOSIS — Y939 Activity, unspecified: Secondary | ICD-10-CM | POA: Diagnosis not present

## 2015-08-28 DIAGNOSIS — W19XXXA Unspecified fall, initial encounter: Secondary | ICD-10-CM

## 2015-08-28 DIAGNOSIS — I110 Hypertensive heart disease with heart failure: Secondary | ICD-10-CM | POA: Insufficient documentation

## 2015-08-28 DIAGNOSIS — Y999 Unspecified external cause status: Secondary | ICD-10-CM | POA: Insufficient documentation

## 2015-08-28 DIAGNOSIS — Z951 Presence of aortocoronary bypass graft: Secondary | ICD-10-CM | POA: Insufficient documentation

## 2015-08-28 DIAGNOSIS — E119 Type 2 diabetes mellitus without complications: Secondary | ICD-10-CM | POA: Insufficient documentation

## 2015-08-28 DIAGNOSIS — R200 Anesthesia of skin: Secondary | ICD-10-CM | POA: Diagnosis present

## 2015-08-28 DIAGNOSIS — W07XXXA Fall from chair, initial encounter: Secondary | ICD-10-CM | POA: Insufficient documentation

## 2015-08-28 MED ORDER — IOPAMIDOL (ISOVUE-300) INJECTION 61%: 100.0000 mL | Freq: Once | INTRAVENOUS | Status: DC | PRN

## 2015-08-28 NOTE — ED Notes (Signed)
Bed: [facility] Expected date: 08/28/15 Expected time: 5:06 PM Means of arrival:  Comments: Fall, no injury

## 2015-08-28 NOTE — ED Triage Notes (Signed)
Per [facility] pt had fall today at [facility], landed on buttocks. Hx dementia.  No obvious distress, no injury or complaints at this time per [facility] . No head injury per bystanders.

## 2015-08-28 NOTE — ED Provider Notes (Addendum)
[facility]-EMERGENCY DEPT Provider Note   CSN: [id number] Arrival date & time: 08/28/15  1718     History   Chief Complaint Chief Complaint  Patient presents with  . Fall    HPI John [name] is a 82 y.o. male.  HPI  Patient presented after mechanical fall while at home. Family reports that the patient has dementia and was reportedly sitting in a chair in the hallway for about 4 hours before getting up. Shortly after numbness in his legs and Lost his balance. The wife did try to assist the patient and hold him up however they both fell. There is no loss of consciousness. Patient at baseline with dementia without acute change. They report that patient has been moving all his extremities without competition. Level V caveat due to the patient's dementia.    Past Medical History:  Diagnosis Date  . AICD (automatic cardioverter/defibrillator) present   . Arthritis   . Atrial fibrillation (HCC)    Permanent  . BPH (benign prostatic hyperplasia)   . CAD (coronary artery disease)    s/p CABG; Failed LAD PCI 2010  . Carotid artery occlusion   . CHF (congestive heart failure) (HCC)   . Decreased hearing   . Dementia   . DM2 (diabetes mellitus, type 2) (HCC)   . Dysrhythmia    hx atrial fib ,tachycardia  . Gilbert syndrome   . Headache   . HLD (hyperlipidemia)   . HTN (hypertension)   . Memory loss   . Presence of permanent cardiac pacemaker   . Stroke [facility](HCC) ?10  . Subdural hematoma (HCC) 11/05   Precluding Coumadin  . Ventricular tachycardia (HCC)    Syncope-2010 treated with amiodarone    Patient Active Problem List   Diagnosis Date Noted  . Cellulitis 06/28/2015  . CHF (congestive heart failure) (HCC)   . Diabetes mellitus with complication (HCC)   . Nonhealing ulcer of left lower extremity (HCC) 06/17/2015  . Acute osteomyelitis of ankle and foot (HCC) 06/09/2015  . Atherosclerosis of artery of extremity with ulceration (HCC) 01/21/2014  . Vascular dementia,  uncomplicated 09/18/2012  . Unspecified late effects of cerebrovascular disease 09/18/2012  . Edema 04/05/2010  . [employer]   [employer] 04/05/2010  . Ventricular tachycardia (HCC)   . Atrial fibrillation (HCC)   . CAD (coronary artery disease)   . Diabetes (HCC) 01/12/2009  . GILBERT'S SYNDROME 01/12/2009  . Essential hypertension 01/12/2009  . CARDIOMYOPATHY, ISCHEMIC 01/12/2009  . VENTRICULAR TACHYCARDIA 01/12/2009  . SICK SINUS/ TACHY-BRADY SYNDROME 01/12/2009    Past Surgical History:  Procedure Laterality Date  . AMPUTATION Left 06/28/2015   Procedure: AMPUTATION SECOND LEFT TOE;  Surgeon: Vivi BarrackMatthew R Wagoner, DPM;  Location: [facility] OR;  Service: Podiatry;  Laterality: Left;  . BRAIN SURGERY  05   subdural hematoma  . CORONARY ARTERY BYPASS GRAFT  1998  . HEMORRHOID SURGERY    . PACEMAKER INSERTION  ?10   [employer]  . PERIPHERAL VASCULAR CATHETERIZATION N/A 05/17/2015   Procedure: Abdominal Aortogram w/Lower Extremity;  Surgeon: Chuck Hinthristopher S Dickson, MD;  [facility];  Service: Cardiovascular;  Laterality: N/A;  . TOE AMPUTATION Left 06/28/2015   2nd toe       Home Medications    Prior to Admission medications   Medication Sig Start Date End Date Taking? Authorizing Provider  acetaminophen (TYLENOL) 325 MG tablet Take 2 tablets (650 mg total) by mouth every 6 (six) hours as needed for mild pain (or Fever >/=  101). 07/01/15  Yes Dawood S Elgergawy, MD  acidophilus (RISAQUAD) CAPS capsule Take 2 capsules by mouth daily. 07/01/15  Yes Starleen Arms, MD  amiodarone (PACERONE) 200 MG tablet Take 200 mg by mouth See admin instructions. Pt only takes on Sunday, Monday, Wednesday, Thursday, and Friday.   Yes Historical Provider, MD  aspirin EC 81 MG tablet Take 81 mg by mouth at bedtime.   Yes Historical Provider, MD  carvedilol (COREG) 6.25 MG tablet Take 1 tablet (6.25 mg total) by mouth 2 (two) times daily with a meal. 03/03/15  Yes Duke Salvia, MD  cholecalciferol (VITAMIN D) 1000 units tablet Take 1,000 Units by mouth daily.   Yes Historical Provider, MD  clindamycin (CLEOCIN) 300 MG capsule Take 1 capsule (300 mg total) by mouth 3 (three) times daily. 08/23/15  Yes Vivi Barrack, DPM  doxycycline (VIBRA-TABS) 100 MG tablet Take 1 tablet (100 mg total) by mouth 2 (two) times daily. 08/23/15  Yes Kirstie Peri Regal, DPM  escitalopram (LEXAPRO) 10 MG tablet Take 10 mg by mouth daily.   Yes Historical Provider, MD  Multiple Vitamins-Minerals (PRESERVISION AREDS 2) CAPS Take 1 capsule by mouth 2 (two) times daily.   Yes Historical Provider, MD  NAMENDA XR 28 MG CP24 24 hr capsule Take 28 mg by mouth daily.    Yes Historical Provider, MD  pravastatin (PRAVACHOL) 40 MG tablet Take 40 mg by mouth at bedtime.    Yes Historical Provider, MD  rivastigmine (EXELON) 9.5 mg/24hr Place 1 patch onto the skin at bedtime.    Yes Historical Provider, MD  HYDROcodone-acetaminophen (NORCO/VICODIN) 5-325 MG tablet Take 1 tablet by mouth every 6 (six) hours as needed for moderate pain. Patient not taking: Reported on 08/28/2015 07/01/15   Leana Roe Elgergawy, MD  insulin aspart (NOVOLOG) 100 UNIT/ML injection Inject 0-15 Units into the skin 3 (three) times daily with meals. Patient not taking: Reported on 08/28/2015 07/01/15   Starleen Arms, MD    Family History Family History  Problem Relation Age of Onset  . Coronary artery disease Mother   . Cancer Father     Social History Social History  Substance Use Topics  . Smoking status: Never Smoker  . Smokeless tobacco: Never Used  . Alcohol use No     Allergies   Review of patient's allergies indicates no known allergies.   Review of Systems Review of Systems  Unable to perform ROS: Dementia  Skin:       Cellulitis of the left lower extremity which is being followed [facility]. Patient was recently placed on a new antibiotic on August 14. Patient has close follow-up with his primary care  doctor in 2 days for reevaluation.     Physical Exam Updated Vital Signs BP 137/57 (BP Location: Left Arm)   Pulse 70   Temp 97.5 F (36.4 C) (Oral)   Resp 20   SpO2 98%   Physical Exam  Constitutional: He is oriented to person, place, and time. He appears well-developed and well-nourished. No distress.  HENT:  Head: Normocephalic.  Right Ear: External ear normal.  Left Ear: External ear normal.  Mouth/Throat: Oropharynx is clear and moist.  Eyes: Conjunctivae and EOM are normal. Pupils are equal, round, and reactive to light. Right eye exhibits no discharge. Left eye exhibits no discharge. No scleral icterus.  Neck: Normal range of motion. Neck supple.  Cardiovascular: Regular rhythm and normal heart sounds.  Exam reveals no gallop and no friction  rub.   No murmur heard. Pulses:      Radial pulses are 2+ on the right side, and 2+ on the left side.       Dorsalis pedis pulses are 2+ on the right side, and 2+ on the left side.  Pulmonary/Chest: Effort normal and breath sounds normal. No stridor. No respiratory distress.  Abdominal: Soft. He exhibits no distension. There is no tenderness.  Musculoskeletal:       Cervical back: He exhibits no bony tenderness.       Thoracic back: He exhibits no bony tenderness.       Lumbar back: He exhibits no bony tenderness.  Clavicle stable. Chest stable to AP/Lat compression Pelvis stable to Lat compression No obvious extremity deformity  Neurological: He is alert and oriented to person, place, and time. GCS eye subscore is 4. GCS verbal subscore is 5. GCS motor subscore is 6.  Moving all extremities   Skin: Skin is warm. He is not diaphoretic. There is erythema (left lower extremity).        ED Treatments / Results  Labs (all labs ordered are listed, but only abnormal results are displayed) Labs Reviewed - No data to display  EKG  EKG Interpretation None       Radiology Dg Lumbar Spine Complete  Result Date:  08/28/2015 CLINICAL DATA:  Status post fall, landing on buttocks. Concern for lower back injury. Initial encounter. EXAM: LUMBAR SPINE - COMPLETE 4+ VIEW COMPARISON:  None. FINDINGS: There is no evidence of fracture or subluxation. Vertebral bodies demonstrate normal height and alignment. Prominent anterior and lateral osteophytes are noted along the lumbar spine, with multilevel vacuum phenomenon and disc space narrowing. Underlying facet disease is noted. The visualized bowel gas pattern is unremarkable in appearance; air and stool are noted within the colon. The sacroiliac joints are within normal limits. Scattered vascular calcifications are seen. IMPRESSION: 1. No evidence of fracture or subluxation along the lumbar spine. 2. Degenerative change along the lumbar spine. 3. Scattered vascular calcifications seen. Electronically Signed   By: Roanna Raider M.D.   On: 08/28/2015 19:26   Ct Head Wo Contrast  Result Date: 08/28/2015 CLINICAL DATA:  Fall EXAM: CT HEAD WITHOUT CONTRAST CT CERVICAL SPINE WITHOUT CONTRAST TECHNIQUE: Multidetector CT imaging of the head and cervical spine was performed following the standard protocol without intravenous contrast. Multiplanar CT image reconstructions of the cervical spine were also generated. COMPARISON:  09/20/2013 FINDINGS: CT HEAD FINDINGS No skull fracture is noted. Again noted prior left craniotomy. Burr hole in right frontal region again noted. Stable chronic encephalomalacia in right occipital, frontal and parietal lobe. Stable cerebral atrophy. No definite acute cortical infarction. No intracranial hemorrhage, mass effect or midline shift. No mass lesion is noted on this unenhanced scan. Atherosclerotic calcifications of vertebral arteries. No definite acute cortical infarction. CT CERVICAL SPINE FINDINGS Axial images of the cervical spine shows no acute fracture. Computer processed images shows no acute fracture. There is about 4 mm anterolisthesis C4 on C5  vertebral body. Extensive degenerative changes C1-C2 articulation. Disc space flattening with vacuum disc phenomenon and mild anterior spurring at C4-C5 level. Significant disc space flattening with mild anterior and mild posterior spurring and endplate sclerotic changes at C5-C6 and C6-C7 level. No prevertebral soft tissue swelling. Cystic degenerative changes are noted C2 odontoid. No prevertebral soft tissue swelling. Cervical airway is patent. Multilevel facet degenerative changes are noted. There is no pneumothorax in visualized lung apices. Mild emphysematous changes. Small bilateral pleural effusion.  Atherosclerotic calcifications are noted bilateral carotid bifurcation. IMPRESSION: 1. No acute intracranial abnormality. Stable postsurgical changes with prior left craniotomy. Again noted chronic encephalomalacia in right occipital, frontal and parietal lobe. Stable atrophy and chronic white matter disease. No definite acute cortical infarction. 2. No cervical spine acute fracture. There is about 4 mm anterolisthesis C4 on C5 vertebral body. Multilevel degenerative changes as described above. No prevertebral soft tissue swelling. Electronically Signed   By: Natasha Mead M.D.   On: 08/28/2015 19:44   Ct Cervical Spine Wo Contrast  Result Date: 08/28/2015 CLINICAL DATA:  Fall EXAM: CT HEAD WITHOUT CONTRAST CT CERVICAL SPINE WITHOUT CONTRAST TECHNIQUE: Multidetector CT imaging of the head and cervical spine was performed following the standard protocol without intravenous contrast. Multiplanar CT image reconstructions of the cervical spine were also generated. COMPARISON:  09/20/2013 FINDINGS: CT HEAD FINDINGS No skull fracture is noted. Again noted prior left craniotomy. Burr hole in right frontal region again noted. Stable chronic encephalomalacia in right occipital, frontal and parietal lobe. Stable cerebral atrophy. No definite acute cortical infarction. No intracranial hemorrhage, mass effect or midline  shift. No mass lesion is noted on this unenhanced scan. Atherosclerotic calcifications of vertebral arteries. No definite acute cortical infarction. CT CERVICAL SPINE FINDINGS Axial images of the cervical spine shows no acute fracture. Computer processed images shows no acute fracture. There is about 4 mm anterolisthesis C4 on C5 vertebral body. Extensive degenerative changes C1-C2 articulation. Disc space flattening with vacuum disc phenomenon and mild anterior spurring at C4-C5 level. Significant disc space flattening with mild anterior and mild posterior spurring and endplate sclerotic changes at C5-C6 and C6-C7 level. No prevertebral soft tissue swelling. Cystic degenerative changes are noted C2 odontoid. No prevertebral soft tissue swelling. Cervical airway is patent. Multilevel facet degenerative changes are noted. There is no pneumothorax in visualized lung apices. Mild emphysematous changes. Small bilateral pleural effusion. Atherosclerotic calcifications are noted bilateral carotid bifurcation. IMPRESSION: 1. No acute intracranial abnormality. Stable postsurgical changes with prior left craniotomy. Again noted chronic encephalomalacia in right occipital, frontal and parietal lobe. Stable atrophy and chronic white matter disease. No definite acute cortical infarction. 2. No cervical spine acute fracture. There is about 4 mm anterolisthesis C4 on C5 vertebral body. Multilevel degenerative changes as described above. No prevertebral soft tissue swelling. Electronically Signed   By: Natasha Mead M.D.   On: 08/28/2015 19:44   Dg Hip Unilat W Or Wo Pelvis 2-3 Views Right  Result Date: 08/28/2015 CLINICAL DATA:  Status post fall, landing on buttocks, with right hip pain. Initial encounter. EXAM: DG HIP (WITH OR WITHOUT PELVIS) 2-3V RIGHT COMPARISON:  None. FINDINGS: There is no evidence of fracture or dislocation. Both femoral heads are seated normally within their respective acetabula. The proximal right femur  appears intact. Mild degenerative change is noted at the lower lumbar spine. The sacroiliac joints are grossly unremarkable. The visualized bowel gas pattern is grossly unremarkable in appearance. Scattered vascular calcifications are seen. IMPRESSION: 1. No evidence of fracture or dislocation. 2. Scattered vascular calcifications seen. Electronically Signed   By: Roanna Raider M.D.   On: 08/28/2015 19:25    Procedures Procedures (including critical care time)  Medications Ordered in ED Medications  iopamidol (ISOVUE-300) 61 % injection 100 mL (not administered)     Initial Impression / Assessment and Plan / ED Course  I have reviewed the triage vital signs and the nursing notes.  Pertinent labs & imaging results that were available during my care of the  patient were reviewed by me and considered in my medical decision making (see chart for details).  Clinical Course    Witnessed mechanical fall at home. No LOC. Patient at baseline is demented. CT head neg. CT cervical spine neg. Plain film of the right hip w/o fx. Plain film of the lumbar spine w/o fx.  Primary care provider is already managing patient's cellulitis who was recently placed on a second course of antibiotics several days ago and has close follow-up with primary care provider in a few days. We'll defer continued management to the primary care provider.  Patient safe for discharge with strict return precautions.  Final Clinical Impressions(s) / ED Diagnoses   Final diagnoses:  Fall, initial encounter  Cellulitis of left lower extremity  Dementia, without behavioral disturbance    Disposition: Discharge  Condition: Good  I have discussed the results, Dx and Tx plan with the patient's family who expressed understanding and agree(s) with the plan. Discharge instructions discussed at great length. The patient's family was given strict return precautions who verbalized understanding of the instructions. No further  questions at time of discharge.    Current Discharge Medication List      Follow Up: Rodrigo Ran, MD [address] [place] [address] [phone]   keep your scheduled appointment for continued management of your leg infection        Nira Conn, MD 08/28/15 2025

## 2015-08-29 NOTE — Progress Notes (Signed)
Subjective: 82 year old male presents the office they with his wife and son for follow-up evaluation of infection to the left foot. He is completed his course of antibiotics. They deny any drainage or pus coming from the wound and the redness does continue to left leg. Denies any drainage to the leg today. Denies any new open sores present.They deny any systemic complaints such as fevers, chills, nausea, vomiting. No acute changes since last appointment, and no other complaints at this time.   Objective: NAD DP/PT pulses palpable bilaterally, CRT less than 3 seconds Small wound present on the incision site this from the amputation which is more superficial today with a granular wound base. There is no probing, undermining or tunneling. There is no swelling erythema, ascending cellulitis around this area. There is no sign of skin erythema to the dorsal aspect of the foot however erythematous continue to the leg. No drainage or weeping present. No open lesions at this time to the leg. No pain with calf compression, swelling, warmth, erythema. Erythema to his leg is been ongoing since I first met him as exited somewhat improved compared to what it was. There also appears to be chronic venous changes the left leg. No edema, erythema, increase in warmth to bilateral lower extremities.  No open lesions or pre-ulcerative lesions.  No pain with calf compression, swelling, warmth, erythema  Assessment: Left leg/foot infection, cellulitis  Plan: -All treatment options discussed with the patient including all alternatives, risks, complications.  -Continue daily dressing changes at the wound to left foot. Continue antibiotics. Compression wrap to help with venous changes to the left leg. I believe that a lot of his erythema is due to venous insufficiency as a postal cellulitis. Is no warmth today with the leg or to the foot. -We'll consider venous ultrasound/reflux study if symptoms continue. -Monitor for  signs or symptoms of worsening infection go to the ER should any signs or symptoms of worsening infection occur. Also encouraged to call any questions or concerns or any change in symptoms the office. Follow up as scheduled or sooner if needed.  Celesta Gentile, DPM

## 2015-08-30 ENCOUNTER — Ambulatory Visit (INDEPENDENT_AMBULATORY_CARE_PROVIDER_SITE_OTHER): Payer: Medicare Other | Admitting: Podiatry

## 2015-08-30 ENCOUNTER — Encounter: Payer: Self-pay | Admitting: Podiatry

## 2015-08-30 DIAGNOSIS — L97521 Non-pressure chronic ulcer of other part of left foot limited to breakdown of skin: Secondary | ICD-10-CM

## 2015-08-30 DIAGNOSIS — L03116 Cellulitis of left lower limb: Secondary | ICD-10-CM

## 2015-08-30 MED ORDER — CIPROFLOXACIN HCL 500 MG PO TABS: 500.0000 mg | ORAL_TABLET | Freq: Two times a day (BID) | ORAL | 2 refills | Status: DC

## 2015-08-30 MED ORDER — AMOXICILLIN-POT CLAVULANATE 875-125 MG PO TABS: 1.0000 | ORAL_TABLET | Freq: Two times a day (BID) | ORAL | 0 refills | Status: DC

## 2015-08-30 NOTE — Patient Instructions (Signed)
Continue clindamycin and start ciprofloxacin. Stop doxycycline.

## 2015-08-30 NOTE — Progress Notes (Signed)
Subjective: 82 year old male presents the office they with his wife and son for follow-up evaluation of infection to the left foot. Able to the wounds the surgical site is healing well however the leg does continue the red and swollen as exited worsen since last appointment. He did have a fall since last appointment and he was seen in the emergency department for this and was discharged. He is continued on antibiotics.He is on doxycycline and clindamycin.They deny any systemic complaints such as fevers, chills, nausea, vomiting. No acute changes since last appointment, and no other complaints at this time.   Objective: NAD DP/PT pulses palpable bilaterally, CRT less than 3 seconds Small superficial granular ulceration present on the amputation site which is healing well. There is no probing, undermining or tunneling. There is no drainage or pus expressed today. There is no fluctuance or crepitus. There is no malodor. There is edema and erythema to the left leg which is exited worsen since last appointment. There is a minimal increase in warmth to the leg. There is no pain with calf compression. There is clear drainage from the leg and is weeping. No other open lesions or pre-ulcerative lesions.  Assessment: Flex cellulitis, healing wounds status post surgery  Plan: -All treatment options discussed with the patient including all alternatives, risks, complications.  -His left leg is been swollen or red since when I first met him before the surgery. It was actually improved last appointment is worsen again today. The surgical wound is healing well. The majority of edema erythema is localized to the leg. -Will continue clindamycin and start Augmentin as well. -If any symptoms are worsening is to go medially to the emergency room at this point. -Will order a venous duplex/reflux study -Infectious disease consult place today. -Elevation. -He has an appointment with his primary care physician on  Friday. -I'll see him back in 1 week or sooner if any issues are to arise. In the meantime I encouraged his family a call any questions, concerns or any change in symptoms.  Celesta Gentile, DPM

## 2015-09-01 ENCOUNTER — Ambulatory Visit (HOSPITAL_COMMUNITY)
Admission: RE | Admit: 2015-09-01 | Discharge: 2015-09-01 | Disposition: A | Discharge status: Home / Self Care | Payer: Medicare Other | Source: Ambulatory Visit | Attending: Vascular Surgery | Admitting: Vascular Surgery

## 2015-09-01 DIAGNOSIS — E785 Hyperlipidemia, unspecified: Secondary | ICD-10-CM | POA: Insufficient documentation

## 2015-09-01 DIAGNOSIS — L03116 Cellulitis of left lower limb: Secondary | ICD-10-CM | POA: Diagnosis not present

## 2015-09-01 DIAGNOSIS — I110 Hypertensive heart disease with heart failure: Secondary | ICD-10-CM | POA: Diagnosis not present

## 2015-09-01 DIAGNOSIS — M7989 Other specified soft tissue disorders: Secondary | ICD-10-CM | POA: Diagnosis present

## 2015-09-01 DIAGNOSIS — I509 Heart failure, unspecified: Secondary | ICD-10-CM | POA: Insufficient documentation

## 2015-09-01 DIAGNOSIS — E119 Type 2 diabetes mellitus without complications: Secondary | ICD-10-CM | POA: Diagnosis not present

## 2015-09-01 DIAGNOSIS — I2510 Atherosclerotic heart disease of native coronary artery without angina pectoris: Secondary | ICD-10-CM | POA: Insufficient documentation

## 2015-09-06 ENCOUNTER — Encounter: Payer: Self-pay | Admitting: Podiatry

## 2015-09-06 ENCOUNTER — Ambulatory Visit (INDEPENDENT_AMBULATORY_CARE_PROVIDER_SITE_OTHER): Payer: Medicare Other | Admitting: Podiatry

## 2015-09-06 DIAGNOSIS — L03116 Cellulitis of left lower limb: Secondary | ICD-10-CM | POA: Diagnosis not present

## 2015-09-06 MED ORDER — CLINDAMYCIN HCL 300 MG PO CAPS: 300.0000 mg | ORAL_CAPSULE | Freq: Three times a day (TID) | ORAL | 2 refills | Status: DC

## 2015-09-06 MED ORDER — AMOXICILLIN-POT CLAVULANATE 875-125 MG PO TABS: 1.0000 | ORAL_TABLET | Freq: Two times a day (BID) | ORAL | 0 refills | Status: DC

## 2015-09-07 NOTE — Progress Notes (Signed)
Subjective: 82 year old male presents the office they with his wife and son for follow-up evaluation of infection to the left foot. He has continue antibiotics. Patient leg and the foot is looking much better. They started using triamcinolone cream that was prescribed by the primary care physician and this is also been helping. Denies any drainage or weeping of the leg or foot. They deny any systemic complaints such as fevers, chills, nausea, vomiting. No acute changes since last appointment, and no other complaints at this time.   Objective: NAD DP/PT pulses palpable bilaterally, CRT less than 3 seconds Incision from the amputation site appears to be well-healed at this time. Small hyperkeratotic tissue is present on the incision upon debridement there is no underlying ulceration, drainage or other signs of infection. There is no edema to the area. There is faint erythema to the leg and the foot although this is significantly improved. The swelling has also improved. There is no pain with calf compression of the calf is supple. There is no areas of fluctuance or crepitance 30, malodor. No other open lesions or pre-ulcerative lesions.  Assessment: Resolving cellulitis left leg, foot  Plan: -All treatment options discussed with the patient including all alternatives, risks, complications.  -The foot and leg appear to be substantially improved compared to last opponent. Continue antibiotic this was refilled today. Continue triamcinolone cream on the leg. Monitor for signs or symptoms of worsening infection and directed to call the office immediately should any occur or go to the ER. Continue elevation. -Follow up in 2 weeks or sooner if needed. Call any questions or concerns in the meantime.  Ovid Curd, DPM

## 2015-09-20 ENCOUNTER — Ambulatory Visit (INDEPENDENT_AMBULATORY_CARE_PROVIDER_SITE_OTHER): Admitting: Internal Medicine

## 2015-09-20 ENCOUNTER — Ambulatory Visit (INDEPENDENT_AMBULATORY_CARE_PROVIDER_SITE_OTHER): Payer: Medicare Other | Admitting: Podiatry

## 2015-09-20 ENCOUNTER — Encounter: Payer: Self-pay | Admitting: Podiatry

## 2015-09-20 ENCOUNTER — Encounter: Payer: Self-pay | Admitting: Internal Medicine

## 2015-09-20 VITALS — BP 131/70 | HR 69 | Ht 71.0 in | Wt 236.0 lb

## 2015-09-20 VITALS — BP 125/72 | HR 70 | Ht 71.0 in | Wt 236.0 lb

## 2015-09-20 DIAGNOSIS — L03116 Cellulitis of left lower limb: Secondary | ICD-10-CM

## 2015-09-20 DIAGNOSIS — L0390 Cellulitis, unspecified: Secondary | ICD-10-CM

## 2015-09-20 NOTE — Progress Notes (Signed)
Subjective: 82 year old male presents the office they with his wife and son for follow-up evaluation of infection to the left foot and cellulitis to the left leg. He just finished the course of antibiotics. His son states that the redness and the swelling has completely resolved and he said no issues. They deny having any systemic complaints as fevers, chills, nausea, vomiting. He has not had any calf pain, chest pain, shortness of breath.  Objective: NAD DP/PT pulses palpable bilaterally, CRT less than 3 seconds Incision from the amputation site appears to be well-healed at this time. There is no erythema to the foot or to the leg. The site was appears to be resolved today. There is no open sore identified there is no drainage or weeping the leg. There is no pain with calf compression, swelling, warmth or any erythema. No other open lesions or pre-ulcer lesions within for this time.  Assessment: Resolved cellulitis left leg, foot  Plan: -All treatment options discussed with the patient including all alternatives, risks, complications.  -At this on the surgical site appears to be well-healed the cellulitis appears to be completely resolved. He just finished his course of intermaxillary monitor off of antibiotics. If at any time he starts to have any recurrence of any swelling or redness any warmth to his foot or leg to call the office immediately or go to the ER. Follow-up with me in 3 weeks for final check or sooner if any issues are to arise. In the meantime I encouraged to call any questions, concerns or any change in symptoms.  Ovid Curd, DPM

## 2015-09-20 NOTE — Progress Notes (Signed)
[facility]      Reason for Consult: recurrent cellulitis    Referring Physician: Dr. Ardelle Anton    Patient ID: John [name], male    DOB: 06-20-1922, 82 y.o.   MRN: [id number]  HPI:   Here for evaluation of cellulitis of the left leg.   He has underlying PVD and previously saw Dr. Edilia Bo of vascular surgery, last visit in April of this year.  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  Cellulitis really started since his surgery in June and has been on multiple courses of antibiotics, though just finished yesterday recent antibiotics.  Describes cellulitis as warmth and redness that is hot to touch, weeping skin with discharge.  Does not get fever or chills, does not complain about much regardless. Does have chronic skin changes noted today.   Previous record reviewed in chart.  ABIs, note from Dr. Edilia Bo.    Past Medical History:  Diagnosis Date  . AICD (automatic cardioverter/defibrillator) present   . Arthritis   . Atrial fibrillation (HCC)    Permanent  . BPH (benign prostatic hyperplasia)   . CAD (coronary artery disease)    s/p CABG; Failed LAD PCI 2010  . Carotid artery occlusion   . CHF (congestive heart failure) (HCC)   . Decreased hearing   . Dementia   . DM2 (diabetes mellitus, type 2) (HCC)   . Dysrhythmia    hx atrial fib ,tachycardia  . Gilbert syndrome   . Headache   . HLD (hyperlipidemia)   . HTN (hypertension)   . Memory loss   . Presence of permanent cardiac pacemaker   . Stroke [facility]) ?10  . Subdural hematoma (HCC) 11/05   Precluding Coumadin  . Ventricular tachycardia (HCC)    Syncope-2010 treated with amiodarone    Prior to Admission medications   Medication Sig Start Date End Date Taking? Authorizing Provider  acetaminophen (TYLENOL) 325 MG tablet Take 2 tablets (650 mg total) by mouth  every 6 (six) hours as needed for mild pain (or Fever >/= 101). 07/01/15   Leana Roe Elgergawy, MD  acidophilus (RISAQUAD) CAPS capsule Take 2 capsules by mouth daily. 07/01/15   Leana Roe Elgergawy, MD  amiodarone (PACERONE) 200 MG tablet Take 200 mg by mouth See admin instructions. Pt only takes on Sunday, Monday, Wednesday, Thursday, and Friday.    Historical Provider, MD  amoxicillin-clavulanate (AUGMENTIN) 875-125 MG tablet Take 1 tablet by mouth 2 (two) times daily. 08/30/15   Vivi Barrack, DPM  amoxicillin-clavulanate (AUGMENTIN) 875-125 MG tablet Take 1 tablet by mouth 2 (two) times daily. 09/06/15   Vivi Barrack, DPM  aspirin EC 81 MG tablet Take 81 mg by mouth at bedtime.    Historical Provider, MD  carvedilol (COREG) 6.25 MG tablet Take 1 tablet (6.25 mg total) by mouth 2 (two) times daily with a meal. 03/03/15   Duke Salvia, MD  cholecalciferol (VITAMIN D) 1000 units tablet Take 1,000 Units by mouth daily.    Historical Provider, MD  clindamycin (CLEOCIN) 300 MG capsule Take 1 capsule (300 mg total) by mouth 3 (three) times daily. 09/06/15   Vivi Barrack, DPM  doxycycline (VIBRA-TABS) 100 MG tablet Take 1 tablet (100 mg total) by mouth 2 (two) times daily. 08/23/15   Lenn Sink, DPM  escitalopram (LEXAPRO)  10 MG tablet Take 10 mg by mouth daily.    Historical Provider, MD  HYDROcodone-acetaminophen (NORCO/VICODIN) 5-325 MG tablet Take 1 tablet by mouth every 6 (six) hours as needed for moderate pain. Patient not taking: Reported on 08/28/2015 07/01/15   Leana Roe Elgergawy, MD  insulin aspart (NOVOLOG) 100 UNIT/ML injection Inject 0-15 Units into the skin 3 (three) times daily with meals. Patient not taking: Reported on 08/28/2015 07/01/15   Leana Roe Elgergawy, MD  Multiple Vitamins-Minerals (PRESERVISION AREDS 2) CAPS Take 1 capsule by mouth 2 (two) times daily.    Historical Provider, MD  NAMENDA XR 28 MG CP24 24 hr capsule Take 28 mg by mouth daily.     Historical Provider, MD    pravastatin (PRAVACHOL) 40 MG tablet Take 40 mg by mouth at bedtime.     Historical Provider, MD  rivastigmine (EXELON) 9.5 mg/24hr Place 1 patch onto the skin at bedtime.     Historical Provider, MD    No Known Allergies  Social History  Substance Use Topics  . Smoking status: Never Smoker  . Smokeless tobacco: Never Used  . Alcohol use No    Family History  Problem Relation Age of Onset  . Coronary artery disease Mother   . Cancer Father     Review of Systems  Constitutional: negative for fevers and chills Gastrointestinal: negative for nausea All other systems reviewed and are negative   Constitutional: in no apparent distress and alert There were no vitals filed for this visit. EYES: anicteric ENMT: Cardiovascular: Cor RRR Respiratory: CTA B; normal respiratory effort GI: soft Musculoskeletal: left leg with patchy red areas c/w venous insufficiency, not warm, no tenderness, some edema Skin: negatives: no rash Hematologic: no cervical lad  Labs: Lab Results  Component Value Date   WBC 4.5 06/29/2015   HGB 9.8 (L) 06/29/2015   HCT 31.9 (L) 06/29/2015   MCV 103.9 (H) 06/29/2015   PLT 149 (L) 06/29/2015    Lab Results  Component Value Date   CREATININE 1.07 06/29/2015   BUN 16 06/29/2015   NA 140 06/29/2015   K 4.2 06/29/2015   CL 108 06/29/2015   CO2 28 06/29/2015    Lab Results  Component Value Date   ALT 14 06/18/2012   AST 21 06/18/2012   ALKPHOS 43 06/18/2012   BILITOT 1.9 (H) 06/18/2012   INR 0.96 02/21/2009     Assessment: recurrent cellulitis. I discussed cellulitis and how it's appearance is different than venous insufficiency and different than it appears today. I discussed limiting antibiotics to when it is known to be infected to avoid other side effects and resistance.   Plan: 1) continue off of antibiotics 2) continue Eucerin lotion and triamcinolone, though I suspect most benefit is from massaging in, elevation 3) consider compression  stockings 4) rtc when there is concern for infection and I will try to work him in if available

## 2015-10-04 NOTE — Progress Notes (Signed)
DOS 06.19.2017 Left Second Toe Amputation, Possible Partal Metatarsal Amputation (bone behind toe)

## 2015-10-05 ENCOUNTER — Encounter (HOSPITAL_COMMUNITY): Payer: Self-pay

## 2015-10-05 ENCOUNTER — Inpatient Hospital Stay (HOSPITAL_COMMUNITY)
Admission: EM | Admit: 2015-10-05 | Discharge: 2015-10-08 | DRG: 602 | Disposition: A | Discharge status: Skilled Nursing Facility | Payer: Medicare Other | Attending: Internal Medicine | Admitting: Internal Medicine

## 2015-10-05 ENCOUNTER — Emergency Department (HOSPITAL_COMMUNITY): Payer: Medicare Other

## 2015-10-05 ENCOUNTER — Observation Stay (HOSPITAL_COMMUNITY): Payer: Medicare Other

## 2015-10-05 DIAGNOSIS — F0390 Unspecified dementia without behavioral disturbance: Secondary | ICD-10-CM

## 2015-10-05 DIAGNOSIS — I110 Hypertensive heart disease with heart failure: Secondary | ICD-10-CM | POA: Diagnosis present

## 2015-10-05 DIAGNOSIS — L03116 Cellulitis of left lower limb: Principal | ICD-10-CM

## 2015-10-05 DIAGNOSIS — Z66 Do not resuscitate: Secondary | ICD-10-CM | POA: Diagnosis present

## 2015-10-05 DIAGNOSIS — Z888 Allergy status to other drugs, medicaments and biological substances status: Secondary | ICD-10-CM

## 2015-10-05 DIAGNOSIS — IMO0001 Reserved for inherently not codable concepts without codable children: Secondary | ICD-10-CM

## 2015-10-05 DIAGNOSIS — E119 Type 2 diabetes mellitus without complications: Secondary | ICD-10-CM

## 2015-10-05 DIAGNOSIS — I10 Essential (primary) hypertension: Secondary | ICD-10-CM | POA: Diagnosis not present

## 2015-10-05 DIAGNOSIS — G9340 Encephalopathy, unspecified: Secondary | ICD-10-CM | POA: Diagnosis present

## 2015-10-05 DIAGNOSIS — E860 Dehydration: Secondary | ICD-10-CM | POA: Diagnosis present

## 2015-10-05 DIAGNOSIS — I4891 Unspecified atrial fibrillation: Secondary | ICD-10-CM | POA: Diagnosis present

## 2015-10-05 DIAGNOSIS — Z7982 Long term (current) use of aspirin: Secondary | ICD-10-CM

## 2015-10-05 DIAGNOSIS — R1319 Other dysphagia: Secondary | ICD-10-CM | POA: Diagnosis present

## 2015-10-05 DIAGNOSIS — R4182 Altered mental status, unspecified: Secondary | ICD-10-CM

## 2015-10-05 DIAGNOSIS — L0390 Cellulitis, unspecified: Secondary | ICD-10-CM | POA: Diagnosis present

## 2015-10-05 DIAGNOSIS — R1110 Vomiting, unspecified: Secondary | ICD-10-CM

## 2015-10-05 DIAGNOSIS — Z8673 Personal history of transient ischemic attack (TIA), and cerebral infarction without residual deficits: Secondary | ICD-10-CM

## 2015-10-05 DIAGNOSIS — L03119 Cellulitis of unspecified part of limb: Secondary | ICD-10-CM

## 2015-10-05 DIAGNOSIS — E785 Hyperlipidemia, unspecified: Secondary | ICD-10-CM | POA: Diagnosis present

## 2015-10-05 DIAGNOSIS — G9349 Other encephalopathy: Secondary | ICD-10-CM | POA: Diagnosis present

## 2015-10-05 DIAGNOSIS — I482 Chronic atrial fibrillation: Secondary | ICD-10-CM | POA: Diagnosis present

## 2015-10-05 DIAGNOSIS — I2510 Atherosclerotic heart disease of native coronary artery without angina pectoris: Secondary | ICD-10-CM | POA: Diagnosis present

## 2015-10-05 DIAGNOSIS — Z515 Encounter for palliative care: Secondary | ICD-10-CM

## 2015-10-05 DIAGNOSIS — K222 Esophageal obstruction: Secondary | ICD-10-CM | POA: Diagnosis present

## 2015-10-05 DIAGNOSIS — Z79899 Other long term (current) drug therapy: Secondary | ICD-10-CM

## 2015-10-05 DIAGNOSIS — I872 Venous insufficiency (chronic) (peripheral): Secondary | ICD-10-CM | POA: Diagnosis present

## 2015-10-05 DIAGNOSIS — Z951 Presence of aortocoronary bypass graft: Secondary | ICD-10-CM

## 2015-10-05 DIAGNOSIS — I5022 Chronic systolic (congestive) heart failure: Secondary | ICD-10-CM | POA: Diagnosis present

## 2015-10-05 DIAGNOSIS — E1151 Type 2 diabetes mellitus with diabetic peripheral angiopathy without gangrene: Secondary | ICD-10-CM | POA: Diagnosis present

## 2015-10-05 DIAGNOSIS — Z89422 Acquired absence of other left toe(s): Secondary | ICD-10-CM

## 2015-10-05 DIAGNOSIS — H9190 Unspecified hearing loss, unspecified ear: Secondary | ICD-10-CM | POA: Diagnosis present

## 2015-10-05 DIAGNOSIS — K449 Diaphragmatic hernia without obstruction or gangrene: Secondary | ICD-10-CM | POA: Diagnosis present

## 2015-10-05 DIAGNOSIS — F0150 Vascular dementia without behavioral disturbance: Secondary | ICD-10-CM | POA: Diagnosis present

## 2015-10-05 DIAGNOSIS — W19XXXA Unspecified fall, initial encounter: Secondary | ICD-10-CM

## 2015-10-05 DIAGNOSIS — R627 Adult failure to thrive: Secondary | ICD-10-CM | POA: Diagnosis present

## 2015-10-05 DIAGNOSIS — Z95810 Presence of automatic (implantable) cardiac defibrillator: Secondary | ICD-10-CM

## 2015-10-05 DIAGNOSIS — Z7189 Other specified counseling: Secondary | ICD-10-CM

## 2015-10-05 DIAGNOSIS — I472 Ventricular tachycardia: Secondary | ICD-10-CM | POA: Diagnosis present

## 2015-10-05 LAB — URINALYSIS, ROUTINE W REFLEX MICROSCOPIC
GLUCOSE, UA: NEGATIVE mg/dL
Hgb urine dipstick: NEGATIVE
KETONES UR: 15 mg/dL — AB
LEUKOCYTES UA: NEGATIVE
NITRITE: NEGATIVE
PROTEIN: NEGATIVE mg/dL
Specific Gravity, Urine: 1.025 (ref 1.005–1.030)
pH: 5.5 (ref 5.0–8.0)

## 2015-10-05 LAB — COMPREHENSIVE METABOLIC PANEL
ALT: 38 U/L (ref 17–63)
AST: 63 U/L — ABNORMAL HIGH (ref 15–41)
Albumin: 3.1 g/dL — ABNORMAL LOW (ref 3.5–5.0)
Alkaline Phosphatase: 131 U/L — ABNORMAL HIGH (ref 38–126)
Anion gap: 7 (ref 5–15)
BILIRUBIN TOTAL: 2.1 mg/dL — AB (ref 0.3–1.2)
BUN: 11 mg/dL (ref 6–20)
CHLORIDE: 110 mmol/L (ref 101–111)
CO2: 24 mmol/L (ref 22–32)
CREATININE: 1.11 mg/dL (ref 0.61–1.24)
Calcium: 8.8 mg/dL — ABNORMAL LOW (ref 8.9–10.3)
GFR, EST NON AFRICAN AMERICAN: 56 mL/min — AB (ref >60)
Glucose, Bld: 114 mg/dL — ABNORMAL HIGH (ref 65–99)
POTASSIUM: 4.1 mmol/L (ref 3.5–5.1)
Sodium: 141 mmol/L (ref 135–145)
TOTAL PROTEIN: 6.6 g/dL (ref 6.5–8.1)

## 2015-10-05 LAB — GLUCOSE, CAPILLARY: Glucose-Capillary: 113 mg/dL — ABNORMAL HIGH (ref 65–99)

## 2015-10-05 LAB — VITAMIN B12: Vitamin B-12: 869 pg/mL (ref 180–914)

## 2015-10-05 LAB — CBC
HEMATOCRIT: 35.8 % — AB (ref 39.0–52.0)
Hemoglobin: 11.2 g/dL — ABNORMAL LOW (ref 13.0–17.0)
MCH: 31.6 pg (ref 26.0–34.0)
MCHC: 31.3 g/dL (ref 30.0–36.0)
MCV: 101.1 fL — AB (ref 78.0–100.0)
PLATELETS: 163 10*3/uL (ref 150–400)
RBC: 3.54 MIL/uL — AB (ref 4.22–5.81)
RDW: 16.2 % — AB (ref 11.5–15.5)
WBC: 5.1 10*3/uL (ref 4.0–10.5)

## 2015-10-05 LAB — AMMONIA: Ammonia: 20 umol/L (ref 9–35)

## 2015-10-05 MED ORDER — PRAVASTATIN SODIUM 40 MG PO TABS
40.0000 mg | ORAL_TABLET | Freq: Every day | ORAL | Status: DC
  Administered 2015-10-06 – 2015-10-07 (×2): 40 mg via ORAL
  Filled 2015-10-05 (×2): qty 1

## 2015-10-05 MED ORDER — ONDANSETRON HCL 4 MG/2ML IJ SOLN: 4.0000 mg | Freq: Four times a day (QID) | INTRAMUSCULAR | Status: DC | PRN

## 2015-10-05 MED ORDER — CARVEDILOL 6.25 MG PO TABS
6.2500 mg | ORAL_TABLET | Freq: Two times a day (BID) | ORAL | Status: DC
  Administered 2015-10-06 – 2015-10-08 (×5): 6.25 mg via ORAL
  Filled 2015-10-05 (×5): qty 1

## 2015-10-05 MED ORDER — ONDANSETRON HCL 4 MG PO TABS: 4.0000 mg | ORAL_TABLET | Freq: Four times a day (QID) | ORAL | Status: DC | PRN

## 2015-10-05 MED ORDER — OCUVITE-LUTEIN PO CAPS
1.0000 | ORAL_CAPSULE | Freq: Two times a day (BID) | ORAL | Status: DC
  Filled 2015-10-05 (×2): qty 1

## 2015-10-05 MED ORDER — AMIODARONE HCL 200 MG PO TABS
200.0000 mg | ORAL_TABLET | ORAL | Status: DC
  Administered 2015-10-06 – 2015-10-08 (×3): 200 mg via ORAL
  Filled 2015-10-05 (×3): qty 1

## 2015-10-05 MED ORDER — DOXYCYCLINE HYCLATE 100 MG IV SOLR
100.0000 mg | Freq: Two times a day (BID) | INTRAVENOUS | Status: DC
  Administered 2015-10-05 – 2015-10-06 (×2): 100 mg via INTRAVENOUS
  Filled 2015-10-05 (×3): qty 100

## 2015-10-05 MED ORDER — SODIUM CHLORIDE 0.9 % IV BOLUS (SEPSIS)
250.0000 mL | Freq: Once | INTRAVENOUS | Status: AC
  Administered 2015-10-05: 250 mL via INTRAVENOUS

## 2015-10-05 MED ORDER — TRIAMCINOLONE ACETONIDE 0.1 % EX CREA
TOPICAL_CREAM | Freq: Two times a day (BID) | CUTANEOUS | Status: DC
  Administered 2015-10-05 – 2015-10-08 (×6): via TOPICAL
  Filled 2015-10-05: qty 15

## 2015-10-05 MED ORDER — ASPIRIN EC 81 MG PO TBEC
81.0000 mg | DELAYED_RELEASE_TABLET | Freq: Every day | ORAL | Status: DC
  Administered 2015-10-06 – 2015-10-07 (×2): 81 mg via ORAL
  Filled 2015-10-05 (×2): qty 1

## 2015-10-05 MED ORDER — ACETAMINOPHEN 650 MG RE SUPP: 650.0000 mg | Freq: Four times a day (QID) | RECTAL | Status: DC | PRN

## 2015-10-05 MED ORDER — INSULIN ASPART 100 UNIT/ML ~~LOC~~ SOLN: 0.0000 [IU] | Freq: Three times a day (TID) | SUBCUTANEOUS | Status: DC

## 2015-10-05 MED ORDER — ACETAMINOPHEN 325 MG PO TABS: 650.0000 mg | ORAL_TABLET | Freq: Four times a day (QID) | ORAL | Status: DC | PRN

## 2015-10-05 MED ORDER — MEMANTINE HCL ER 28 MG PO CP24
28.0000 mg | ORAL_CAPSULE | Freq: Every day | ORAL | Status: DC
  Administered 2015-10-06 – 2015-10-08 (×3): 28 mg via ORAL
  Filled 2015-10-05 (×3): qty 1

## 2015-10-05 MED ORDER — SODIUM CHLORIDE 0.9 % IV SOLN
INTRAVENOUS | Status: DC
  Administered 2015-10-05: 15:00:00 via INTRAVENOUS

## 2015-10-05 MED ORDER — ESCITALOPRAM OXALATE 10 MG PO TABS
10.0000 mg | ORAL_TABLET | Freq: Every day | ORAL | Status: DC
  Administered 2015-10-06 – 2015-10-08 (×3): 10 mg via ORAL
  Filled 2015-10-05 (×3): qty 1

## 2015-10-05 MED ORDER — AMIODARONE HCL 200 MG PO TABS: 200.0000 mg | ORAL_TABLET | ORAL | Status: DC

## 2015-10-05 MED ORDER — VITAMIN D 1000 UNITS PO TABS
1000.0000 [IU] | ORAL_TABLET | Freq: Every day | ORAL | Status: DC
  Administered 2015-10-06 – 2015-10-08 (×3): 1000 [IU] via ORAL
  Filled 2015-10-05 (×3): qty 1

## 2015-10-05 MED ORDER — INSULIN ASPART 100 UNIT/ML ~~LOC~~ SOLN
0.0000 [IU] | SUBCUTANEOUS | Status: DC
  Administered 2015-10-06: 1 [IU] via SUBCUTANEOUS
  Administered 2015-10-06: 2 [IU] via SUBCUTANEOUS
  Administered 2015-10-07 (×4): 1 [IU] via SUBCUTANEOUS
  Administered 2015-10-08: 2 [IU] via SUBCUTANEOUS

## 2015-10-05 MED ORDER — SODIUM CHLORIDE 0.9 % IV SOLN
INTRAVENOUS | Status: AC
  Administered 2015-10-05: 16:00:00 via INTRAVENOUS

## 2015-10-05 MED ORDER — ENOXAPARIN SODIUM 60 MG/0.6ML ~~LOC~~ SOLN
55.0000 mg | SUBCUTANEOUS | Status: DC
  Administered 2015-10-05 – 2015-10-07 (×3): 55 mg via SUBCUTANEOUS
  Filled 2015-10-05 (×3): qty 0.6

## 2015-10-05 MED ORDER — RISAQUAD PO CAPS
2.0000 | ORAL_CAPSULE | Freq: Every day | ORAL | Status: DC
  Administered 2015-10-06 – 2015-10-08 (×3): 2 via ORAL
  Filled 2015-10-05 (×3): qty 2

## 2015-10-05 NOTE — Progress Notes (Addendum)
Pt had 4 beats run of Vtach at 2036, twice with in 1 minute. VS stable, pt in no distress. MD notified at 2058. Order for Magnesium placed.

## 2015-10-05 NOTE — Progress Notes (Signed)
Pt has a [employer] dual chamber pacemaker that was placed in 2010 and is not MRI compatible.  Exam will be cancelled

## 2015-10-05 NOTE — ED Notes (Signed)
MD at bedside. 

## 2015-10-05 NOTE — Progress Notes (Signed)
Pt NPO, PO meds scheduled for 2200. Difficult to arouse. MD notified. Instruction given to hold PO meds until diet order is placed.

## 2015-10-05 NOTE — H&P (Signed)
History and Physical    John [name] FBP:[id number] DOB: [birth year] DOA: 10/05/2015  PCP: Ezequiel Kayser, MD Patient coming from: [facility]  Chief Complaint: ams/fall  HPI: John [name] is a demented 82 y.o. male with medical history significant for recent cellulitis of the left leg hypertension, A. fib, CAD, pacemaker vascular dementia CHF presents to the emergency department chief complaint worsening altered mental status. Initial evaluation reveals mild dehydration acute encephalopathy and cellulitis of the left leg.  Information is obtained from the wife and the son who are at the bedside. Currently patient and wife live independently living at [facility]. Wife states over the last 2-3 months patient has gradually worsened in terms of energy stamina appetite sensorium. 2 months ago he had cellulitis of the left leg and osteomyelitis of toe on left foot status post amputation. He has completed antibiotics and is under the care of a podiatrist. Wife reports a typical day with patient over the last several weeks is mostly sleeping. She reports difficulty awakening him in the morning. He usually gets up around 11 or 12 does manage to walk to the cafeteria but falls asleep at the table. His oral intake is quite diminished. Then goes back to the room takes a nap and remains lethargic. this Morning he fell. Wife reports he did not hit his head or lose consciousness. She states he was trying to sit up in the chair. There has been been no recent fever chills cough diarrhea. No complaints of chest pain abdominal pain no nausea or vomiting. He is under the care of a podiatrist status post amputation of the toe but son reports he has an appointment this week and expects to be released from care.    ED Course: In the emergency department he is afebrile hemodynamically stable and not hypoxic. He is provided with IV fluids  Review of Systems: As per HPI otherwise 10 point review  of systems negative.   Ambulatory Status: Has developed a very unsteady gait over the last several weeks has had increased falls since April of this year  Past Medical History:  Diagnosis Date  . AICD (automatic cardioverter/defibrillator) present   . Arthritis   . Atrial fibrillation (HCC)    Permanent  . BPH (benign prostatic hyperplasia)   . CAD (coronary artery disease)    s/p CABG; Failed LAD PCI 2010  . Carotid artery occlusion   . CHF (congestive heart failure) (HCC)   . Decreased hearing   . Dementia   . DM2 (diabetes mellitus, type 2) (HCC)   . Dysrhythmia    hx atrial fib ,tachycardia  . Gilbert syndrome   . Headache   . HLD (hyperlipidemia)   . HTN (hypertension)   . Memory loss   . Presence of permanent cardiac pacemaker   . Stroke [facility]) ?10  . Subdural hematoma (HCC) 11/05   Precluding Coumadin  . Ventricular tachycardia (HCC)    Syncope-2010 treated with amiodarone    Past Surgical History:  Procedure Laterality Date  . AMPUTATION Left 06/28/2015   Procedure: AMPUTATION SECOND LEFT TOE;  Surgeon: Vivi Barrack, DPM;  Location: [facility] OR;  Service: Podiatry;  Laterality: Left;  . BRAIN SURGERY  05   subdural hematoma  . CORONARY ARTERY BYPASS GRAFT  1998  . HEMORRHOID SURGERY    . PACEMAKER INSERTION  ?10   [employer]  . PERIPHERAL VASCULAR CATHETERIZATION N/A 05/17/2015   Procedure: Abdominal Aortogram w/Lower Extremity;  Surgeon: Di Kindle  Edilia Bo, MD;  Location: [facility] INVASIVE CV LAB;  Service: Cardiovascular;  Laterality: N/A;  . TOE AMPUTATION Left 06/28/2015   2nd toe    Social History   Social History  . Marital status: Married    Spouse name: N/A  . Number of children: N/A  . Years of education: N/A   Occupational History  . retired principal    Social History Main Topics  . Smoking status: Never Smoker  . Smokeless tobacco: Never Used  . Alcohol use No  . Drug use: No  . Sexual activity: Not on file   Other Topics Concern    . Not on file   Social History Narrative  . No narrative on file    Allergies  Allergen Reactions  . Metronidazole     [place]    Family History  Problem Relation Age of Onset  . Coronary artery disease Mother   . Cancer Father     Prior to Admission medications   Medication Sig Start Date End Date Taking? Authorizing Provider  acidophilus (RISAQUAD) CAPS capsule Take 2 capsules by mouth daily. 07/01/15  Yes Starleen Arms, MD  amiodarone (PACERONE) 200 MG tablet Take 200 mg by mouth See admin instructions. Pt only takes on Sunday, Monday, Wednesday, Thursday, and Friday.   Yes Historical Provider, MD  aspirin EC 81 MG tablet Take 81 mg by mouth at bedtime.   Yes Historical Provider, MD  carvedilol (COREG) 6.25 MG tablet Take 1 tablet (6.25 mg total) by mouth 2 (two) times daily with a meal. 03/03/15  Yes Duke Salvia, MD  cholecalciferol (VITAMIN D) 1000 units tablet Take 1,000 Units by mouth daily.   Yes Historical Provider, MD  escitalopram (LEXAPRO) 10 MG tablet Take 10 mg by mouth daily.   Yes Historical Provider, MD  meclizine (ANTIVERT) 25 MG tablet Take 12.5-25 mg by mouth 4 (four) times daily as needed for dizziness. 09/22/15  Yes Historical Provider, MD  Multiple Vitamins-Minerals (PRESERVISION AREDS 2) CAPS Take 1 capsule by mouth 2 (two) times daily.   Yes Historical Provider, MD  NAMENDA XR 28 MG CP24 24 hr capsule Take 28 mg by mouth daily.    Yes Historical Provider, MD  pravastatin (PRAVACHOL) 40 MG tablet Take 40 mg by mouth at bedtime.    Yes Historical Provider, MD  rivastigmine (EXELON) 9.5 mg/24hr Place 1 patch onto the skin at bedtime.    Yes Historical Provider, MD  triamcinolone cream (KENALOG) 0.1 % Apply 1 application topically 2 (two) times daily as needed. legs 09/03/15  Yes Historical Provider, MD    Physical Exam: Vitals:   10/05/15 1500 10/05/15 1515 10/05/15 1530 10/05/15 1545  BP: 131/76 134/82 131/84 142/77  Pulse: 70 70 71 70  Resp: 17  19 23 26   Temp:      TempSrc:      SpO2: 96% 99% 100% 100%  Weight:      Height:         General:  Appears Slightly agitated somewhat pale no acute distress Eyes:  PERRL, EOMI, normal lids, iris ENT:  grossly normal hearing, lips & tongue, his membranes of his mouth are pink but somewhat dry Neck:  no LAD, masses or thyromegaly Cardiovascular:  RRR, no m/r/g. No LE edema.  Respiratory:  CTA bilaterally, no w/r/r. Mild increased work of breathing some use of abdominal accessory muscles. Abdomen:  soft, ntnd, positive bowel sounds but somewhat sluggish no guarding or rebounding Skin:   left foot  left ankle midway up left shin with erythema some swelling that appears somewhat chronic. Left anterior/lateral lower leg with a bright red patch mild heat tenderness to palpation. Musculoskeletal:  grossly normal tone BUE/BLE, good ROM, no bony abnormality Psychiatric:  grossly normal mood and affect, speech fluent and appropriate, AOx3 Neurologic:  Obtunded but will arouse to verbal stimuli when awake follows commands attempts to answer questions moves all extremities  Labs on Admission: I have personally reviewed following labs and imaging studies  CBC:  Recent Labs Lab 10/05/15 1245  WBC 5.1  HGB 11.2*  HCT 35.8*  MCV 101.1*  PLT 163   Basic Metabolic Panel:  Recent Labs Lab 10/05/15 1245  NA 141  K 4.1  CL 110  CO2 24  GLUCOSE 114*  BUN 11  CREATININE 1.11  CALCIUM 8.8*   GFR: Estimated Creatinine Clearance: 54.8 mL/min (by C-G formula based on SCr of 1.11 mg/dL). Liver Function Tests:  Recent Labs Lab 10/05/15 1245  AST 63*  ALT 38  ALKPHOS 131*  BILITOT 2.1*  PROT 6.6  ALBUMIN 3.1*   No results for input(s): LIPASE, AMYLASE in the last 168 hours. No results for input(s): AMMONIA in the last 168 hours. Coagulation Profile: No results for input(s): INR, PROTIME in the last 168 hours. Cardiac Enzymes: No results for input(s): CKTOTAL, CKMB, CKMBINDEX,  TROPONINI in the last 168 hours. BNP (last 3 results) No results for input(s): PROBNP in the last 8760 hours. HbA1C: No results for input(s): HGBA1C in the last 72 hours. CBG: No results for input(s): GLUCAP in the last 168 hours. Lipid Profile: No results for input(s): CHOL, HDL, LDLCALC, TRIG, CHOLHDL, LDLDIRECT in the last 72 hours. Thyroid Function Tests: No results for input(s): TSH, T4TOTAL, FREET4, T3FREE, THYROIDAB in the last 72 hours. Anemia Panel: No results for input(s): VITAMINB12, FOLATE, FERRITIN, TIBC, IRON, RETICCTPCT in the last 72 hours. Urine analysis:    Component Value Date/Time   COLORURINE YELLOW 10/05/2015 1450   APPEARANCEUR CLEAR 10/05/2015 1450   LABSPEC 1.025 10/05/2015 1450   PHURINE 5.5 10/05/2015 1450   GLUCOSEU NEGATIVE 10/05/2015 1450   HGBUR NEGATIVE 10/05/2015 1450   BILIRUBINUR SMALL (A) 10/05/2015 1450   KETONESUR 15 (A) 10/05/2015 1450   PROTEINUR NEGATIVE 10/05/2015 1450   UROBILINOGEN 1.0 02/23/2009 2225   NITRITE NEGATIVE 10/05/2015 1450   LEUKOCYTESUR NEGATIVE 10/05/2015 1450    Creatinine Clearance: Estimated Creatinine Clearance: 54.8 mL/min (by C-G formula based on SCr of 1.11 mg/dL).  Sepsis Labs: @LABRCNTIP (procalcitonin:4,lacticidven:4) )No results found for this or any previous visit (from the past 240 hour(s)).   Radiological Exams on Admission: Dg Chest 2 View  Result Date: 10/05/2015 CLINICAL DATA:  Weakness and decreased appetite. Status post fall. Altered mental status. EXAM: CHEST  2 VIEW COMPARISON:  Chest radiograph 04/30/2013 FINDINGS: Left chest wall biventricular pacemaker/ AICD with leads in expected location overlying the left and right ventricles. Median sternotomy wires and CABG markers are unchanged. There is aortic atherosclerosis. Cardiomediastinal contours are unchanged. No pneumothorax or sizable pleural effusion. No focal airspace consolidation. Mild pulmonary vascular congestion without overt pulmonary  edema. Multilevel thoracic vertebral body osteophytosis and height loss. IMPRESSION: 1. No radiographic evidence of pneumonia. 2. Central pulmonary vascular congestion without overt pulmonary edema. 3. Aortic atherosclerosis. Electronically Signed   By: 05/02/2013 M.D.   On: 10/05/2015 13:57   Ct Head Wo Contrast  Result Date: 10/05/2015 CLINICAL DATA:  Altered mental status EXAM: CT HEAD WITHOUT CONTRAST TECHNIQUE: Contiguous axial images  were obtained from the base of the skull through the vertex without intravenous contrast. COMPARISON:  08/28/2015 FINDINGS: Brain: No evidence of acute infarction, hemorrhage, hydrocephalus, extra-axial collection or mass lesion/mass effect. Asymmetric right cerebral atrophy with infarct related gliosis and encephalomalacia in all vascular territories. Small-vessel ischemic changes in the cerebral white matter and basal ganglia. Vascular: Diffuse atherosclerotic calcification. Chronic collapsed appearance of the right cervical ICA. No right ICA flow void on 09/26/2015 brain MRI. Skull: No acute finding.  Remote left parietal craniotomy. Sinuses/Orbits: Bilateral cataract resection. Mild chronic sinusitis. No acute finding. IMPRESSION: 1. No acute finding. 2. Extensive chronic ischemic injury, especially in the right cerebral hemisphere in the setting of chronic right ICA occlusion. Electronically Signed   By: Marnee Spring M.D.   On: 10/05/2015 14:12    EKG: Independently reviewed. Ventricular-paced complexes No further analysis attempted due to paced rhythm  Assessment/Plan Principal Problem:   Acute encephalopathy Active Problems:   Diabetes (HCC)   Essential hypertension   Atrial fibrillation (HCC)   CAD (coronary artery disease)   Vascular dementia, uncomplicated   Cellulitis   Altered mental status   Dehydration    #1. Acute encephalopathy. Etiology uncertain. Suspect gradual worsening of dementia in the setting of cellulitis flare and possible  mild dehydration. Currently afebrile hemodynamically stable not hypoxic. Urinalysis unremarkable. Chest x-ray without infiltrate. CT of the head without acute abnormality. EKG without indications of ischemia. No metabolic derangement. -Admit to telemetry -Obtain B-12, folate RPR -check ammonia level -Doxycycline 500 mg IV twice a day -Gentle IV fluids -Palliative care consult for goals of care/end-of-life -Social work/case management for placement. Patient may need higher level of care as wife no longer able to manage  #2. Cellulitis. Left lower leg. Patient with a recent history of cellulitis/osteomyelitis status post amputation of second toe on left foot. He has completed antibiotics is currently under the care [facility]. No leukocytosis he's afebrile hemodynamically stable and nontoxic appearing. -Doxycycline IV every 12 -continue triamcinolone cream  #3. Hypertension. Controlled in the emergency department. Home medications include Coreg -Continue Coreg  4. A. fib. EKG as noted above. Not on anticoagulation secondary to history of subdural hematoma. [place] vasc score4 -Continue Coreg  #5. CAD/CHF/peripheral vascular disease Status post pacemaker. EKG as noted above. No reports of any chest pain or shortness of breath. Home medications include beta blocker and amiodarone. -Chart review indicates pacemaker interrogated March 2017 -Continue home meds -Monitor intake and output -Daily weights  #6. Diabetes. Diet controlled. 7 A1c 5.8 Serum glucose 114 on admission. Metformin recently discontinued -Carb modified diet  #7.Dehydration likely related to decreased oral intake secondary to #1 and progression of disease.  -gently iv fluids -monitor -orthostatic vs when able.  -physical therapy when able    DVT prophylaxis: lovenox Code Status: dnr  Family Communication: wife son at bedside  Disposition Plan: will likely need higher level of care  Consults called: palliative  care  Admission status: obs    Gwenyth Bender MD [facility]  If 7PM-7AM, please contact night-coverage [email or web address] Password [facility]  10/05/2015, 4:19 PM

## 2015-10-05 NOTE — ED Provider Notes (Signed)
[facility]-EMERGENCY DEPT Provider Note   CSN: [id number] Arrival date & time: 10/05/15  1229     History   Chief Complaint Chief Complaint  Patient presents with  . Weakness    HPI John [name] is a 82 y.o. male.  Patient brought in by EMS from [facility]. Where he is at independent living with his wife. Patient fell while trying to sit in a chair. He fell forward. Did not hit his head. Patient has had increased falls since April 2017. The family reports that for the past month he's had sniffing and decrease in his mental status. For the last few days he not been eating or drinking anything. Patient has a history of atrial fib and V. tach and has a pacemaker. Wife states that patient's been very sleepy. Patient is at independent living facility at [facility].      Past Medical History:  Diagnosis Date  . AICD (automatic cardioverter/defibrillator) present   . Arthritis   . Atrial fibrillation (HCC)    Permanent  . BPH (benign prostatic hyperplasia)   . CAD (coronary artery disease)    s/p CABG; Failed LAD PCI 2010  . Carotid artery occlusion   . CHF (congestive heart failure) (HCC)   . Decreased hearing   . Dementia   . DM2 (diabetes mellitus, type 2) (HCC)   . Dysrhythmia    hx atrial fib ,tachycardia  . Gilbert syndrome   . Headache   . HLD (hyperlipidemia)   . HTN (hypertension)   . Memory loss   . Presence of permanent cardiac pacemaker   . Stroke [facility]) ?10  . Subdural hematoma (HCC) 11/05   Precluding Coumadin  . Ventricular tachycardia (HCC)    Syncope-2010 treated with amiodarone    Patient Active Problem List   Diagnosis Date Noted  . Acute encephalopathy 10/05/2015  . Altered mental status 10/05/2015  . Cellulitis 06/28/2015  . CHF (congestive heart failure) (HCC)   . Diabetes mellitus with complication (HCC)   . Nonhealing ulcer of left lower extremity (HCC) 06/17/2015  . Acute osteomyelitis of ankle and foot (HCC) 06/09/2015  .  Atherosclerosis of artery of extremity with ulceration (HCC) 01/21/2014  . Vascular dementia, uncomplicated 09/18/2012  . Unspecified late effects of cerebrovascular disease 09/18/2012  . Edema 04/05/2010  . [employer] 04/05/2010  . Ventricular tachycardia (HCC)   . Atrial fibrillation (HCC)   . CAD (coronary artery disease)   . Diabetes (HCC) 01/12/2009  . GILBERT'S SYNDROME 01/12/2009  . Essential hypertension 01/12/2009  . CARDIOMYOPATHY, ISCHEMIC 01/12/2009  . VENTRICULAR TACHYCARDIA 01/12/2009  . SICK SINUS/ TACHY-BRADY SYNDROME 01/12/2009    Past Surgical History:  Procedure Laterality Date  . AMPUTATION Left 06/28/2015   Procedure: AMPUTATION SECOND LEFT TOE;  Surgeon: Vivi Barrack, DPM;  Location: [facility] OR;  Service: Podiatry;  Laterality: Left;  . BRAIN SURGERY  05   subdural hematoma  . CORONARY ARTERY BYPASS GRAFT  1998  . HEMORRHOID SURGERY    . PACEMAKER INSERTION  ?10   [employer]  . PERIPHERAL VASCULAR CATHETERIZATION N/A 05/17/2015   Procedure: Abdominal Aortogram w/Lower Extremity;  Surgeon: Chuck Hint, MD;  Location: [facility] INVASIVE CV LAB;  Service: Cardiovascular;  Laterality: N/A;  . TOE AMPUTATION Left 06/28/2015   2nd toe       Home Medications    Prior to Admission medications   Medication Sig Start Date End Date Taking? Authorizing Provider  acidophilus (RISAQUAD) CAPS capsule Take  2 capsules by mouth daily. 07/01/15  Yes Starleen Arms, MD  amiodarone (PACERONE) 200 MG tablet Take 200 mg by mouth See admin instructions. Pt only takes on Sunday, Monday, Wednesday, Thursday, and Friday.   Yes Historical Provider, MD  aspirin EC 81 MG tablet Take 81 mg by mouth at bedtime.   Yes Historical Provider, MD  carvedilol (COREG) 6.25 MG tablet Take 1 tablet (6.25 mg total) by mouth 2 (two) times daily with a meal. 03/03/15  Yes Duke Salvia, MD  cholecalciferol (VITAMIN D) 1000 units tablet Take 1,000 Units by mouth  daily.   Yes Historical Provider, MD  escitalopram (LEXAPRO) 10 MG tablet Take 10 mg by mouth daily.   Yes Historical Provider, MD  meclizine (ANTIVERT) 25 MG tablet Take 12.5-25 mg by mouth 4 (four) times daily as needed for dizziness. 09/22/15  Yes Historical Provider, MD  Multiple Vitamins-Minerals (PRESERVISION AREDS 2) CAPS Take 1 capsule by mouth 2 (two) times daily.   Yes Historical Provider, MD  NAMENDA XR 28 MG CP24 24 hr capsule Take 28 mg by mouth daily.    Yes Historical Provider, MD  pravastatin (PRAVACHOL) 40 MG tablet Take 40 mg by mouth at bedtime.    Yes Historical Provider, MD  rivastigmine (EXELON) 9.5 mg/24hr Place 1 patch onto the skin at bedtime.    Yes Historical Provider, MD  triamcinolone cream (KENALOG) 0.1 % Apply 1 application topically 2 (two) times daily as needed. legs 09/03/15  Yes Historical Provider, MD    Family History Family History  Problem Relation Age of Onset  . Coronary artery disease Mother   . Cancer Father     Social History Social History  Substance Use Topics  . Smoking status: Never Smoker  . Smokeless tobacco: Never Used  . Alcohol use No     Allergies   Metronidazole   Review of Systems Review of Systems  Unable to perform ROS: Mental status change     Physical Exam Updated Vital Signs BP 133/65   Pulse 71   Temp 97.6 F (36.4 C) (Axillary)   Resp 18   Ht 5\' 11"  (1.803 m)   Wt 110.2 kg   SpO2 95%   BMI 33.89 kg/m   Physical Exam  Constitutional: He appears well-developed and well-nourished. No distress.  HENT:  Head: Normocephalic and atraumatic.  Mucous membranes dry.  Eyes: EOM are normal. Pupils are equal, round, and reactive to light.  Neck: Neck supple.  Cardiovascular: Normal rate, regular rhythm and normal heart sounds.   Pulmonary/Chest: Effort normal and breath sounds normal. No respiratory distress. He has no wheezes.  Abdominal: Soft. Bowel sounds are normal. There is no tenderness.  Musculoskeletal:  Normal range of motion.  Neurological:  Patient very sleepy but will at times wake up. Not good at following commands.  Skin: No erythema.  Nursing note and vitals reviewed.    ED Treatments / Results  Labs (all labs ordered are listed, but only abnormal results are displayed) Labs Reviewed  COMPREHENSIVE METABOLIC PANEL - Abnormal; Notable for the following:       Result Value   Glucose, Bld 114 (*)    Calcium 8.8 (*)    Albumin 3.1 (*)    AST 63 (*)    Alkaline Phosphatase 131 (*)    Total Bilirubin 2.1 (*)    GFR calc non Af Amer 56 (*)    All other components within normal limits  CBC - Abnormal; Notable for the  following:    RBC 3.54 (*)    Hemoglobin 11.2 (*)    HCT 35.8 (*)    MCV 101.1 (*)    RDW 16.2 (*)    All other components within normal limits  URINALYSIS, ROUTINE W REFLEX MICROSCOPIC (NOT AT [facility]) - Abnormal; Notable for the following:    Bilirubin Urine SMALL (*)    Ketones, ur 15 (*)    All other components within normal limits    EKG  EKG Interpretation  Date/Time:  Tuesday October 05 2015 12:40:03 EDT Ventricular Rate:  70 PR Interval:    QRS Duration: 155 QT Interval:  489 QTC Calculation: 528 R Axis:   -104 Text Interpretation:  Ventricular-paced complexes No further analysis attempted due to paced rhythm Confirmed by Janiyah Beery  MD, Lorin Picket ([id number]) on 10/05/2015 1:35:09 PM Also confirmed by Deretha Emory  MD, Lavonta Tillis [phone]), editor St[place] CT, Jola Babinski [phone])  on 10/05/2015 2:08:00 PM       Radiology Dg Chest 2 View  Result Date: 10/05/2015 CLINICAL DATA:  Weakness and decreased appetite. Status post fall. Altered mental status. EXAM: CHEST  2 VIEW COMPARISON:  Chest radiograph 04/30/2013 FINDINGS: Left chest wall biventricular pacemaker/ AICD with leads in expected location overlying the left and right ventricles. Median sternotomy wires and CABG markers are unchanged. There is aortic atherosclerosis. Cardiomediastinal contours are unchanged. No  pneumothorax or sizable pleural effusion. No focal airspace consolidation. Mild pulmonary vascular congestion without overt pulmonary edema. Multilevel thoracic vertebral body osteophytosis and height loss. IMPRESSION: 1. No radiographic evidence of pneumonia. 2. Central pulmonary vascular congestion without overt pulmonary edema. 3. Aortic atherosclerosis. Electronically Signed   By: Deatra Robinson M.D.   On: 10/05/2015 13:57   Ct Head Wo Contrast  Result Date: 10/05/2015 CLINICAL DATA:  Altered mental status EXAM: CT HEAD WITHOUT CONTRAST TECHNIQUE: Contiguous axial images were obtained from the base of the skull through the vertex without intravenous contrast. COMPARISON:  08/28/2015 FINDINGS: Brain: No evidence of acute infarction, hemorrhage, hydrocephalus, extra-axial collection or mass lesion/mass effect. Asymmetric right cerebral atrophy with infarct related gliosis and encephalomalacia in all vascular territories. Small-vessel ischemic changes in the cerebral white matter and basal ganglia. Vascular: Diffuse atherosclerotic calcification. Chronic collapsed appearance of the right cervical ICA. No right ICA flow void on 09/26/2015 brain MRI. Skull: No acute finding.  Remote left parietal craniotomy. Sinuses/Orbits: Bilateral cataract resection. Mild chronic sinusitis. No acute finding. IMPRESSION: 1. No acute finding. 2. Extensive chronic ischemic injury, especially in the right cerebral hemisphere in the setting of chronic right ICA occlusion. Electronically Signed   By: Marnee Spring M.D.   On: 10/05/2015 14:12    Procedures Procedures (including critical care time)  Medications Ordered in ED Medications  0.9 %  sodium chloride infusion ( Intravenous New Bag/Given 10/05/15 1453)  0.9 %  sodium chloride infusion (not administered)  aspirin EC tablet 81 mg (not administered)  cholecalciferol (VITAMIN D) tablet 1,000 Units (not administered)  PRESERVISION AREDS 2 CAPS 1 capsule (not  administered)  acidophilus (RISAQUAD) capsule 2 capsule (not administered)  escitalopram (LEXAPRO) tablet 10 mg (not administered)  memantine (NAMENDA XR) 24 hr capsule 28 mg (not administered)  carvedilol (COREG) tablet 6.25 mg (not administered)  amiodarone (PACERONE) tablet 200 mg (not administered)  pravastatin (PRAVACHOL) tablet 40 mg (not administered)  enoxaparin (LOVENOX) injection 40 mg (not administered)  acetaminophen (TYLENOL) tablet 650 mg (not administered)    Or  acetaminophen (TYLENOL) suppository 650 mg (not administered)  ondansetron (ZOFRAN) tablet 4  mg (not administered)    Or  ondansetron (ZOFRAN) injection 4 mg (not administered)  doxycycline (VIBRAMYCIN) 100 mg in dextrose 5 % 250 mL IVPB (not administered)  sodium chloride 0.9 % bolus 250 mL (250 mLs Intravenous New Bag/Given 10/05/15 1452)     Initial Impression / Assessment and Plan / ED Course  I have reviewed the triage vital signs and the nursing notes.  Pertinent labs & imaging results that were available during my care of the patient were reviewed by me and considered in my medical decision making (see chart for details).  Clinical Course    Patient with a one-month the slow change in mental status. Patient with increased falls starting back in April 2017. Patient over the last 2 weeks is gotten significantly worse. Now not eating or drinking. There was a fall today while he was trying to sit down. But no obvious injuries. Workup without any acute findings to include head CT and labs urinalysis still pending. Also chest x-ray done. Discussed with hospitalist they will admit patient is from [facility]. Case manager also consult to to look into the availability of moving up to a higher level of nursing care at that facility. Family states that is not automatic. Labs without significant abnormalities.  Final Clinical Impressions(s) / ED Diagnoses   Final diagnoses:  Altered mental status, unspecified  altered mental status type  Fall, initial encounter    New Prescriptions New Prescriptions   No medications on file     Vanetta Mulders, MD 10/05/15 1551

## 2015-10-05 NOTE — ED Triage Notes (Addendum)
Per EMS - pt from [facility]. Pt fell this morning, no traumatic injury, did not hit head. Weakness x several months, worse over 2-3 days. Pt has decreased appetite. Negative for orthostatic changes. Hx afib, vtach. Pt has pacemaker. Hx dementia, a&o at baseline (does not know year).

## 2015-10-05 NOTE — ED Notes (Signed)
Attempted to call report

## 2015-10-06 DIAGNOSIS — F0390 Unspecified dementia without behavioral disturbance: Secondary | ICD-10-CM | POA: Diagnosis not present

## 2015-10-06 DIAGNOSIS — R627 Adult failure to thrive: Secondary | ICD-10-CM | POA: Diagnosis not present

## 2015-10-06 DIAGNOSIS — I10 Essential (primary) hypertension: Secondary | ICD-10-CM | POA: Diagnosis not present

## 2015-10-06 DIAGNOSIS — Z7189 Other specified counseling: Secondary | ICD-10-CM | POA: Diagnosis not present

## 2015-10-06 DIAGNOSIS — G9340 Encephalopathy, unspecified: Secondary | ICD-10-CM | POA: Diagnosis not present

## 2015-10-06 DIAGNOSIS — Z515 Encounter for palliative care: Secondary | ICD-10-CM

## 2015-10-06 DIAGNOSIS — I480 Paroxysmal atrial fibrillation: Secondary | ICD-10-CM | POA: Diagnosis not present

## 2015-10-06 LAB — GLUCOSE, CAPILLARY
GLUCOSE-CAPILLARY: 114 mg/dL — AB (ref 65–99)
GLUCOSE-CAPILLARY: 152 mg/dL — AB (ref 65–99)
Glucose-Capillary: 105 mg/dL — ABNORMAL HIGH (ref 65–99)
Glucose-Capillary: 105 mg/dL — ABNORMAL HIGH (ref 65–99)
Glucose-Capillary: 126 mg/dL — ABNORMAL HIGH (ref 65–99)
Glucose-Capillary: 137 mg/dL — ABNORMAL HIGH (ref 65–99)
Glucose-Capillary: 99 mg/dL (ref 65–99)

## 2015-10-06 LAB — BASIC METABOLIC PANEL
ANION GAP: 12 (ref 5–15)
BUN: 10 mg/dL (ref 6–20)
CALCIUM: 9.2 mg/dL (ref 8.9–10.3)
CO2: 25 mmol/L (ref 22–32)
Chloride: 106 mmol/L (ref 101–111)
Creatinine, Ser: 0.98 mg/dL (ref 0.61–1.24)
GFR calc Af Amer: >60 mL/min (ref >60)
GLUCOSE: 125 mg/dL — AB (ref 65–99)
POTASSIUM: 3.8 mmol/L (ref 3.5–5.1)
SODIUM: 143 mmol/L (ref 135–145)

## 2015-10-06 LAB — MAGNESIUM: Magnesium: 1.9 mg/dL (ref 1.7–2.4)

## 2015-10-06 LAB — CBC
HEMATOCRIT: 39 % (ref 39.0–52.0)
Hemoglobin: 12.1 g/dL — ABNORMAL LOW (ref 13.0–17.0)
MCH: 31.5 pg (ref 26.0–34.0)
MCHC: 31 g/dL (ref 30.0–36.0)
MCV: 101.6 fL — ABNORMAL HIGH (ref 78.0–100.0)
Platelets: 148 10*3/uL — ABNORMAL LOW (ref 150–400)
RBC: 3.84 MIL/uL — ABNORMAL LOW (ref 4.22–5.81)
RDW: 16.4 % — AB (ref 11.5–15.5)
WBC: 5.5 10*3/uL (ref 4.0–10.5)

## 2015-10-06 LAB — RPR: RPR: NONREACTIVE

## 2015-10-06 LAB — HEMOGLOBIN A1C
HEMOGLOBIN A1C: 5.8 % — AB (ref 4.8–5.6)
Mean Plasma Glucose: 120 mg/dL

## 2015-10-06 MED ORDER — ORAL CARE MOUTH RINSE
15.0000 mL | Freq: Two times a day (BID) | OROMUCOSAL | Status: DC
  Administered 2015-10-06 – 2015-10-08 (×6): 15 mL via OROMUCOSAL

## 2015-10-06 MED ORDER — PROSIGHT PO TABS
1.0000 | ORAL_TABLET | Freq: Two times a day (BID) | ORAL | Status: DC
  Administered 2015-10-06 – 2015-10-08 (×5): 1 via ORAL
  Filled 2015-10-06 (×6): qty 1

## 2015-10-06 MED ORDER — VANCOMYCIN HCL 10 G IV SOLR
2000.0000 mg | Freq: Once | INTRAVENOUS | Status: AC
  Administered 2015-10-06: 2000 mg via INTRAVENOUS
  Filled 2015-10-06: qty 2000

## 2015-10-06 MED ORDER — VANCOMYCIN HCL 10 G IV SOLR
1250.0000 mg | INTRAVENOUS | Status: DC
  Administered 2015-10-07: 1250 mg via INTRAVENOUS
  Filled 2015-10-06 (×2): qty 1250

## 2015-10-06 MED ORDER — SODIUM CHLORIDE 0.9 % IV SOLN
INTRAVENOUS | Status: DC
  Administered 2015-10-06 – 2015-10-07 (×3): via INTRAVENOUS

## 2015-10-06 NOTE — Care Management Note (Addendum)
Case Management Note  Patient Details  Name: John [name] MRN: [id number] Date of Birth: Dec 19, 1922  Subjective/Objective:     Admitted with LE cellulitis, hx of A. fib, hypertension, recurrent cellulitis and venous stasis dermatitis of the left lower extremity, CAD, pacemaker placement, dementia, CHF.       Action/Plan: PT evaluation pending...Marland KitchenMarland KitchenMarland Kitchen CM to f/u with disposition needs.   Expected Discharge Date:                  Expected Discharge Plan:  Skilled Nursing Facility ( Pt is from [facility]. Disposition @ d/c SNF.)  In-House Referral:  Clinical Social Work  Discharge planning Services  CM Consult  Post Acute Care Choice:    Choice offered to:     DME Arranged:    DME Agency:     HH Arranged:   Home Hospice/Pt active with    [facility] Agency:   [facility]  Status of Service:  In process, will continue to follow  If discussed at Long Length of Stay Meetings, dates discussed:    Additional Comments:  Epifanio Lesches, RN 10/06/2015, 12:10 PM

## 2015-10-06 NOTE — Clinical Social Work Note (Signed)
Clinical Social Work Assessment  Patient Details  Name: John [name] MRN: [id number] Date of Birth: 1923-01-02  Date of referral:  10/06/15               Reason for consult:  Facility Placement                Permission sought to share information with:  Facility Sport and exercise psychologist, Family Supports Permission granted to share information::  No (Patient is disoriented)  Name::     Spouse and Artist::  SNFs  Relationship::  Son  Contact Information:  [phone]  Housing/Transportation Living arrangements for the past 2 months:  [place] of Information:  Adult Children, Spouse Patient Interpreter Needed:  None Criminal Activity/Legal Involvement Pertinent to Current Situation/Hospitalization:  No - Comment as needed Significant Relationships:  Adult Children, Spouse Lives with:  Spouse Do you feel safe going back to the place where you live?  No Need for family participation in patient care:  Yes (Comment)  Care giving concerns:  CSW received consult for possible SNF placement at time of discharge. Patient is disoriented. CSW met with patient's oldest son and patient's spouse at bedside regarding possible SNF placement at time of discharge. Patient and his wife live at [place]. Per patient's spouse, patient's spouse is currently unable to care for patient at their home given patient's current physical needs and fall risk. Patient's son and patient's spouse expressed understanding of PT recommendation and are agreeable to SNF placement at time of discharge if PT recommends it. CSW to continue to follow and assist with discharge planning needs.   Social Worker assessment / plan:  CSW spoke with patient's son and patient's spouse concerning possibility of rehab at [facility] before returning home.  Employment status:  Retired Nurse, adult PT Recommendations:  Not assessed at this time Information /  Referral to community resources:  [place]  Patient/Family's Response to care:  Patient's son and patient's spouse recognize need for rehab before returning home and are agreeable to a SNF in [place].  Patient/Family's Understanding of and Emotional Response to Diagnosis, Current Treatment, and Prognosis:  Patient/family is realistic regarding therapy needs and expressed being hopeful for SNF placement. Patient's son expressed understanding of CSW role and discharge process. No questions/concerns about plan or treatment.    Emotional Assessment Appearance:  Appears stated age Attitude/Demeanor/Rapport:  Unable to Assess Affect (typically observed):  Unable to Assess Orientation:  Oriented to Self Alcohol / Substance use:  Not Applicable Psych involvement (Current and /or in the community):  No (Comment)  Discharge Needs  Concerns to be addressed:  Care Coordination Readmission within the last 30 days:  No Current discharge risk:  Cognitively Impaired, Dependent with Mobility Barriers to Discharge:  Continued Medical Work up   [employer], [place] 10/06/2015, 11:21 AM

## 2015-10-06 NOTE — Evaluation (Addendum)
Clinical/Bedside Swallow Evaluation Patient Details  Name: John [name] MRN: [id number] Date of Birth: March 01, 1922  Today's Date: 10/06/2015 Time: SLP Start Time (ACUTE ONLY): 1027 SLP Stop Time (ACUTE ONLY): 1048 SLP Time Calculation (min) (ACUTE ONLY): 21 min  Past Medical History:  Past Medical History:  Diagnosis Date  . AICD (automatic cardioverter/defibrillator) present   . Arthritis   . Atrial fibrillation (HCC)    Permanent  . BPH (benign prostatic hyperplasia)   . CAD (coronary artery disease)    s/p CABG; Failed LAD PCI 2010  . Carotid artery occlusion   . CHF (congestive heart failure) (HCC)   . Decreased hearing   . Dementia   . DM2 (diabetes mellitus, type 2) (HCC)   . Dysrhythmia    hx atrial fib ,tachycardia  . Gilbert syndrome   . Headache   . HLD (hyperlipidemia)   . HTN (hypertension)   . Memory loss   . Presence of permanent cardiac pacemaker   . Stroke [facility]) ?10  . Subdural hematoma (HCC) 11/05   Precluding Coumadin  . Ventricular tachycardia (HCC)    Syncope-2010 treated with amiodarone   Past Surgical History:  Past Surgical History:  Procedure Laterality Date  . AMPUTATION Left 06/28/2015   Procedure: AMPUTATION SECOND LEFT TOE;  Surgeon: Vivi Barrack, DPM;  Location: [facility] OR;  Service: Podiatry;  Laterality: Left;  . BRAIN SURGERY  05   subdural hematoma  . CORONARY ARTERY BYPASS GRAFT  1998  . HEMORRHOID SURGERY    . PACEMAKER INSERTION  ?10   [employer]  . PERIPHERAL VASCULAR CATHETERIZATION N/A 05/17/2015   Procedure: Abdominal Aortogram w/Lower Extremity;  Surgeon: Chuck Hint, MD;  Location: [facility] INVASIVE CV LAB;  Service: Cardiovascular;  Laterality: N/A;  . TOE AMPUTATION Left 06/28/2015   2nd toe   HPI:  John [name] is a demented 82 y.o. male with medical history significant for recent cellulitis of the left leg hypertension, A. fib, CAD, pacemaker vascular dementia CHF presents to the emergency department chief  complaint worsening altered mental status. Initial evaluation reveals mild dehydration acute encephalopathy and cellulitis of the left leg.   Assessment / Plan / Recommendation Clinical Impression  Pt demonstrates AMS resulting in decreased awareness of PO. Moderate verbal, visual and tactile cues needed to arouse pt and elicit his participation in self feeding. No signs of aspiration observed when pt attending to PO. Given briefly sustained arousal will not attempt regular solids at this time, particularly given that family reports he sometimes falls asleep with food in his mouth at baseline. Discussed appropriate feeding methods with pts wife and son and Charity fundraiser. Demonstrated appropriate position in bed with wife. SLP will follow for advancement from Dys 1/thin diet.     Aspiration Risk  Moderate aspiration risk    Diet Recommendation Dysphagia 1 (Puree);Thin liquid   Liquid Administration via: Cup;Straw Medication Administration: Whole meds with puree Supervision: Staff to assist with self feeding;Full supervision/cueing for compensatory strategies Compensations: Slow rate;Small sips/bites Postural Changes: Seated upright at 90 degrees    Other  Recommendations Oral Care Recommendations: Oral care BID   Follow up Recommendations 24 hour supervision/assistance      Frequency and Duration min 2x/week  2 weeks       Prognosis Prognosis for Safe Diet Advancement: Good Barriers to Reach Goals: Cognitive deficits      Swallow Study   General HPI: John [name] is a demented 82 y.o. male with medical history significant for  recent cellulitis of the left leg hypertension, A. fib, CAD, pacemaker vascular dementia CHF presents to the emergency department chief complaint worsening altered mental status. Initial evaluation reveals mild dehydration acute encephalopathy and cellulitis of the left leg. Type of Study: Bedside Swallow Evaluation Diet Prior to this Study: NPO Temperature Spikes  Noted: No Respiratory Status: Nasal cannula History of Recent Intubation: No Behavior/Cognition: Lethargic/Drowsy Oral Cavity Assessment: Dry Oral Care Completed by SLP: Yes Oral Cavity - Dentition: Adequate natural dentition Vision: Functional for self-feeding Self-Feeding Abilities: Needs assist;Able to feed self Patient Positioning: Upright in bed Baseline Vocal Quality: Normal Volitional Cough: Strong Volitional Swallow: Able to elicit    Oral/Motor/Sensory Function Overall Oral Motor/Sensory Function: Within functional limits   Ice Chips Ice chips: Within functional limits   Thin Liquid Thin Liquid: Within functional limits Presentation: Cup;Straw    Nectar Thick Nectar Thick Liquid: Not tested   Honey Thick Honey Thick Liquid: Not tested   Puree Puree: Within functional limits Presentation: Spoon   Solid   GO   Solid: Not tested    Functional Assessment Tool Used: clinical judgement Functional Limitations: Swallowing Swallow Current Status (U4403): At least 1 percent but less than 20 percent impaired, limited or restricted Swallow Goal Status (K7425): 0 percent impaired, limited or restricted Swallow Discharge Status [phone]): 0 percent impaired, limited or restricted  Harlon Ditty, MA CCC-SLP [phone]  Alajiah Dutkiewicz, Riley Nearing 10/06/2015,10:53 AM

## 2015-10-06 NOTE — Plan of Care (Signed)
Problem: Education: Goal: Knowledge of [facility] Education information/materials will improve Outcome: Not Progressing Pt confused   

## 2015-10-06 NOTE — Progress Notes (Signed)
7 beat run of VTAC. Pt now back in a paced rhythm at 71 bpm. Pt sleeping. Dr Vanessa Barbara notified. Will monitor pt.

## 2015-10-06 NOTE — Consult Note (Signed)
Consultation Note Date: 10/06/2015   Patient Name: John [name]  DOB: July 13, 1922  MRN: [id number]  Age / Sex: 82 y.o., male  PCP: Crist Infante, MD Referring Physician: Kelvin Cellar, MD  Reason for Consultation: Establishing goals of care  HPI/Patient Profile: 82 y.o. male  with past medical history of dementia (x10 yrs), CAD s/p CABG and pace maker placement, CVA, and cellulitis of LLE, with osteomyelitis of his left 2nd toe, who was admitted on 10/05/2015 with recurrent cellulitis of LLE, lethargy and another fall.  His CXR and U/A were negative for infection.  He is being treated with antibiotics for the cellulitis.  He was evaluated by speech therapy and placed on a dysphagia 1 diet (pureed) as he falls asleep so quickly - sometimes while chewing a bite.  He has been having runs of non sustained Vtach on tele.  Clinical Assessment and Goals of Care: I met with the patient his wife and son John [name]) at bedside.  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  He has 2 sons and 5 grand dtrs.  He currently lives with his 67 yr old wife in [place] [facility].  He recently started with [facility].  His wife and son describe a steep decline in the last 6 months.  Prior to admission he could only walk a few steps before he needed to sit down and rest.  He has symptoms of neuropathy (pins and needles) in his lower extremities and it is hard for him to stand.  He frequently falls asleep - even in the middle of a sentence or in the middle of a bite of food.  He has frequent falls.  During a recent fall his wife tried to catch him and ended up being hurt herself (she received 7 stitches on her jaw).  Per his wife he has had significant weight loss.  HCPOA:  Wife and sons.    SUMMARY OF RECOMMENDATIONS    Family is very reasonable in their thinking.  They  realize he is close to end of life and want him cared for appropriately.  They are going to see if he is able to walk/eat in the next few days after being treated with antibiotics.   Patient will either need a higher level of care at the facility or if he does not improve he may be appropriate for residential hospice.  Family John [name]) is in the data collection stage and would like information on residential hospice.  They are currently with [facility] who does not offer residential hospice.  I have put in a consult to social work for residential hospice information.  PMT will continue to follow inpatient.  Code Status/Advance Care Planning:  DNR    Symptom Management:   Considered something for peripheral neuropathy as well as something for excessive sleepiness - but opted against both as they may do more harm than good.  Home med list could be trimmed - multi vitamin, pravastatin are candidates  to evaluate for d/c  Palliative Prophylaxis:   Aspiration and Oral Care  Additional Recommendations (Limitations, Scope, Preferences):  Minimize Medications  Prognosis:   < 6 months given advanced dementia, recurrent infection, decreased PO intake, decreased ambulation.  Discharge Planning: To Be Determined ([facility] with Hospice vs SNF vs Residential Hospice)      Primary Diagnoses: Present on Admission: . Acute encephalopathy . Atrial fibrillation ([place]) . CAD (coronary artery disease) . Essential hypertension . Vascular dementia, uncomplicated . Altered mental status . Cellulitis . Dehydration   I have reviewed the medical record, interviewed the patient and family, and examined the patient. The following aspects are pertinent.  Past Medical History:  Diagnosis Date  . AICD (automatic cardioverter/defibrillator) present   . Arthritis   . Atrial fibrillation ([place])    Permanent  . BPH (benign prostatic hyperplasia)   . CAD (coronary artery disease)    s/p CABG;  Failed LAD PCI 2010  . Carotid artery occlusion   . CHF (congestive heart failure) ([place])   . Decreased hearing   . Dementia   . DM2 (diabetes mellitus, type 2) ([place])   . Dysrhythmia    hx atrial fib ,tachycardia  . Gilbert syndrome   . Headache   . HLD (hyperlipidemia)   . HTN (hypertension)   . Memory loss   . Presence of permanent cardiac pacemaker   . Stroke [facility]) ?10  . Subdural hematoma ([place]) 11/05   Precluding Coumadin  . Ventricular tachycardia ([place])    Syncope-2010 treated with amiodarone   Social History   Social History  . Marital status: Married    Spouse name: N/A  . Number of children: N/A  . Years of education: N/A   Occupational History  . retired principal    Social History Main Topics  . Smoking status: Never Smoker  . Smokeless tobacco: Never Used  . Alcohol use No  . Drug use: No  . Sexual activity: Not Asked   Other Topics Concern  . None   Social History Narrative  . None   Family History  Problem Relation Age of Onset  . Coronary artery disease Mother   . Cancer Father    Scheduled Meds: . acidophilus  2 capsule Oral Daily  . amiodarone  200 mg Oral Once per day on Sun Mon Wed Thu Fri  . aspirin EC  81 mg Oral QHS  . carvedilol  6.25 mg Oral BID WC  . cholecalciferol  1,000 Units Oral Daily  . enoxaparin (LOVENOX) injection  55 mg Subcutaneous Q24H  . escitalopram  10 mg Oral Daily  . insulin aspart  0-9 Units Subcutaneous Q4H  . mouth rinse  15 mL Mouth Rinse BID  . memantine  28 mg Oral Daily  . multivitamin  1 tablet Oral BID  . pravastatin  40 mg Oral QHS  . triamcinolone cream   Topical BID  . [START ON 10/07/2015] vancomycin  1,250 mg Intravenous Q24H   Continuous Infusions: . sodium chloride 75 mL/hr at 10/06/15 1126   PRN Meds:.acetaminophen **OR** acetaminophen, ondansetron **OR** ondansetron (ZOFRAN) IV Medications Prior to Admission:  Prior to Admission medications   Medication Sig Start Date End Date Taking?  Authorizing Provider  acidophilus (RISAQUAD) CAPS capsule Take 2 capsules by mouth daily. 07/01/15  Yes Albertine Patricia, MD  amiodarone (PACERONE) 200 MG tablet Take 200 mg by mouth See admin instructions. Pt only takes on Sunday, Monday, Wednesday, Thursday, and Friday.   Yes Historical  Provider, MD  aspirin EC 81 MG tablet Take 81 mg by mouth at bedtime.   Yes Historical Provider, MD  carvedilol (COREG) 6.25 MG tablet Take 1 tablet (6.25 mg total) by mouth 2 (two) times daily with a meal. 03/03/15  Yes Deboraha Sprang, MD  cholecalciferol (VITAMIN D) 1000 units tablet Take 1,000 Units by mouth daily.   Yes Historical Provider, MD  escitalopram (LEXAPRO) 10 MG tablet Take 10 mg by mouth daily.   Yes Historical Provider, MD  meclizine (ANTIVERT) 25 MG tablet Take 12.5-25 mg by mouth 4 (four) times daily as needed for dizziness. 09/22/15  Yes Historical Provider, MD  Multiple Vitamins-Minerals (PRESERVISION AREDS 2) CAPS Take 1 capsule by mouth 2 (two) times daily.   Yes Historical Provider, MD  NAMENDA XR 28 MG CP24 24 hr capsule Take 28 mg by mouth daily.    Yes Historical Provider, MD  pravastatin (PRAVACHOL) 40 MG tablet Take 40 mg by mouth at bedtime.    Yes Historical Provider, MD  rivastigmine (EXELON) 9.5 mg/24hr Place 1 patch onto the skin at bedtime.    Yes Historical Provider, MD  triamcinolone cream (KENALOG) 0.1 % Apply 1 application topically 2 (two) times daily as needed. legs 09/03/15  Yes Historical Provider, MD   Allergies  Allergen Reactions  . Metronidazole     Gi upsett   Review of Systems:  Patient sleeping   Physical Exam  Elderly gentleman, sleeping, NAD. He sleeps thru our conversation. CV rrr Resp NAD Abdomen soft LLE bright pink and warm to touch  Vital Signs: BP 124/74 (BP Location: Left Arm)   Pulse 70   Temp 97.3 F (36.3 C) (Oral)   Resp 18   Ht 5' 11"  (1.803 m)   Wt 106.4 kg (234 lb 9.6 oz)   SpO2 97%   BMI 32.72 kg/m  Pain Assessment: PAINAD     Pain Score: 0-No pain   SpO2: SpO2: 97 % O2 Device:SpO2: 97 % O2 Flow Rate: .O2 Flow Rate (L/min): 2 L/min  IO: Intake/output summary:  Intake/Output Summary (Last 24 hours) at 10/06/15 1500 Last data filed at 10/06/15 0910  Gross per 24 hour  Intake             1275 ml  Output              200 ml  Net             1075 ml    LBM:   Baseline Weight: Weight: 110.2 kg (243 lb) Most recent weight: Weight: 106.4 kg (234 lb 9.6 oz)     Palliative Assessment/Data:   Flowsheet Rows   Flowsheet Row Most Recent Value  Intake Tab  Referral Department  Hospitalist  Unit at Time of Referral  Med/Surg Unit  Palliative Care Primary Diagnosis  Other (Comment)  Date Notified  10/05/15  Palliative Care Type  New Palliative care  Reason for referral  Non-pain Symptom, Pain, Clarify Goals of Care, Counsel Regarding Hospice  Date of Admission  10/05/15  Date first seen by Palliative Care  10/06/15  # of days Palliative referral response time  1 Day(s)  # of days IP prior to Palliative referral  0  Clinical Assessment  Palliative Performance Scale Score  30%  Psychosocial & Spiritual Assessment  Palliative Care Outcomes  Patient/Family meeting held?  Yes  Who was at the meeting?  patient, wife, son John [name])  [place] goals of care, Counseled regarding hospice  Time In: 2:00  Time Out: 3:15 Time Total: 75 min Greater than 50%  of this time was spent counseling and coordinating care related to the above assessment and plan.  Signed by: Imogene Burn, PA-C Palliative Medicine Pager: [phone]   Please contact Palliative Medicine Team phone at [phone] for questions and concerns.  For individual provider: See Shea Evans

## 2015-10-06 NOTE — Progress Notes (Signed)
PROGRESS NOTE    John [name]  DDU:[id number] DOB: Oct 29, 1922 DOA: 10/05/2015 PCP:  Kayser, MD   Brief Narrative:  John [name] is a 82 year old gentleman with multiple comorbidities including having a of cognitive impairment as well as A. fib, congestive heart failure, status post pacemaker implant, admitted to the medicine service on 10/05/2015 when he presented with a steep functional decline. Family members reporting that he has had a gradual decline over the past 3 months. In the last 24 hours he has become lethargic, minimally arousable, having poor by mouth intake and confusion. He was found to have increased erythema involving his left lower extremity. He has a history of cellulitis and osteomyelitis undergoing amputation of his second toe on left foot. He has been on previous courses of antimicrobial therapy.   Assessment & Plan:   Principal Problem:   Acute encephalopathy Active Problems:   Diabetes (HCC)   Essential hypertension   Atrial fibrillation (HCC)   CAD (coronary artery disease)   Vascular dementia, uncomplicated   Cellulitis   Altered mental status   Dehydration  1.  Functional decline/failure to thrive -John [name] having a significant decline in the past 24 hours, becoming lethargic with minimal by mouth intake, week and confused. -He has a history of cognitive impairment and had been at an assisted living facility. -Family members report that he has had a progressive decline over the past several months -Steep decline in the last 24 hours may have been related to development of left lower extremity cellulitis, as on physical examination he was found to have erythema, localized warmth and swelling of left leg. -Started empiric IV antibiotic therapy with vancomycin. Will also provide IV fluids.   2.  Cellulitis -He has a history of cellulitis along with osteomyelitis which led to amputation of second toe of left foot. -Family members reporting that symptoms  significantly improved while on antibiotics, however swelling and erythema had come back since their discontinuation -Plan to cover with IV vancomycin. On physical examination did not have fluctuant masses or purulent discharge  3.  History of atrial fibrillation -CHADSVasc score of 4 -Currently not anticoagulated, having history of subdural hematoma -Rate controlled -Continue Coreg 6.25 mg by mouth twice a day along with amiodarone 200 mg by mouth daily  4.  Hypertension -Blood pressure stable, continue Coreg 6.25 mg by mouth twice a day  5.  History of chronic systolic congestive heart failure -His last transthoracic echocardiogram was performed 2011 which revealed an ejection fraction of 40-45% -He appears dry and physical examination, providing gentle IV fluid hydration as he is not taking by mouth, monitor volume status closely   DVT prophylaxis: Lovenox Code Status: DO NOT RESUSCITATE Family Communication: I spoke to family members present at bedside Disposition Plan: Continue IV fluids and IV antibiotics  Consultants:     Procedures:     Antimicrobials:   Vancomycin   Subjective: He is confused, disoriented to provide history  Objective: Vitals:   10/05/15 2057 10/06/15 0500 10/06/15 0506 10/06/15 1131  BP: 139/73  (!) 160/77 124/74  Pulse: 71  70   Resp: 18  18   Temp: 97.4 F (36.3 C)  97.3 F (36.3 C)   TempSrc: Oral  Oral   SpO2: 98%  97%   Weight:  106.4 kg (234 lb 9.6 oz)    Height:        Intake/Output Summary (Last 24 hours) at 10/06/15 1422 Last data filed at 10/06/15 0910  Gross per 24  hour  Intake             1275 ml  Output              200 ml  Net             1075 ml   Filed Weights   10/05/15 1241 10/06/15 0500  Weight: 110.2 kg (243 lb) 106.4 kg (234 lb 9.6 oz)    Examination:  General exam: Encephalopathic, chronically ill-appearing, unable to participate in the plan of care Respiratory system: Clear to auscultation.  Respiratory effort normal. Cardiovascular system: S1 & S2 heard, irregular rate and rhythm. No JVD, murmurs, rubs, gallops or clicks. No pedal edema. Gastrointestinal system: Abdomen is nondistended, soft and nontender. No organomegaly or masses felt. Normal bowel sounds heard. Central nervous system:  No focal neurological deficits. Extremities: Symmetric 5 x 5 power. Skin: There is erythema, localized warmth, that involves left extremity extending from ankle to below the knee, did not appreciate fluctuance or purulent discharge Psychiatry: Confused, disoriented, lethargic    Data Reviewed: I have personally reviewed following labs and imaging studies  CBC:  Recent Labs Lab 10/05/15 1245 10/06/15 0514  WBC 5.1 5.5  HGB 11.2* 12.1*  HCT 35.8* 39.0  MCV 101.1* 101.6*  PLT 163 148*   Basic Metabolic Panel:  Recent Labs Lab 10/05/15 1245 10/06/15 0514  NA 141 143  K 4.1 3.8  CL 110 106  CO2 24 25  GLUCOSE 114* 125*  BUN 11 10  CREATININE 1.11 0.98  CALCIUM 8.8* 9.2  MG  --  1.9   GFR: Estimated Creatinine Clearance: 60.9 mL/min (by C-G formula based on SCr of 0.98 mg/dL). Liver Function Tests:  Recent Labs Lab 10/05/15 1245  AST 63*  ALT 38  ALKPHOS 131*  BILITOT 2.1*  PROT 6.6  ALBUMIN 3.1*   No results for input(s): LIPASE, AMYLASE in the last 168 hours.  Recent Labs Lab 10/05/15 1923  AMMONIA 20   Coagulation Profile: No results for input(s): INR, PROTIME in the last 168 hours. Cardiac Enzymes: No results for input(s): CKTOTAL, CKMB, CKMBINDEX, TROPONINI in the last 168 hours. BNP (last 3 results) No results for input(s): PROBNP in the last 8760 hours. HbA1C:  Recent Labs  10/05/15 1245  HGBA1C 5.8*   CBG:  Recent Labs Lab 10/05/15 2057 10/06/15 0005 10/06/15 0356 10/06/15 0815 10/06/15 1210  GLUCAP 113* 114* 99 105* 105*   Lipid Profile: No results for input(s): CHOL, HDL, LDLCALC, TRIG, CHOLHDL, LDLDIRECT in the last 72  hours. Thyroid Function Tests: No results for input(s): TSH, T4TOTAL, FREET4, T3FREE, THYROIDAB in the last 72 hours. Anemia Panel:  Recent Labs  10/05/15 1923  VITAMINB12 869   Sepsis Labs: No results for input(s): PROCALCITON, LATICACIDVEN in the last 168 hours.  No results found for this or any previous visit (from the past 240 hour(s)).       Radiology Studies: Dg Chest 2 View  Result Date: 10/05/2015 CLINICAL DATA:  Weakness and decreased appetite. Status post fall. Altered mental status. EXAM: CHEST  2 VIEW COMPARISON:  Chest radiograph 04/30/2013 FINDINGS: Left chest wall biventricular pacemaker/ AICD with leads in expected location overlying the left and right ventricles. Median sternotomy wires and CABG markers are unchanged. There is aortic atherosclerosis. Cardiomediastinal contours are unchanged. No pneumothorax or sizable pleural effusion. No focal airspace consolidation. Mild pulmonary vascular congestion without overt pulmonary edema. Multilevel thoracic vertebral body osteophytosis and height loss. IMPRESSION: 1. No radiographic evidence of  pneumonia. 2. Central pulmonary vascular congestion without overt pulmonary edema. 3. Aortic atherosclerosis. Electronically Signed   By: Deatra Robinson M.D.   On: 10/05/2015 13:57   Ct Head Wo Contrast  Result Date: 10/05/2015 CLINICAL DATA:  Altered mental status EXAM: CT HEAD WITHOUT CONTRAST TECHNIQUE: Contiguous axial images were obtained from the base of the skull through the vertex without intravenous contrast. COMPARISON:  08/28/2015 FINDINGS: Brain: No evidence of acute infarction, hemorrhage, hydrocephalus, extra-axial collection or mass lesion/mass effect. Asymmetric right cerebral atrophy with infarct related gliosis and encephalomalacia in all vascular territories. Small-vessel ischemic changes in the cerebral white matter and basal ganglia. Vascular: Diffuse atherosclerotic calcification. Chronic collapsed appearance of the  right cervical ICA. No right ICA flow void on 09/26/2015 brain MRI. Skull: No acute finding.  Remote left parietal craniotomy. Sinuses/Orbits: Bilateral cataract resection. Mild chronic sinusitis. No acute finding. IMPRESSION: 1. No acute finding. 2. Extensive chronic ischemic injury, especially in the right cerebral hemisphere in the setting of chronic right ICA occlusion. Electronically Signed   By: Marnee Spring M.D.   On: 10/05/2015 14:12        Scheduled Meds: . acidophilus  2 capsule Oral Daily  . amiodarone  200 mg Oral Once per day on Sun Mon Wed Thu Fri  . aspirin EC  81 mg Oral QHS  . carvedilol  6.25 mg Oral BID WC  . cholecalciferol  1,000 Units Oral Daily  . enoxaparin (LOVENOX) injection  55 mg Subcutaneous Q24H  . escitalopram  10 mg Oral Daily  . insulin aspart  0-9 Units Subcutaneous Q4H  . mouth rinse  15 mL Mouth Rinse BID  . memantine  28 mg Oral Daily  . multivitamin  1 tablet Oral BID  . pravastatin  40 mg Oral QHS  . triamcinolone cream   Topical BID  . vancomycin  2,000 mg Intravenous Once   Followed by  . [START ON 10/07/2015] vancomycin  1,250 mg Intravenous Q24H   Continuous Infusions: . sodium chloride 75 mL/hr at 10/06/15 1126     LOS: 0 days    Time spent: 35 min    Jeralyn Bennett, MD [facility] Pager [phone]  If 7PM-7AM, please contact night-coverage [email or web address] Password [email or web address] 10/06/2015, 2:22 PM

## 2015-10-06 NOTE — Progress Notes (Signed)
Pharmacy Antibiotic Note  John [name] is a 82 y.o. male admitted on 10/05/2015 with cellulitis. SCr is 0.98. WBC are WNL and patient is afebrile. Pharmacy has been consulted for Vancomycin dosing.  Plan: Vancomycin 2 g IV once followed by Vancomycin 1250 mg IV every 24 hours. Goal trough 10-15 mcg/mL. Monitor renal function, cultures/sensitivites, and LOT. Obtain vancomycin trough at steady state.  Height: 5\' 11"  (180.3 cm) Weight: 234 lb 9.6 oz (106.4 kg) IBW/kg (Calculated) : 75.3  Temp (24hrs), Avg:97.4 F (36.3 C), Min:97.3 F (36.3 C), Max:97.6 F (36.4 C)   Recent Labs Lab 10/05/15 1245 10/06/15 0514  WBC 5.1 5.5  CREATININE 1.11 0.98    Estimated Creatinine Clearance: 60.9 mL/min (by C-G formula based on SCr of 0.98 mg/dL).    Allergies  Allergen Reactions  . Metronidazole     Gi upsett    Antimicrobials this admission: 9/26 Doxycycline >> 9/27 9/27 Vancomycin >>  Dose adjustments this admission: N/A  Microbiology results: N/A  Thank you for allowing pharmacy to be a part of this patient's care.  10/27, Student-PharmD 10/06/2015 10:39 AM

## 2015-10-06 NOTE — [place] FL2 (Signed)
[place] MEDICAID FL2 LEVEL OF CARE SCREENING TOOL     IDENTIFICATION  Patient Name: John [name] Birthdate: 11-Nov-1922 Sex: male Admission Date (Current Location): 10/05/2015  [facility] and [place] Number:  Producer, television/film/video and Address:  [facility], [address] [place], [place] [address]      Provider Number: [id number]  Attending Physician Name and Address:  Jeralyn Bennett, MD  Relative Name and Phone Number:  Renae Fickle, son, [phone]    Current Level of Care: Hospital Recommended Level of Care: Skilled Nursing Facility Prior Approval Number:    Date Approved/Denied:   PASRR Number: [id number]  Discharge Plan: SNF    Current Diagnoses: Patient Active Problem List   Diagnosis Date Noted  . Acute encephalopathy 10/05/2015  . Altered mental status 10/05/2015  . Dehydration 10/05/2015  . Cellulitis 06/28/2015  . CHF (congestive heart failure) (HCC)   . Diabetes mellitus with complication (HCC)   . Nonhealing ulcer of left lower extremity (HCC) 06/17/2015  . Acute osteomyelitis of ankle and foot (HCC) 06/09/2015  . Atherosclerosis of artery of extremity with ulceration (HCC) 01/21/2014  . Vascular dementia, uncomplicated 09/18/2012  . Unspecified late effects of cerebrovascular disease 09/18/2012  . Edema 04/05/2010  . [employer] 04/05/2010  . Ventricular tachycardia (HCC)   . Atrial fibrillation (HCC)   . CAD (coronary artery disease)   . Diabetes (HCC) 01/12/2009  . GILBERT'S SYNDROME 01/12/2009  . Essential hypertension 01/12/2009  . CARDIOMYOPATHY, ISCHEMIC 01/12/2009  . VENTRICULAR TACHYCARDIA 01/12/2009  . SICK SINUS/ TACHY-BRADY SYNDROME 01/12/2009    Orientation RESPIRATION BLADDER Height & Weight     Self  O2 (Nasal cannula 2L) Continent Weight: 106.4 kg (234 lb 9.6 oz) Height:  5\' 11"  (180.3 cm)  BEHAVIORAL SYMPTOMS/MOOD NEUROLOGICAL BOWEL NUTRITION STATUS      Continent Diet (Please see DC  Summary)  AMBULATORY STATUS COMMUNICATION OF NEEDS Skin   Extensive Assist Verbally Normal                       Personal Care Assistance Level of Assistance  Bathing, Feeding, Dressing Bathing Assistance: Maximum assistance Feeding assistance: Maximum assistance Dressing Assistance: Maximum assistance     Functional Limitations Info  Sight, Hearing, Speech Sight Info: Impaired Hearing Info: Impaired Speech Info: Adequate    SPECIAL CARE FACTORS FREQUENCY  PT (By licensed PT)     PT Frequency: not yet assessed              Contractures      Additional Factors Info  Code Status, Allergies, Insulin Sliding Scale Code Status Info: DNR Allergies Info: Metronidazole   Insulin Sliding Scale Info: insulin aspart (novoLOG) injection 0-9 Units       Current Medications (10/06/2015):  This is the current hospital active medication list Current Facility-Administered Medications  Medication Dose Route Frequency Provider Last Rate Last Dose  . 0.9 %  sodium chloride infusion   Intravenous Continuous 10/08/2015, MD 75 mL/hr at 10/06/15 1126    . acetaminophen (TYLENOL) tablet 650 mg  650 mg Oral Q6H PRN 10/08/15, NP       Or  . acetaminophen (TYLENOL) suppository 650 mg  650 mg Rectal Q6H PRN Gwenyth Bender, NP      . acidophilus (RISAQUAD) capsule 2 capsule  2 capsule Oral Daily Gwenyth Bender, NP   2 capsule at 10/06/15 1126  . amiodarone (PACERONE) tablet 200 mg  200 mg Oral Once per day on Sun Mon Wed Thu Fri Ozella Rocks, MD      . aspirin EC tablet 81 mg  81 mg Oral QHS Lesle Chris Black, NP      . carvedilol (COREG) tablet 6.25 mg  6.25 mg Oral BID WC Gwenyth Bender, NP   6.25 mg at 10/06/15 1127  . cholecalciferol (VITAMIN D) tablet 1,000 Units  1,000 Units Oral Daily Gwenyth Bender, NP   1,000 Units at 10/06/15 1126  . enoxaparin (LOVENOX) injection 55 mg  55 mg Subcutaneous Q24H Gwenyth Bender, NP   55 mg at 10/05/15 1948  . escitalopram (LEXAPRO) tablet 10 mg   10 mg Oral Daily Gwenyth Bender, NP   10 mg at 10/06/15 1126  . insulin aspart (novoLOG) injection 0-9 Units  0-9 Units Subcutaneous Q4H Leda Gauze, NP   Stopped at 10/06/15 0900  . MEDLINE mouth rinse  15 mL Mouth Rinse BID Ozella Rocks, MD   15 mL at 10/06/15 1127  . memantine (NAMENDA XR) 24 hr capsule 28 mg  28 mg Oral Daily Lesle Chris Black, NP   28 mg at 10/06/15 1126  . multivitamin (PROSIGHT) tablet 1 tablet  1 tablet Oral BID Ozella Rocks, MD   1 tablet at 10/06/15 1126  . ondansetron (ZOFRAN) tablet 4 mg  4 mg Oral Q6H PRN Gwenyth Bender, NP       Or  . ondansetron [facility]) injection 4 mg  4 mg Intravenous Q6H PRN Gwenyth Bender, NP      . pravastatin (PRAVACHOL) tablet 40 mg  40 mg Oral QHS Lesle Chris Black, NP      . triamcinolone cream (KENALOG) 0.1 %   Topical BID Gwenyth Bender, NP         Discharge Medications: Please see discharge summary for a list of discharge medications.  Relevant Imaging Results:  Relevant Lab Results:   Additional Information SSN [id number]  Mearl Latin, LCSWA

## 2015-10-06 NOTE — Progress Notes (Signed)
Pt has 4 beats run of Vtach, asymptomatic, converted to normal Sinus. MD notified.

## 2015-10-07 DIAGNOSIS — Z8673 Personal history of transient ischemic attack (TIA), and cerebral infarction without residual deficits: Secondary | ICD-10-CM | POA: Diagnosis not present

## 2015-10-07 DIAGNOSIS — E860 Dehydration: Secondary | ICD-10-CM | POA: Diagnosis present

## 2015-10-07 DIAGNOSIS — R112 Nausea with vomiting, unspecified: Secondary | ICD-10-CM | POA: Diagnosis not present

## 2015-10-07 DIAGNOSIS — Z89422 Acquired absence of other left toe(s): Secondary | ICD-10-CM | POA: Diagnosis not present

## 2015-10-07 DIAGNOSIS — I482 Chronic atrial fibrillation: Secondary | ICD-10-CM | POA: Diagnosis present

## 2015-10-07 DIAGNOSIS — R4182 Altered mental status, unspecified: Secondary | ICD-10-CM | POA: Diagnosis present

## 2015-10-07 DIAGNOSIS — I872 Venous insufficiency (chronic) (peripheral): Secondary | ICD-10-CM | POA: Diagnosis present

## 2015-10-07 DIAGNOSIS — R1319 Other dysphagia: Secondary | ICD-10-CM | POA: Diagnosis present

## 2015-10-07 DIAGNOSIS — L03114 Cellulitis of left upper limb: Secondary | ICD-10-CM | POA: Diagnosis not present

## 2015-10-07 DIAGNOSIS — G9340 Encephalopathy, unspecified: Secondary | ICD-10-CM | POA: Diagnosis not present

## 2015-10-07 DIAGNOSIS — R627 Adult failure to thrive: Secondary | ICD-10-CM | POA: Diagnosis present

## 2015-10-07 DIAGNOSIS — I110 Hypertensive heart disease with heart failure: Secondary | ICD-10-CM | POA: Diagnosis present

## 2015-10-07 DIAGNOSIS — F0150 Vascular dementia without behavioral disturbance: Secondary | ICD-10-CM | POA: Diagnosis present

## 2015-10-07 DIAGNOSIS — L03116 Cellulitis of left lower limb: Principal | ICD-10-CM

## 2015-10-07 DIAGNOSIS — E785 Hyperlipidemia, unspecified: Secondary | ICD-10-CM | POA: Diagnosis present

## 2015-10-07 DIAGNOSIS — I5022 Chronic systolic (congestive) heart failure: Secondary | ICD-10-CM | POA: Diagnosis present

## 2015-10-07 DIAGNOSIS — IMO0001 Reserved for inherently not codable concepts without codable children: Secondary | ICD-10-CM

## 2015-10-07 DIAGNOSIS — G9349 Other encephalopathy: Secondary | ICD-10-CM | POA: Diagnosis present

## 2015-10-07 DIAGNOSIS — Z66 Do not resuscitate: Secondary | ICD-10-CM | POA: Diagnosis present

## 2015-10-07 DIAGNOSIS — Z515 Encounter for palliative care: Secondary | ICD-10-CM | POA: Diagnosis not present

## 2015-10-07 DIAGNOSIS — Z951 Presence of aortocoronary bypass graft: Secondary | ICD-10-CM | POA: Diagnosis not present

## 2015-10-07 DIAGNOSIS — I2510 Atherosclerotic heart disease of native coronary artery without angina pectoris: Secondary | ICD-10-CM | POA: Diagnosis present

## 2015-10-07 DIAGNOSIS — F0390 Unspecified dementia without behavioral disturbance: Secondary | ICD-10-CM | POA: Diagnosis not present

## 2015-10-07 DIAGNOSIS — H9190 Unspecified hearing loss, unspecified ear: Secondary | ICD-10-CM | POA: Diagnosis present

## 2015-10-07 DIAGNOSIS — K222 Esophageal obstruction: Secondary | ICD-10-CM | POA: Diagnosis present

## 2015-10-07 DIAGNOSIS — E1151 Type 2 diabetes mellitus with diabetic peripheral angiopathy without gangrene: Secondary | ICD-10-CM | POA: Diagnosis present

## 2015-10-07 DIAGNOSIS — R1110 Vomiting, unspecified: Secondary | ICD-10-CM

## 2015-10-07 DIAGNOSIS — K449 Diaphragmatic hernia without obstruction or gangrene: Secondary | ICD-10-CM | POA: Diagnosis present

## 2015-10-07 DIAGNOSIS — I472 Ventricular tachycardia: Secondary | ICD-10-CM | POA: Diagnosis present

## 2015-10-07 DIAGNOSIS — Z95810 Presence of automatic (implantable) cardiac defibrillator: Secondary | ICD-10-CM | POA: Diagnosis not present

## 2015-10-07 LAB — GLUCOSE, CAPILLARY
GLUCOSE-CAPILLARY: 132 mg/dL — AB (ref 65–99)
GLUCOSE-CAPILLARY: 157 mg/dL — AB (ref 65–99)
GLUCOSE-CAPILLARY: 91 mg/dL (ref 65–99)
Glucose-Capillary: 74 mg/dL (ref 65–99)
Glucose-Capillary: 88 mg/dL (ref 65–99)
Glucose-Capillary: 129 mg/dL — ABNORMAL HIGH (ref 65–99)
Glucose-Capillary: 132 mg/dL — ABNORMAL HIGH (ref 65–99)

## 2015-10-07 LAB — CBC
HEMATOCRIT: 37.1 % — AB (ref 39.0–52.0)
Hemoglobin: 11.2 g/dL — ABNORMAL LOW (ref 13.0–17.0)
MCH: 30.9 pg (ref 26.0–34.0)
MCHC: 30.2 g/dL (ref 30.0–36.0)
MCV: 102.5 fL — AB (ref 78.0–100.0)
PLATELETS: 138 10*3/uL — AB (ref 150–400)
RBC: 3.62 MIL/uL — ABNORMAL LOW (ref 4.22–5.81)
RDW: 16.1 % — AB (ref 11.5–15.5)
WBC: 3.5 10*3/uL — AB (ref 4.0–10.5)

## 2015-10-07 LAB — BASIC METABOLIC PANEL
Anion gap: 4 — ABNORMAL LOW (ref 5–15)
BUN: 8 mg/dL (ref 6–20)
CHLORIDE: 108 mmol/L (ref 101–111)
CO2: 28 mmol/L (ref 22–32)
CREATININE: 0.85 mg/dL (ref 0.61–1.24)
Calcium: 8.7 mg/dL — ABNORMAL LOW (ref 8.9–10.3)
GFR calc Af Amer: >60 mL/min (ref >60)
GFR calc non Af Amer: >60 mL/min (ref >60)
GLUCOSE: 89 mg/dL (ref 65–99)
POTASSIUM: 3.5 mmol/L (ref 3.5–5.1)
SODIUM: 140 mmol/L (ref 135–145)

## 2015-10-07 NOTE — Evaluation (Signed)
Physical Therapy Evaluation Patient Details Name: John [name] MRN: [id number] DOB: 10-Mar-1922 Today's Date: 10/07/2015   History of Present Illness  John [name] is a demented 82 y.o. male with medical history significant for recent cellulitis of the left leg hypertension, A. fib, CAD, pacemaker vascular dementia CHF presents to the emergency department chief complaint worsening altered mental status. Initial evaluation reveals mild dehydration acute encephalopathy and cellulitis of the left leg.  Clinical Impression  Pt admitted with/for AMS and cellulitis of Left leg.  Pt currently limited functionally due to the problems listed below.  (see problems list.)  Pt will benefit from PT to maximize function and safety to be able to get home safely with available assist of family and PCA.     Follow Up Recommendations Other (comment);No PT follow up (hospice services)    Equipment Recommendations  None recommended by PT    Recommendations for Other Services       Precautions / Restrictions Precautions Precautions: Fall      Mobility  Bed Mobility Overal bed mobility: Needs Assistance Bed Mobility: Supine to Sit     Supine to sit: Mod assist     General bed mobility comments: cues for step by step guidance and assist to come up and forward to EOB  Transfers Overall transfer level: Needs assistance   Transfers: Sit to/from Stand Sit to Stand: Mod assist         General transfer comment: cues to get to EOB, hand placement  Ambulation/Gait Ambulation/Gait assistance: Min assist Ambulation Distance (Feet): 120 Feet Assistive device: Rolling walker (2 wheeled) Gait Pattern/deviations: Step-through pattern   Gait velocity interpretation: Below normal speed for age/gender General Gait Details: generally steady gait with need to guide the RW and cue pt to stay up inside the RW.  Stairs            Wheelchair Mobility    Modified Rankin (Stroke Patients Only)       Balance Overall balance assessment: Needs assistance Sitting-balance support: No upper extremity supported Sitting balance-Leahy Scale: Fair     Standing balance support: No upper extremity supported;Bilateral upper extremity supported Standing balance-Leahy Scale: Poor Standing balance comment: reliant on outside support                             Pertinent Vitals/Pain Pain Assessment: No/denies pain    Home Living Family/patient expects to be discharged to:: Other (Comment) (would like to get back to [facility])                      Prior Function Level of Independence: Needs assistance   Gait / Transfers Assistance Needed: walked with some stand by assist wtih rollator           Hand Dominance   Dominant Hand: Right    Extremity/Trunk Assessment   Upper Extremity Assessment: Overall WFL for tasks assessed           Lower Extremity Assessment: Overall WFL for tasks assessed;Generalized weakness         Communication   Communication: HOH  Cognition Arousal/Alertness: Awake/alert Behavior During Therapy: WFL for tasks assessed/performed Overall Cognitive Status: History of cognitive impairments - at baseline                      General Comments      Exercises     Assessment/Plan  PT Assessment Patient needs continued PT services  PT Problem List Decreased strength;Decreased activity tolerance;Decreased mobility;Decreased cognition;Decreased knowledge of use of DME;Cardiopulmonary status limiting activity          PT Treatment Interventions DME instruction;Gait training;Functional mobility training;Therapeutic activities;Balance training;Patient/family education    PT Goals (Current goals can be found in the Care Plan section)  Acute Rehab PT Goals PT Goal Formulation: Patient unable to participate in goal setting Time For Goal Achievement: 10/14/15 Potential to Achieve Goals: Fair    Frequency  Min 3X/week   Barriers to discharge        Co-evaluation               End of Session   Activity Tolerance: Patient tolerated treatment well Patient left: in chair;with call bell/phone within reach;with chair alarm set;with family/visitor present Nurse Communication: Mobility status    Functional Assessment Tool Used: clinical judgement Functional Limitation: Mobility: Walking and moving around Mobility: Walking and Moving Around Current Status (O0355): At least 20 percent but less than 40 percent impaired, limited or restricted Mobility: Walking and Moving Around Goal Status [phone]): At least 1 percent but less than 20 percent impaired, limited or restricted    Time: 3845-3646 PT Time Calculation (min) (ACUTE ONLY): 40 min   Charges:   PT Evaluation $PT Eval Moderate Complexity: 1 Procedure PT Treatments $Gait Training: 8-22 mins $Therapeutic Activity: 8-22 mins   PT G Codes:   PT G-Codes **NOT FOR INPATIENT CLASS** Functional Assessment Tool Used: clinical judgement Functional Limitation: Mobility: Walking and moving around Mobility: Walking and Moving Around Current Status (O0321): At least 20 percent but less than 40 percent impaired, limited or restricted Mobility: Walking and Moving Around Goal Status [phone]): At least 1 percent but less than 20 percent impaired, limited or restricted    Maycee Blasco, Eliseo Gum 10/07/2015, 11:47 AM 10/07/2015  Clifton Bing, PT [phone]  (pager)

## 2015-10-07 NOTE — Care Management Obs Status (Signed)
MEDICARE OBSERVATION STATUS NOTIFICATION   Patient Details  Name: John [name] MRN: [id number] Date of Birth: June 30, 1922   Medicare Observation Status Notification Given:  Yes (Unable to sign due to dementia)    Isaias Cowman, RN 10/07/2015, 2:21 PM

## 2015-10-07 NOTE — Progress Notes (Signed)
PROGRESS NOTE    John [name]  John [name]:[id number] DOB: 1922/12/25 DOA: 10/05/2015 PCP:  Kayser, MD   Brief Narrative:  John [name] is a 82 year old gentleman with multiple comorbidities including having a of cognitive impairment as well as A. fib, congestive heart failure, status post pacemaker implant, admitted to the medicine service on 10/05/2015 when he presented with a steep functional decline. Family members reporting that he has had a gradual decline over the past 3 months. In the last 24 hours he has become lethargic, minimally arousable, having poor by mouth intake and confusion. He was found to have increased erythema involving his left lower extremity. He has a history of cellulitis and osteomyelitis undergoing amputation of his second toe on left foot. He has been on previous courses of antimicrobial therapy.   Assessment & Plan:   Principal Problem:   Acute encephalopathy Active Problems:   Diabetes (HCC)   Essential hypertension   Atrial fibrillation (HCC)   CAD (coronary artery disease)   Vascular dementia, uncomplicated   Cellulitis   Altered mental status   Dehydration   Dementia without behavioral disturbance   Palliative care encounter   Goals of care, counseling/discussion   Encounter for hospice care discussion   Regurgitation  1.  Functional decline/failure to thrive -John [name] having a significant decline in the past 24 hours, becoming lethargic with minimal by mouth intake, week and confused. -He has a history of cognitive impairment and had been at an assisted living facility. -Family members report that he has had a progressive decline over the past several months -Steep decline in the last 24 hours may have been related to development of left lower extremity cellulitis, as on physical examination he was found to have erythema, localized warmth and swelling of left leg. -Started empiric IV antibiotic therapy with vancomycin. Will also provide IV fluids.    -On 09/29/2015 patient showing clinical improvement. He was assisted out of bed and ambulated down the hallway with physical therapy  2.  Cellulitis -He has a history of cellulitis along with osteomyelitis which led to amputation of second toe of left foot. -Family members reporting that symptoms significantly improved while on antibiotics, however swelling and erythema had come back since their discontinuation -Plan to cover with IV vancomycin. On physical examination did not have fluctuant masses or purulent discharge -On 09/29/2015 there is improvement to cellulitis on physical examination plan to continue IV vancomycin  3.  History of atrial fibrillation -CHADSVasc score of 4 -Currently not anticoagulated, having history of subdural hematoma -Rate controlled -Continue Coreg 6.25 mg by mouth twice a day along with amiodarone 200 mg by mouth daily  4.  Hypertension -Blood pressure stable, continue Coreg 6.25 mg by mouth twice a day  5.  History of chronic systolic congestive heart failure -His last transthoracic echocardiogram was performed 2011 which revealed an ejection fraction of 40-45% -He appears dry and physical examination, providing gentle IV fluid hydration as he is not taking by mouth, monitor volume status closely  6.  Dysphagia -On his swallowing evaluation today he was noted to have coughing and emesis after a trial of by mouth. -SLP feeling that there is likely esophageal dysphagia -Barium swallow has been ordered by Algis Downs PA. Will follow.  DVT prophylaxis: Lovenox Code Status: DO NOT RESUSCITATE Family Communication: I spoke to family members present at bedside Disposition Plan: Continue IV fluids and IV antibiotics  Consultants:  Palliative care 0 therapyPT   Procedures:     Antimicrobials:  Vancomycin   Subjective: He seems better today, was assisted out of bed, ambulated down the hallway  Objective: Vitals:   10/06/15 2138 10/07/15  0339 10/07/15 0509 10/07/15 1426  BP: (!) 121/59  122/74 128/66  Pulse: 69  70 70  Resp:    16  Temp:   98.4 F (36.9 C) 98 F (36.7 C)  TempSrc:   Oral Oral  SpO2: 98%  100% 98%  Weight:  107.1 kg (236 lb 3.2 oz)    Height:        Intake/Output Summary (Last 24 hours) at 10/07/15 1648 Last data filed at 10/07/15 1620  Gross per 24 hour  Intake              120 ml  Output              425 ml  Net             -305 ml   Filed Weights   10/05/15 1241 10/06/15 0500 10/07/15 0339  Weight: 110.2 kg (243 lb) 106.4 kg (234 lb 9.6 oz) 107.1 kg (236 lb 3.2 oz)    Examination:  General exam: Looks better, more arousable Respiratory system: Clear to auscultation. Respiratory effort normal. Cardiovascular system: S1 & S2 heard, irregular rate and rhythm. No JVD, murmurs, rubs, gallops or clicks. No pedal edema. Gastrointestinal system: Abdomen is nondistended, soft and nontender. No organomegaly or masses felt. Normal bowel sounds heard. Central nervous system:  No focal neurological deficits. Extremities: Symmetric 5 x 5 power. Skin: Interim improvement to erythema Psychiatry: More awake and alert, less lethargic    Data Reviewed: I have personally reviewed following labs and imaging studies  CBC:  Recent Labs Lab 10/05/15 1245 10/06/15 0514 10/07/15 0450  WBC 5.1 5.5 3.5*  HGB 11.2* 12.1* 11.2*  HCT 35.8* 39.0 37.1*  MCV 101.1* 101.6* 102.5*  PLT 163 148* 138*   Basic Metabolic Panel:  Recent Labs Lab 10/05/15 1245 10/06/15 0514 10/07/15 0450  NA 141 143 140  K 4.1 3.8 3.5  CL 110 106 108  CO2 24 25 28   GLUCOSE 114* 125* 89  BUN 11 10 8   CREATININE 1.11 0.98 0.85  CALCIUM 8.8* 9.2 8.7*  MG  --  1.9  --    GFR: Estimated Creatinine Clearance: 70.5 mL/min (by C-G formula based on SCr of 0.85 mg/dL). Liver Function Tests:  Recent Labs Lab 10/05/15 1245  AST 63*  ALT 38  ALKPHOS 131*  BILITOT 2.1*  PROT 6.6  ALBUMIN 3.1*   No results for input(s):  LIPASE, AMYLASE in the last 168 hours.  Recent Labs Lab 10/05/15 1923  AMMONIA 20   Coagulation Profile: No results for input(s): INR, PROTIME in the last 168 hours. Cardiac Enzymes: No results for input(s): CKTOTAL, CKMB, CKMBINDEX, TROPONINI in the last 168 hours. BNP (last 3 results) No results for input(s): PROBNP in the last 8760 hours. HbA1C:  Recent Labs  10/05/15 1245  HGBA1C 5.8*   CBG:  Recent Labs Lab 10/06/15 2358 10/07/15 0402 10/07/15 0458 10/07/15 0847 10/07/15 1159  GLUCAP 137* 74 88 91 129*   Lipid Profile: No results for input(s): CHOL, HDL, LDLCALC, TRIG, CHOLHDL, LDLDIRECT in the last 72 hours. Thyroid Function Tests: No results for input(s): TSH, T4TOTAL, FREET4, T3FREE, THYROIDAB in the last 72 hours. Anemia Panel:  Recent Labs  10/05/15 1923  VITAMINB12 869   Sepsis Labs: No results for input(s): PROCALCITON, LATICACIDVEN in the last 168 hours.  No results  found for this or any previous visit (from the past 240 hour(s)).       Radiology Studies: No results found.      Scheduled Meds: . acidophilus  2 capsule Oral Daily  . amiodarone  200 mg Oral Once per day on Sun Mon Wed Thu Fri  . aspirin EC  81 mg Oral QHS  . carvedilol  6.25 mg Oral BID WC  . cholecalciferol  1,000 Units Oral Daily  . enoxaparin (LOVENOX) injection  55 mg Subcutaneous Q24H  . escitalopram  10 mg Oral Daily  . insulin aspart  0-9 Units Subcutaneous Q4H  . mouth rinse  15 mL Mouth Rinse BID  . memantine  28 mg Oral Daily  . multivitamin  1 tablet Oral BID  . pravastatin  40 mg Oral QHS  . triamcinolone cream   Topical BID  . vancomycin  1,250 mg Intravenous Q24H   Continuous Infusions: . sodium chloride 75 mL/hr at 10/07/15 0619     LOS: 0 days    Time spent: 35 min    Jeralyn Bennett, MD [facility] Pager [phone]  If 7PM-7AM, please contact night-coverage [email or web address] Password [email or web address] 10/07/2015, 4:48 PM

## 2015-10-07 NOTE — Progress Notes (Signed)
[facility] Liaison Note:  Received a request from [place] CSW to provide information regarding residential hospice. Spoke to son, Reita Cliche and provided detailed information and answered questions re: Psychologist, sport and exercise.  Reita Cliche stated the patient seems to be improving today, however, remains interested in residential hospice placement if he continues to decline over the next few days.  Provided information and left contact information if any needs arise.  Please contact if we can be of further assistance.  Thank you,  Hessie Knows RN, BSN [facility] Liaison [phone]

## 2015-10-07 NOTE — Progress Notes (Signed)
Speech Language Pathology Treatment: Dysphagia  Patient Details Name: John [name] MRN: [id number] DOB: January 26, 1922 Today's Date: 10/07/2015 Time: 0712-1975 SLP Time Calculation (min) (ACUTE ONLY): 26 min  Assessment / Plan / Recommendation Clinical Impression  Pt demonstrates improved arousal today, able to sustain attention to PO, thoroughly masticate and swallow with no immediate signs of aspiration. However, with all trials pt started coughing about 1-3 minutes after intake and in one case vomited. His family reports this happens often. Based on presentation, pt likely has some form of esophageal dysphagia. Pt tolerates liquids and puree much better than solids, but is quite picky per son. Discussed high risk of aspiration and nature of esophageal dysphagia with son. Demonstrated basic esophageal precautions, particularly following solids with liquids. Will attempt to upgrade solids to dys 2/thin will full supervision and check for tolerance.    HPI HPI: John [name] is a demented 82 y.o. male with medical history significant for recent cellulitis of the left leg hypertension, A. fib, CAD, pacemaker vascular dementia CHF presents to the emergency department chief complaint worsening altered mental status. Initial evaluation reveals mild dehydration acute encephalopathy and cellulitis of the left leg.      SLP Plan  Continue with current plan of care     Recommendations  Diet recommendations: Dysphagia 2 (fine chop);Thin liquid Liquids provided via: Cup;Straw Medication Administration: Crushed with puree Supervision: Staff to assist with self feeding;Trained caregiver to feed patient;Full supervision/cueing for compensatory strategies Compensations: Slow rate;Small sips/bites;Follow solids with liquid Postural Changes and/or Swallow Maneuvers: Seated upright 90 degrees                Plan: Continue with current plan of care       GO               [facility], [place]  CCC-SLP [phone]  Claudine Mouton 10/07/2015, 11:48 AM

## 2015-10-07 NOTE — Progress Notes (Signed)
Daily Progress Note   Patient Name: John [name]       Date: 10/07/2015 DOB: 1922-01-13  Age: 82 y.o. MRN#: [id number] Attending Physician: John Bennett, MD Primary Care Physician: John Kayser, MD Admit Date: 10/05/2015  Reason for Consultation/Follow-up: Establishing goals of care  Subjective: Family is evaluating SNFs.  They are going to leave hospice as he is going to d/c to SNF.  Family asks for a GI consult to evaluate swallowing.  He is having frequent regurgitation when he eats.    Length of Stay: 0  Current Medications: Scheduled Meds:  . acidophilus  2 capsule Oral Daily  . amiodarone  200 mg Oral Once per day on Sun Mon Wed Thu Fri  . aspirin EC  81 mg Oral QHS  . carvedilol  6.25 mg Oral BID WC  . cholecalciferol  1,000 Units Oral Daily  . enoxaparin (LOVENOX) injection  55 mg Subcutaneous Q24H  . escitalopram  10 mg Oral Daily  . insulin aspart  0-9 Units Subcutaneous Q4H  . mouth rinse  15 mL Mouth Rinse BID  . memantine  28 mg Oral Daily  . multivitamin  1 tablet Oral BID  . pravastatin  40 mg Oral QHS  . triamcinolone cream   Topical BID  . vancomycin  1,250 mg Intravenous Q24H    Continuous Infusions: . sodium chloride 75 mL/hr at 10/07/15 0619    PRN Meds: acetaminophen **OR** acetaminophen, ondansetron **OR** ondansetron (ZOFRAN) IV  Physical Exam      Well developed elderly male.  Awake, alert, sitting up in the recliner. Resp:  NAD M/S      Vital Signs: BP 128/66 (BP Location: Left Arm)   Pulse 70   Temp 98 F (36.7 C) (Oral)   Resp 16   Ht 5\' 11"  (1.803 m)   Wt 107.1 kg (236 lb 3.2 oz)   SpO2 98%   BMI 32.94 kg/m  SpO2: SpO2: 98 % O2 Device: O2 Device: Not Delivered O2 Flow Rate: O2 Flow Rate (L/min): 2 L/min  Intake/output summary:   Intake/Output Summary (Last 24 hours) at 10/07/15 1538 Last data filed at 10/07/15 10/09/15  Gross per 24 hour  Intake                0 ml  Output  425 ml  Net             -425 ml   LBM:   Baseline Weight: Weight: 110.2 kg (243 lb) Most recent weight: Weight: 107.1 kg (236 lb 3.2 oz)       Palliative Assessment/Data:    Flowsheet Rows   Flowsheet Row Most Recent Value  Intake Tab  Referral Department  Hospitalist  Unit at Time of Referral  Med/Surg Unit  Palliative Care Primary Diagnosis  Other (Comment)  Date Notified  10/05/15  Palliative Care Type  New Palliative care  Reason for referral  Non-pain Symptom, Pain, Clarify Goals of Care, Counsel Regarding Hospice  Date of Admission  10/05/15  Date first seen by Palliative Care  10/06/15  # of days Palliative referral response time  1 Day(s)  # of days IP prior to Palliative referral  0  Clinical Assessment  Palliative Performance Scale Score  30%  Psychosocial & Spiritual Assessment  Palliative Care Outcomes  Patient/Family meeting held?  Yes  Who was at the meeting?  patient, wife, son John [name])  Palliative Care Outcomes  Clarified goals of care, Counseled regarding hospice      Patient Active Problem List   Diagnosis Date Noted  . Dementia without behavioral disturbance   . Palliative care encounter   . Goals of care, counseling/discussion   . Encounter for hospice care discussion   . Acute encephalopathy 10/05/2015  . Altered mental status 10/05/2015  . Dehydration 10/05/2015  . Cellulitis 06/28/2015  . CHF (congestive heart failure) (HCC)   . Diabetes mellitus with complication (HCC)   . Nonhealing ulcer of left lower extremity (HCC) 06/17/2015  . Acute osteomyelitis of ankle and foot (HCC) 06/09/2015  . Atherosclerosis of artery of extremity with ulceration (HCC) 01/21/2014  . Vascular dementia, uncomplicated 09/18/2012  . Unspecified late effects of cerebrovascular disease 09/18/2012  . Edema  04/05/2010  . [employer] 04/05/2010  . Ventricular tachycardia (HCC)   . Atrial fibrillation (HCC)   . CAD (coronary artery disease)   . Diabetes (HCC) 01/12/2009  . GILBERT'S SYNDROME 01/12/2009  . Essential hypertension 01/12/2009  . CARDIOMYOPATHY, ISCHEMIC 01/12/2009  . VENTRICULAR TACHYCARDIA 01/12/2009  . SICK SINUS/ TACHY-BRADY SYNDROME 01/12/2009    Palliative Care Assessment & Plan   Patient Profile: 82 y.o. male  with past medical history of dementia (x10 yrs), CAD s/p CABG and pace maker placement, CVA, and cellulitis of LLE, with osteomyelitis of his left 2nd toe, who was admitted on 10/05/2015 with recurrent cellulitis of LLE, lethargy and another fall.  His CXR and U/A were negative for infection.  He is being treated with antibiotics for the cellulitis.  He was evaluated by speech therapy and placed on a dysphagia 1 diet (pureed) as he falls asleep so quickly - sometimes while chewing a bite.  He has been having runs of non sustained Vtach on tele.  Assessment: Patient's lethargy appears to be lifting.  He walked using a RW with PT today.  He has difficulty with regurgitation after eating.    Recommendations/Plan:  Will order a barium swallow to look for obstruction/stricture. If there is one, it may be considered a comfort measure to have this dilated.  A gastroenterologist can determine this outpatient.  If patient discharges to assisted living would recommend continuing hospice services for additional support.   [facility] has visited with the patient this admission.  John [name] would like to avail himself of John [name] when residential hospice  is appropriate.  If he discharges to SNF please add in the discharge instructions for him to be followed by Palliative Medicine at [facility].  Minimize medications.   Consider eliminating multivitamins etc...  Code Status:  DNR   Prognosis:  < 6 months  given advanced dementia, recurrent infection, decreased PO  intake, decreased ambulation.   Discharge Planning:  ALF with hospice services vs SNF with Palliative services.  Care plan was discussed with family and [facility] attending as well as social work.  Thank you for allowing the Palliative Medicine Team to assist in the care of this patient.   Time In: 3:00 Time Out: 3:35 Total Time 35 min Prolonged Time Billed no      Greater than 50%  of this time was spent counseling and coordinating care related to the above assessment and plan.  Stephani Police, PA-C  Please contact Palliative Medicine Team phone at [phone] for questions and concerns.

## 2015-10-08 ENCOUNTER — Inpatient Hospital Stay (HOSPITAL_COMMUNITY): Payer: Medicare Other

## 2015-10-08 DIAGNOSIS — L03114 Cellulitis of left upper limb: Secondary | ICD-10-CM

## 2015-10-08 LAB — BASIC METABOLIC PANEL
Anion gap: 3 — ABNORMAL LOW (ref 5–15)
BUN: 9 mg/dL (ref 6–20)
CHLORIDE: 109 mmol/L (ref 101–111)
CO2: 28 mmol/L (ref 22–32)
CREATININE: 0.9 mg/dL (ref 0.61–1.24)
Calcium: 8.6 mg/dL — ABNORMAL LOW (ref 8.9–10.3)
Glucose, Bld: 115 mg/dL — ABNORMAL HIGH (ref 65–99)
POTASSIUM: 3.6 mmol/L (ref 3.5–5.1)
SODIUM: 140 mmol/L (ref 135–145)

## 2015-10-08 LAB — CBC
HCT: 35.7 % — ABNORMAL LOW (ref 39.0–52.0)
Hemoglobin: 10.9 g/dL — ABNORMAL LOW (ref 13.0–17.0)
MCH: 31.1 pg (ref 26.0–34.0)
MCHC: 30.5 g/dL (ref 30.0–36.0)
MCV: 101.7 fL — AB (ref 78.0–100.0)
PLATELETS: 162 10*3/uL (ref 150–400)
RBC: 3.51 MIL/uL — AB (ref 4.22–5.81)
RDW: 16.2 % — AB (ref 11.5–15.5)
WBC: 4.5 10*3/uL (ref 4.0–10.5)

## 2015-10-08 LAB — GLUCOSE, CAPILLARY
Glucose-Capillary: 113 mg/dL — ABNORMAL HIGH (ref 65–99)
Glucose-Capillary: 110 mg/dL — ABNORMAL HIGH (ref 65–99)
Glucose-Capillary: 141 mg/dL — ABNORMAL HIGH (ref 65–99)

## 2015-10-08 MED ORDER — CEPHALEXIN 500 MG PO CAPS
500.0000 mg | ORAL_CAPSULE | Freq: Two times a day (BID) | ORAL | Status: DC
  Administered 2015-10-08: 500 mg via ORAL
  Filled 2015-10-08: qty 1

## 2015-10-08 MED ORDER — CEPHALEXIN 500 MG PO CAPS: 500.0000 mg | ORAL_CAPSULE | Freq: Two times a day (BID) | ORAL | 0 refills | Status: AC

## 2015-10-08 NOTE — Progress Notes (Addendum)
Palliative Medicine RN Note: Called [facility] to discuss pt's hospice status. They state pt is active with them but this admission is "s/p fall." Discussed the admitting dx of encephalopathy; rep will talk to her administrator, who will call me back about this admission/whether it is GIP. Hospice status will change how hospice is set up at d/c; may be a transfer rather than new or repeat hospice admit, or may need d/c hospice if d/c to SNF under SNF benefit. [facility] rep stated they are waiting for [facility] to call and tell them what's going on. Family already met with [facility], as they want the option of [facility] at extreme EOL.  For now, PMT RN is waiting on call back from administrator at [employer].  Marjie Skiff Jaysion Ramseyer, RN, BSN, [facility] 10/08/2015 9:21 AM Cell [phone] 8:00-4:00 Monday-Friday Office [phone]

## 2015-10-08 NOTE — Care Management Note (Signed)
Case Management Note  Patient Details  Name: John [name] MRN: [id number] Date of Birth: 28-Mar-1922  Subjective/Objective:       Admitted with acute encephalopathy.             Action/Plan: Plan is to d/c to SNF today.  Expected Discharge Date:    10/08/2015             Expected Discharge Plan:  Skilled Nursing Facility ( Pt is from [employer]. Disposition @ d/c SNF.)  In-House Referral:  Clinical Social Work  Discharge planning Services  CM Consult   Status of Service:  Completed, signed off  If discussed at [employer] [facility] [employer], dates discussed:    Additional Comments:  Epifanio Lesches, RN 10/08/2015, 3:06 PM

## 2015-10-08 NOTE — Progress Notes (Signed)
Physical Therapy Treatment Patient Details Name: John [name] MRN: [id number] DOB: Feb 07, 1922 Today's Date: 10/08/2015    History of Present Illness John [name] is a demented 82 y.o. male with medical history significant for recent cellulitis of the left leg hypertension, A. fib, CAD, pacemaker vascular dementia CHF presents to the emergency department chief complaint worsening altered mental status. Initial evaluation reveals mild dehydration acute encephalopathy and cellulitis of the left leg.    PT Comments    Patient continues to be high falls risk due to weakness and cognitive impairments. Pt pleasant and agreeable to therapy. Family present. Recommending SNF for further skilled PT services to maximize independence and safety with mobility.   Follow Up Recommendations  SNF;Supervision/Assistance - 24 hour     Equipment Recommendations  None recommended by PT    Recommendations for Other Services       Precautions / Restrictions Precautions Precautions: Fall Restrictions Weight Bearing Restrictions: No    Mobility  Bed Mobility               General bed mobility comments: OOB in chair upon arrival   Transfers Overall transfer level: Needs assistance Equipment used: Rolling walker (2 wheeled) Transfers: Sit to/from Stand Sit to Stand: Mod assist         General transfer comment: cues for safe hand placement; assist to power up into standing  Ambulation/Gait Ambulation/Gait assistance: Min assist Ambulation Distance (Feet): 150 Feet Assistive device: Rolling walker (2 wheeled) Gait Pattern/deviations: Step-through pattern;Decreased stride length;Trunk flexed     General Gait Details: cues for posture, proximity of RW, and gait speed; assist for management of RW when negotiating objects   Stairs            Wheelchair Mobility    Modified Rankin (Stroke Patients Only)       Balance     Sitting balance-Leahy Scale: Fair        Standing balance-Leahy Scale: Poor                      Cognition Arousal/Alertness: Awake/alert Behavior During Therapy: WFL for tasks assessed/performed Overall Cognitive Status: History of cognitive impairments - at baseline (oriented to self, not time/place/situation)       Memory: Decreased short-term memory              Exercises      General Comments General comments (skin integrity, edema, etc.): pt fatigued and sleeping end of session      Pertinent Vitals/Pain Pain Assessment: No/denies pain    Home Living                      Prior Function            PT Goals (current goals can now be found in the care plan section) Acute Rehab PT Goals Patient Stated Goal: son and wife would like pt to get stronger Progress towards PT goals: Progressing toward goals    Frequency    Min 3X/week      PT Plan Discharge plan needs to be updated    Co-evaluation             End of Session Equipment Utilized During Treatment: Gait belt Activity Tolerance: Patient limited by fatigue Patient left: in chair;with call bell/phone within reach;with chair alarm set;with family/visitor present     Time: 0093-8182 PT Time Calculation (min) (ACUTE ONLY): 47 min  Charges:  $Gait Training: 8-22 mins $Therapeutic  Activity: 23-37 mins                    G Codes:      Derek Mound, PTA Pager: [phone]   10/08/2015, 2:35 PM

## 2015-10-08 NOTE — Clinical Social Work Placement (Signed)
   CLINICAL SOCIAL WORK PLACEMENT  NOTE 10/08/15 - DISCHARGED TO [facility]  Date:  10/08/2015  Patient Details  Name: John [name] MRN: [id number] Date of Birth: 31-Oct-1922  Clinical Social Work is seeking post-discharge placement for this patient at the Skilled  Nursing Facility level of care (*CSW will initial, date and re-position this form in  chart as items are completed):      Patient/family provided with [facility] Clinical Social Work Department's list of facilities offering this level of care within the geographic area requested by the patient (or if unable, by the patient's family).      Patient/family informed of their freedom to choose among providers that offer the needed level of care, that participate in Medicare, Medicaid or managed care program needed by the patient, have an available bed and are willing to accept the patient.      Patient/family informed of [facility]'s ownership interest in [facility] and [facility], as well as of the fact that they are under no obligation to receive care at these facilities.  PASRR submitted to EDS on       PASRR number received on       Existing PASRR number confirmed on 10/06/15     FL2 transmitted to all facilities in geographic area requested by pt/family on 10/06/15     FL2 transmitted to all facilities within larger geographic area on       Patient informed that his/her managed care company has contracts with or will negotiate with certain facilities, including the following:         YES - Patient/family informed of bed offers received.  Patient chooses bed at  [facility]     Physician recommends and patient chooses bed at      Patient to be transferred to  [facility] on  10/06/15.  Patient to be transferred to facility by  ambulance     Patient family notified on  10/08/15 of transfer.  Name of family member notified:   Patient's husband and son at the bedside     PHYSICIAN Please sign FL2     Additional Comment:    _______________________________________________ Cristobal Goldmann, LCSW 10/08/2015, 2:40 PM

## 2015-10-08 NOTE — Discharge Summary (Signed)
Physician Discharge Summary  John [name] [facility]:[id number] DOB: 01-04-1923 DOA: 10/05/2015  PCP:  Kayser, MD  Admit date: 10/05/2015 Discharge date: 10/08/2015  Time spent: 35 minutes  Recommendations for Outpatient Follow-up:  1. Would recommend Keflex 500 mg PO BID x 10 days for treatment of cellulitis 2. Please follow up on volume status, has a history of CHF, was given IV fluids during this hospitalization for dehydration   3. Has dysphagia, recommend Dysphagia 2 (fine chop) thin liquid diet 4. He was discharged to SNF on 9/29.2017   Discharge Diagnoses:  Principal Problem:   Acute encephalopathy Active Problems:   Diabetes (HCC)   Essential hypertension   Atrial fibrillation (HCC)   CAD (coronary artery disease)   Vascular dementia, uncomplicated   Cellulitis   Altered mental status   Dehydration   Dementia without behavioral disturbance   Palliative care encounter   Goals of care, counseling/discussion   Encounter for hospice care discussion   Regurgitation   Discharge Condition: Stable  Diet recommendation: Dysphagia 2 (fine chop) thin liquid diet  Filed Weights   10/06/15 0500 10/07/15 0339 10/08/15 0501  Weight: 106.4 kg (234 lb 9.6 oz) 107.1 kg (236 lb 3.2 oz) 108 kg (238 lb 3.2 oz)    History of present illness:  John [name] is a demented 82 y.o. male with medical history significant for recent cellulitis of the left leg hypertension, A. fib, CAD, pacemaker vascular dementia CHF presents to the emergency department chief complaint worsening altered mental status. Initial evaluation reveals mild dehydration acute encephalopathy and cellulitis of the left leg.  Information is obtained from the wife and the son who are at the bedside. Currently patient and wife live independently living at [facility] [place]. Wife states over the last 2-3 months patient has gradually worsened in terms of energy stamina appetite sensorium. 2 months ago he had cellulitis of the  left leg and osteomyelitis of toe on left foot status post amputation. He has completed antibiotics and is under the care of a podiatrist. Wife reports a typical day with patient over the last several weeks is mostly sleeping. She reports difficulty awakening him in the morning. He usually gets up around 11 or 12 does manage to walk to the cafeteria but falls asleep at the table. His oral intake is quite diminished. Then goes back to the room takes a nap and remains lethargic. this Morning he fell. Wife reports he did not hit his head or lose consciousness. She states he was trying to sit up in the chair. There has been been no recent fever chills cough diarrhea. No complaints of chest pain abdominal pain no nausea or vomiting. He is under the care of a podiatrist status post amputation of the toe but son reports he has an appointment this week and expects to be released from care  Hospital Course:  John [name] is a 82 year old gentleman with multiple comorbidities including having a of cognitive impairment as well as A. fib, congestive heart failure, status post pacemaker implant, admitted to the medicine service on 10/05/2015 when he presented with a steep functional decline. Family members reporting that he has had a gradual decline over the past 3 months. In the last 24 hours he has become lethargic, minimally arousable, having poor by mouth intake and confusion. He was found to have increased erythema involving his left lower extremity. He has a history of cellulitis and osteomyelitis undergoing amputation of his second toe on left foot. He has been on  previous courses of antimicrobial therapy. He was treated with IV Vancomycin and IV fluids during this hospitalization after which he showed clinical improvement. He was discharged on Keflex.   1.  Functional decline/failure to thrive -John [name] having a significant decline in the past 24 hours, becoming lethargic with minimal by mouth intake, week and  confused. -He has a history of cognitive impairment and had been at an assisted living facility. -Family members report that he has had a progressive decline over the past several months -Steep decline in the last 24 hours may have been related to development of left lower extremity cellulitis, as on physical examination he was found to have erythema, localized warmth and swelling of left leg. -Started empiric IV antibiotic therapy with vancomycin. Will also provide IV fluids.  -On 09/29/2015 patient showing clinical improvement. He was assisted out of bed and ambulated down the hallway with physical therapy -He was discharged to SNF on 10/08/2015  2.  Cellulitis -He has a history of cellulitis along with osteomyelitis which led to amputation of second toe of left foot. -Family members reporting that symptoms significantly improved while on antibiotics, however swelling and erythema had come back since their discontinuation -Plan to cover with IV vancomycin. On physical examination did not have fluctuant masses or purulent discharge -On 10/07/2015 there is improvement to cellulitis on physical examination plan to continue IV vancomycin -Will discharge on Keflex 500 mg PO BID for 10 days.   3.  History of atrial fibrillation -CHADSVasc score of 4 -Currently not anticoagulated, having history of subdural hematoma -Rate controlled -Continue Coreg 6.25 mg by mouth twice a day along with amiodarone 200 mg by mouth daily  4.  Hypertension -Blood pressure stable, continue Coreg 6.25 mg by mouth twice a day  5.  History of chronic systolic congestive heart failure -His last transthoracic echocardiogram was performed 2011 which revealed an ejection fraction of 40-45% -He appears dry and physical examination, providing gentle IV fluid hydration as he is not taking by mouth, monitor volume status closely  6.  Dysphagia -On his swallowing evaluation today he was noted to have coughing and emesis  after a trial of by mouth. -SLP feeling that there is likely esophageal dysphagia -Barium swallow showing mild stenosis. By 10/08/2015 he was tolerating soft diet without evidence of aspiration or choking.  -Spoke with family, would like to see how he does with soft diet   Procedures: Barium Swallow IMPRESSION: Mild distal esophageal stricture, likely peptic in the setting of small hiatal hernia. A 13 mm barium tablet readily traversed the esophagus.  Consultations:  Palliative Medicine  Discharge Exam: Vitals:   10/08/15 0501 10/08/15 1326  BP: 133/75 (!) 141/82  Pulse: 70 70  Resp: 18 16  Temp: 98.5 F (36.9 C) 98 F (36.7 C)    General exam: Looks better, sitting at bedside chair Respiratory system: Clear to auscultation. Respiratory effort normal. Cardiovascular system: S1 & S2 heard, irregular rate and rhythm. No JVD, murmurs, rubs, gallops or clicks. No pedal edema. Gastrointestinal system: Abdomen is nondistended, soft and nontender. No organomegaly or masses felt. Normal bowel sounds heard. Central nervous system:  No focal neurological deficits. Extremities: Symmetric 5 x 5 power. Skin: Interim improvement to erythema Psychiatry: More awake and alert, less lethargic   Discharge Instructions   Discharge Instructions    Call MD for:    Complete by:  As directed    Call MD for:  difficulty breathing, headache or visual disturbances  Complete by:  As directed    Call MD for:  extreme fatigue    Complete by:  As directed    Call MD for:  hives    Complete by:  As directed    Call MD for:  persistant dizziness or light-headedness    Complete by:  As directed    Call MD for:  persistant nausea and vomiting    Complete by:  As directed    Call MD for:  redness, tenderness, or signs of infection (pain, swelling, redness, odor or green/yellow discharge around incision site)    Complete by:  As directed    Call MD for:  severe uncontrolled pain    Complete by:   As directed    Call MD for:  temperature >100.4    Complete by:  As directed    Increase activity slowly    Complete by:  As directed      Current Discharge Medication List    START taking these medications   Details  cephALEXin (KEFLEX) 500 MG capsule Take 1 capsule (500 mg total) by mouth every 12 (twelve) hours. Qty: 20 capsule, Refills: 0      CONTINUE these medications which have NOT CHANGED   Details  acidophilus (RISAQUAD) CAPS capsule Take 2 capsules by mouth daily.    amiodarone (PACERONE) 200 MG tablet Take 200 mg by mouth See admin instructions. Pt only takes on Sunday, Monday, Wednesday, Thursday, and Friday.    aspirin EC 81 MG tablet Take 81 mg by mouth at bedtime.    carvedilol (COREG) 6.25 MG tablet Take 1 tablet (6.25 mg total) by mouth 2 (two) times daily with a meal. Qty: 60 tablet, Refills: 11    cholecalciferol (VITAMIN D) 1000 units tablet Take 1,000 Units by mouth daily.    escitalopram (LEXAPRO) 10 MG tablet Take 10 mg by mouth daily. Refills: 11    meclizine (ANTIVERT) 25 MG tablet Take 12.5-25 mg by mouth 4 (four) times daily as needed for dizziness. Refills: 0    Multiple Vitamins-Minerals (PRESERVISION AREDS 2) CAPS Take 1 capsule by mouth 2 (two) times daily.    NAMENDA XR 28 MG CP24 24 hr capsule Take 28 mg by mouth daily.  Refills: 5    pravastatin (PRAVACHOL) 40 MG tablet Take 40 mg by mouth at bedtime.     rivastigmine (EXELON) 9.5 mg/24hr Place 1 patch onto the skin at bedtime.  Refills: 1    triamcinolone cream (KENALOG) 0.1 % Apply 1 application topically 2 (two) times daily as needed. legs Refills: 2       Allergies  Allergen Reactions  . Metronidazole     Gi upsett   Follow-up Information    PERINI,MARK A, MD Follow up in 1 week(s).   Specialty:  Internal Medicine Contact information: [address] [place] [address] [phone]            The results of significant diagnostics from this hospitalization  (including imaging, microbiology, ancillary and laboratory) are listed below for reference.    Significant Diagnostic Studies: Dg Chest 2 View  Result Date: 10/05/2015 CLINICAL DATA:  Weakness and decreased appetite. Status post fall. Altered mental status. EXAM: CHEST  2 VIEW COMPARISON:  Chest radiograph 04/30/2013 FINDINGS: Left chest wall biventricular pacemaker/ AICD with leads in expected location overlying the left and right ventricles. Median sternotomy wires and CABG markers are unchanged. There is aortic atherosclerosis. Cardiomediastinal contours are unchanged. No pneumothorax or sizable pleural effusion. No focal  airspace consolidation. Mild pulmonary vascular congestion without overt pulmonary edema. Multilevel thoracic vertebral body osteophytosis and height loss. IMPRESSION: 1. No radiographic evidence of pneumonia. 2. Central pulmonary vascular congestion without overt pulmonary edema. 3. Aortic atherosclerosis. Electronically Signed   By: Deatra Robinson M.D.   On: 10/05/2015 13:57   Ct Head Wo Contrast  Result Date: 10/05/2015 CLINICAL DATA:  Altered mental status EXAM: CT HEAD WITHOUT CONTRAST TECHNIQUE: Contiguous axial images were obtained from the base of the skull through the vertex without intravenous contrast. COMPARISON:  08/28/2015 FINDINGS: Brain: No evidence of acute infarction, hemorrhage, hydrocephalus, extra-axial collection or mass lesion/mass effect. Asymmetric right cerebral atrophy with infarct related gliosis and encephalomalacia in all vascular territories. Small-vessel ischemic changes in the cerebral white matter and basal ganglia. Vascular: Diffuse atherosclerotic calcification. Chronic collapsed appearance of the right cervical ICA. No right ICA flow void on 09/26/2015 brain MRI. Skull: No acute finding.  Remote left parietal craniotomy. Sinuses/Orbits: Bilateral cataract resection. Mild chronic sinusitis. No acute finding. IMPRESSION: 1. No acute finding. 2.  Extensive chronic ischemic injury, especially in the right cerebral hemisphere in the setting of chronic right ICA occlusion. Electronically Signed   By: Marnee Spring M.D.   On: 10/05/2015 14:12   Dg Esophagus  Result Date: 10/08/2015 CLINICAL DATA:  Vomiting and difficulty swallowing. EXAM: ESOPHOGRAM/BARIUM SWALLOW TECHNIQUE: Single contrast examination was performed using  thin barium. FLUOROSCOPY TIME:  Fluoroscopy Time:  1.3 minutes Radiation Exposure Index (if provided by the fluoroscopic device): 9.8 mGy air kerma Number of Acquired Spot Images: 0 COMPARISON:  None. FINDINGS: Limited exam as patient could only take a few swallows and could not stand. Patient was positioned LPO, semi recumbent. No aspiration or pharyngeal obstruction was noted. Smooth mild stricturing at the distal esophagus with small sliding hiatal hernia. A 13 mm barium tablet (which was given to the patient while seated) traversed the narrowing and stayed in the small hernia sac. No reflux was noted during the exam. IMPRESSION: Mild distal esophageal stricture, likely peptic in the setting of small hiatal hernia. A 13 mm barium tablet readily traversed the esophagus. Electronically Signed   By: Marnee Spring M.D.   On: 10/08/2015 09:25    Microbiology: No results found for this or any previous visit (from the past 240 hour(s)).   Labs: Basic Metabolic Panel:  Recent Labs Lab 10/05/15 1245 10/06/15 0514 10/07/15 0450 10/08/15 0521  NA 141 143 140 140  K 4.1 3.8 3.5 3.6  CL 110 106 108 109  CO2 24 25 28 28   GLUCOSE 114* 125* 89 115*  BUN 11 10 8 9   CREATININE 1.11 0.98 0.85 0.90  CALCIUM 8.8* 9.2 8.7* 8.6*  MG  --  1.9  --   --    Liver Function Tests:  Recent Labs Lab 10/05/15 1245  AST 63*  ALT 38  ALKPHOS 131*  BILITOT 2.1*  PROT 6.6  ALBUMIN 3.1*   No results for input(s): LIPASE, AMYLASE in the last 168 hours.  Recent Labs Lab 10/05/15 1923  AMMONIA 20   CBC:  Recent Labs Lab  10/05/15 1245 10/06/15 0514 10/07/15 0450 10/08/15 0521  WBC 5.1 5.5 3.5* 4.5  HGB 11.2* 12.1* 11.2* 10.9*  HCT 35.8* 39.0 37.1* 35.7*  MCV 101.1* 101.6* 102.5* 101.7*  PLT 163 148* 138* 162   Cardiac Enzymes: No results for input(s): CKTOTAL, CKMB, CKMBINDEX, TROPONINI in the last 168 hours. BNP: BNP (last 3 results) No results for input(s): BNP in the last  8760 hours.  ProBNP (last 3 results) No results for input(s): PROBNP in the last 8760 hours.  CBG:  Recent Labs Lab 10/07/15 1940 10/07/15 2359 10/08/15 0427 10/08/15 0847 10/08/15 1158  GLUCAP 132* 157* 113* 110* 141*       Signed:  Jeralyn Bennett MD.  [facility] 10/08/2015, 2:09 PM

## 2015-10-10 DIAGNOSIS — L03116 Cellulitis of left lower limb: Secondary | ICD-10-CM

## 2015-10-11 ENCOUNTER — Ambulatory Visit: Payer: Medicare Other | Admitting: Podiatry

## 2015-11-10 DEATH — deceased
